# Patient Record
Sex: Male | Born: 1940
Health system: Southern US, Community
[De-identification: ages and names within clinical notes are randomized; demographics above are authoritative.]

## PROBLEM LIST (undated history)

## (undated) DIAGNOSIS — I351 Nonrheumatic aortic (valve) insufficiency: Secondary | ICD-10-CM

## (undated) DIAGNOSIS — K219 Gastro-esophageal reflux disease without esophagitis: Secondary | ICD-10-CM

## (undated) DIAGNOSIS — I10 Essential (primary) hypertension: Secondary | ICD-10-CM

## (undated) DIAGNOSIS — I4891 Unspecified atrial fibrillation: Secondary | ICD-10-CM

## (undated) DIAGNOSIS — E7800 Pure hypercholesterolemia, unspecified: Secondary | ICD-10-CM

## (undated) DIAGNOSIS — I471 Supraventricular tachycardia, unspecified: Secondary | ICD-10-CM

## (undated) DIAGNOSIS — I340 Nonrheumatic mitral (valve) insufficiency: Secondary | ICD-10-CM

## (undated) DIAGNOSIS — I499 Cardiac arrhythmia, unspecified: Secondary | ICD-10-CM

## (undated) DIAGNOSIS — K5790 Diverticulosis of intestine, part unspecified, without perforation or abscess without bleeding: Secondary | ICD-10-CM

## (undated) DIAGNOSIS — C61 Malignant neoplasm of prostate: Secondary | ICD-10-CM

## (undated) DIAGNOSIS — I517 Cardiomegaly: Secondary | ICD-10-CM

## (undated) DIAGNOSIS — I493 Ventricular premature depolarization: Secondary | ICD-10-CM

## (undated) DIAGNOSIS — M4800 Spinal stenosis, site unspecified: Secondary | ICD-10-CM

## (undated) HISTORY — PX: RETROPUBIC PROSTATECTOMY: SUR1055

## (undated) HISTORY — DX: Malignant neoplasm of prostate: C61

## (undated) HISTORY — PX: LUMBAR FUSION: SHX111

## (undated) HISTORY — DX: Pure hypercholesterolemia, unspecified: E78.00

## (undated) HISTORY — DX: Essential (primary) hypertension: I10

## (undated) HISTORY — DX: Diverticulosis of intestine, part unspecified, without perforation or abscess without bleeding: K57.90

## (undated) HISTORY — PX: OTHER SURGICAL HISTORY: SHX169

## (undated) HISTORY — DX: Gastro-esophageal reflux disease without esophagitis: K21.9

---
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
## 1967-09-02 HISTORY — PX: OTHER SURGICAL HISTORY: SHX169

## 2012-06-30 ENCOUNTER — Other Ambulatory Visit: Payer: Self-pay | Admitting: Internal Medicine

## 2012-06-30 ENCOUNTER — Other Ambulatory Visit: Payer: Self-pay | Admitting: *Deleted

## 2012-07-02 ENCOUNTER — Other Ambulatory Visit: Payer: Self-pay | Admitting: *Deleted

## 2012-07-02 MED ORDER — LISINOPRIL 20 MG PO TABS: 20.0000 mg | ORAL_TABLET | Freq: Every day | ORAL | Status: DC

## 2012-07-02 NOTE — Telephone Encounter (Signed)
Confirm with pharmacy - rx had been called in to pharmacy. (#30 with one refill)

## 2012-07-02 NOTE — Telephone Encounter (Signed)
2nd request for lisinopril 20 mg tab Sig: take one tablet each day  Appt. In Dec.

## 2012-08-20 ENCOUNTER — Encounter: Payer: Self-pay | Admitting: Internal Medicine

## 2012-08-20 ENCOUNTER — Ambulatory Visit (INDEPENDENT_AMBULATORY_CARE_PROVIDER_SITE_OTHER): Payer: BC Managed Care – PPO | Admitting: Internal Medicine

## 2012-08-20 VITALS — BP 120/68 | HR 60 | Temp 98.4°F | Ht 76.0 in | Wt 257.2 lb

## 2012-08-20 DIAGNOSIS — K219 Gastro-esophageal reflux disease without esophagitis: Secondary | ICD-10-CM

## 2012-08-20 DIAGNOSIS — C61 Malignant neoplasm of prostate: Secondary | ICD-10-CM

## 2012-08-20 DIAGNOSIS — E7800 Pure hypercholesterolemia, unspecified: Secondary | ICD-10-CM

## 2012-08-20 DIAGNOSIS — I10 Essential (primary) hypertension: Secondary | ICD-10-CM

## 2012-08-20 DIAGNOSIS — Z8546 Personal history of malignant neoplasm of prostate: Secondary | ICD-10-CM | POA: Insufficient documentation

## 2012-08-20 DIAGNOSIS — R5381 Other malaise: Secondary | ICD-10-CM

## 2012-08-20 DIAGNOSIS — R5383 Other fatigue: Secondary | ICD-10-CM

## 2012-08-20 NOTE — Patient Instructions (Addendum)
It was nice seeing you today.  I am glad you have been doing well.  Let me know if you need anything. 

## 2012-08-21 ENCOUNTER — Encounter: Payer: Self-pay | Admitting: Internal Medicine

## 2012-08-21 NOTE — Assessment & Plan Note (Signed)
S/p retropubic prostatectomy.  Follow PSA.

## 2012-08-21 NOTE — Assessment & Plan Note (Signed)
Blood pressure doing well.  Same meds.  Follow pressures.  Check metabolic panel with next labs.

## 2012-08-21 NOTE — Progress Notes (Signed)
  Subjective:    Patient ID: Edward James, male    DOB: March 24, 1941, 72 y.o.   MRN: [id number]  HPI 71 year old male with past history of prostate cancer s/p radical retropubic prostatectomy, hypercholesterolemia and hypertension who comes in today to follow up on these issues as well as for a complete physical exam.  He states he is doing well.  No chest pain or tightness.  No sob.  Taking his meds regularly.  On protonix and align and having no GI issues.  Bowels normal.  No acid reflux.   Past Medical History  Diagnosis Date  . Prostate cancer     s/p radical retropubic prostatectomy 1999  . Hypertension   . Hypercholesterolemia   . GERD (gastroesophageal reflux disease)   . Diverticulosis     Current Outpatient Prescriptions on File Prior to Visit  Medication Sig Dispense Refill  . lisinopril (PRINIVIL,ZESTRIL) 20 MG tablet Take 1 tablet (20 mg total) by mouth daily.  30 tablet  1  . pantoprazole (PROTONIX) 40 MG tablet Take 40 mg by mouth daily.      . simvastatin (ZOCOR) 10 MG tablet Take 10 mg by mouth at bedtime.      . triamterene-hydrochlorothiazide (MAXZIDE-25) 37.5-25 MG per tablet Take 1/2 tablet daily        Review of Systems Patient denies any headache, lightheadedness or dizziness.  No chest pain, tightness or palpitations.  No increased shortness of breath, cough or congestion.  No nausea or vomiting.  No acid reflux.  No abdominal pain or cramping.  No bowel change, such as diarrhea, constipation, BRBPR or melana.  No urine change.        Objective:   Physical Exam Filed Vitals:   08/20/12 1017  BP: 120/68  Pulse: 60  Temp: 98.4 F (48.45 C)   71 year old male in no acute distress.  HEENT:  Nares - clear.  Oropharynx - without lesions. NECK:  Supple.  Nontender.  No audible carotid bruit.  HEART:  Appears to be regular.  I/VI systolic murmur.  LUNGS:  No crackles or wheezing audible.  Respirations even and unlabored.   RADIAL PULSE:  Equal bilaterally.   ABDOMEN:  Soft.  Nontender.  Bowel sounds present and normal.  No audible abdominal bruit.  GU:  Normal descended testicles.  No palpable testicular nodules.  Hernia unchanged.  RECTAL:   Heme negative.   EXTREMITIES:  No increased edema present.  DP pulses palpable and equal bilaterally.          Assessment & Plan:  CARDIOVASCULAR.  Currently asymptomatic.    GI.  Colonoscopy 02/21/08 revealed diverticulosis.  Recommended follow up in 10 years. IFOB - given.    HERNIA.  Saw Dr Lonna Cobb.  Decided - observation only.  No pain or problems.  Follow.   HEALTH MAINTENANCE.  Physical today.  Check PSA with next labs.  Last PSA .64 - 04/22/12.  Colonoscopy as outlined.

## 2012-08-21 NOTE — Assessment & Plan Note (Signed)
Lipid profile 04/22/12 revealed total cholesterol 135, HDL 41, LDL 78 and triglycerides 78.  Continue current med regimen.  Low cholesterol diet and exercise.  Follow.

## 2012-08-21 NOTE — Assessment & Plan Note (Signed)
On protonix.  Currently asymptomatic.    

## 2012-10-05 ENCOUNTER — Ambulatory Visit (INDEPENDENT_AMBULATORY_CARE_PROVIDER_SITE_OTHER): Payer: Medicare Other | Admitting: Adult Health

## 2012-10-05 ENCOUNTER — Encounter: Payer: Self-pay | Admitting: Adult Health

## 2012-10-05 ENCOUNTER — Telehealth: Payer: Self-pay | Admitting: Internal Medicine

## 2012-10-05 VITALS — BP 124/74 | HR 68 | Temp 98.2°F | Resp 14 | Wt 256.0 lb

## 2012-10-05 DIAGNOSIS — J40 Bronchitis, not specified as acute or chronic: Secondary | ICD-10-CM

## 2012-10-05 MED ORDER — ALBUTEROL SULFATE HFA 108 (90 BASE) MCG/ACT IN AERS: 2.0000 | INHALATION_SPRAY | Freq: Four times a day (QID) | RESPIRATORY_TRACT | Status: DC | PRN

## 2012-10-05 MED ORDER — FLUTICASONE PROPIONATE 50 MCG/ACT NA SUSP: 2.0000 | Freq: Every day | NASAL | Status: DC

## 2012-10-05 MED ORDER — AZITHROMYCIN 250 MG PO TABS: ORAL_TABLET | ORAL | Status: DC

## 2012-10-05 NOTE — Patient Instructions (Addendum)
  Please start your antibiotic today. You will take this for a total of 5 days.  Also start the flonase nasal spray. Two sprays in each nostril daily.  Start the albuterol inhaler - 2 puffs into the lungs every 6 hours as needed for wheezing, cough or shortness of breath.  Continue the cough syrup you have been taking.  If you are not feeling any better within 3-4 days please call the office.

## 2012-10-05 NOTE — Progress Notes (Signed)
  Subjective:    Patient ID: Edward James, male    DOB: 05/18/1941, 72 y.o.   MRN: [id number]  HPI  Patient is a pleasant 72 y/o male with 7 day hx of sinus pressure, drainage, yellow mucus, wheezing, coughing. He has had chills and low grade fever. Pt has tried Mucinex bid and OTC cough syrup with mild relief. He uses saline nasal spray. He denies CP, shortness of breath.   Current Outpatient Prescriptions on File Prior to Visit  Medication Sig Dispense Refill  . aspirin 81 MG tablet Take 81 mg by mouth daily.      Marland Kitchen lisinopril (PRINIVIL,ZESTRIL) 20 MG tablet Take 1 tablet (20 mg total) by mouth daily.  30 tablet  1  . simvastatin (ZOCOR) 10 MG tablet Take 10 mg by mouth at bedtime.      . triamterene-hydrochlorothiazide (MAXZIDE-25) 37.5-25 MG per tablet Take 1/2 tablet daily      . albuterol (PROVENTIL HFA;VENTOLIN HFA) 108 (90 BASE) MCG/ACT inhaler Inhale 2 puffs into the lungs every 6 (six) hours as needed for wheezing.  1 Inhaler  0      Review of Systems  Constitutional: Positive for fever and chills. Negative for appetite change.  HENT: Positive for sore throat, rhinorrhea, postnasal drip and sinus pressure.   Respiratory: Positive for cough and wheezing.   Cardiovascular: Negative for chest pain.  Gastrointestinal: Negative.   Genitourinary: Negative.   Neurological: Negative.   Psychiatric/Behavioral: Negative.    BP 124/74  Pulse 68  Temp 98.2 F (36.8 C) (Oral)  Resp 14  Wt 256 lb (116.121 kg)  SpO2 96%     Objective:   Physical Exam  Constitutional: He is oriented to person, place, and time. He appears well-developed and well-nourished. No distress.  Cardiovascular: Normal rate, regular rhythm and normal heart sounds.   No murmur heard. Pulmonary/Chest: Effort normal. He has wheezes.       Bilateral rhonchi does not clear with coughing.  Abdominal: Soft. Bowel sounds are normal.  Lymphadenopathy:    He has no cervical adenopathy.  Neurological: He is alert  and oriented to person, place, and time.  Skin: Skin is warm and dry.  Psychiatric: He has a normal mood and affect. His behavior is normal. Thought content normal.       Assessment & Plan:

## 2012-10-05 NOTE — Assessment & Plan Note (Addendum)
Start Azithromycin and albuterol inhaler. Also ordered flonase. Continue with OTC cough medication and saline spray. RTC if not improvement within 3-4 days.

## 2012-10-05 NOTE — Telephone Encounter (Signed)
Patient was worked in today at 4:00pm with Raquel.

## 2012-10-05 NOTE — Telephone Encounter (Signed)
Patient Information:  Caller Name: Edward James  Phone: [phone]  Patient: Edward James, Edward James  Gender: Male  DOB: 20-Sep-1940  Age: 72 Years  PCP: [facility]  Office Follow Up:  Does the office need to follow up with this patient?: No  Instructions For The Office: N/A   Symptoms  Reason For Call & Symptoms: Pt is calling for an appt for congestion/sinus drainage. Onset 1 week ago. Pt was hoping MD would call in abx. RN advised appt.  Reviewed Health History In EMR: Yes  Reviewed Medications In EMR: Yes  Reviewed Allergies In EMR: Yes  Reviewed Surgeries / Procedures: Yes  Date of Onset of Symptoms: 09/28/2012  Guideline(s) Used:  Sinus Pain and Congestion  Disposition Per Guideline:   See Today or Tomorrow in Office  Reason For Disposition Reached:   Using nasal washes and pain medicine > 24 hours and sinus pain (lower forehead, cheekbone, or eye) persists  Advice Given:  N/A  Appointment Scheduled:  10/05/2012 16:00:00 Appointment Scheduled Provider:  Orville Govern

## 2012-10-21 ENCOUNTER — Other Ambulatory Visit (INDEPENDENT_AMBULATORY_CARE_PROVIDER_SITE_OTHER): Payer: Medicare Other

## 2012-10-21 DIAGNOSIS — I10 Essential (primary) hypertension: Secondary | ICD-10-CM

## 2012-10-21 DIAGNOSIS — E7800 Pure hypercholesterolemia, unspecified: Secondary | ICD-10-CM

## 2012-10-21 DIAGNOSIS — R5383 Other fatigue: Secondary | ICD-10-CM

## 2012-10-21 DIAGNOSIS — C61 Malignant neoplasm of prostate: Secondary | ICD-10-CM

## 2012-10-21 LAB — LIPID PANEL
HDL: 40.6 mg/dL (ref >39.00)
Total CHOL/HDL Ratio: 3
Triglycerides: 62 mg/dL (ref 0.0–149.0)
VLDL: 12.4 mg/dL (ref 0.0–40.0)

## 2012-10-21 LAB — CBC WITH DIFFERENTIAL/PLATELET
Basophils Relative: 0.7 % (ref 0.0–3.0)
Eosinophils Relative: 1.6 % (ref 0.0–5.0)
Lymphocytes Relative: 47.2 % — ABNORMAL HIGH (ref 12.0–46.0)
Neutrophils Relative %: 42.3 % — ABNORMAL LOW (ref 43.0–77.0)
Platelets: 231 10*3/uL (ref 150.0–400.0)
RBC: 5.09 Mil/uL (ref 4.22–5.81)
WBC: 5.9 10*3/uL (ref 4.5–10.5)

## 2012-10-21 LAB — HEPATIC FUNCTION PANEL
ALT: 23 U/L (ref 0–53)
AST: 22 U/L (ref 0–37)
Albumin: 3.9 g/dL (ref 3.5–5.2)
Total Bilirubin: 1.1 mg/dL (ref 0.3–1.2)

## 2012-10-21 LAB — BASIC METABOLIC PANEL
BUN: 17 mg/dL (ref 6–23)
Chloride: 107 mEq/L (ref 96–112)
Glucose, Bld: 92 mg/dL (ref 70–99)
Potassium: 4.8 mEq/L (ref 3.5–5.1)
Sodium: 140 mEq/L (ref 135–145)

## 2012-10-22 LAB — PSA, MEDICARE: PSA: 0.38 ng/ml (ref 0.10–4.00)

## 2012-10-22 LAB — TSH: TSH: 2.12 u[IU]/mL (ref 0.35–5.50)

## 2012-10-25 ENCOUNTER — Telehealth: Payer: Self-pay | Admitting: Internal Medicine

## 2012-10-25 NOTE — Telephone Encounter (Signed)
Pt notified of lab results via mychart. 

## 2012-10-28 ENCOUNTER — Encounter: Payer: Self-pay | Admitting: Internal Medicine

## 2012-10-28 ENCOUNTER — Ambulatory Visit (INDEPENDENT_AMBULATORY_CARE_PROVIDER_SITE_OTHER): Payer: Medicare Other | Admitting: Internal Medicine

## 2012-10-28 VITALS — BP 124/70 | HR 62 | Temp 97.7°F | Ht 76.0 in | Wt 256.5 lb

## 2012-10-28 DIAGNOSIS — E7800 Pure hypercholesterolemia, unspecified: Secondary | ICD-10-CM

## 2012-10-28 DIAGNOSIS — I10 Essential (primary) hypertension: Secondary | ICD-10-CM

## 2012-10-28 DIAGNOSIS — C61 Malignant neoplasm of prostate: Secondary | ICD-10-CM

## 2012-10-28 DIAGNOSIS — K219 Gastro-esophageal reflux disease without esophagitis: Secondary | ICD-10-CM

## 2012-11-01 ENCOUNTER — Encounter: Payer: Self-pay | Admitting: Internal Medicine

## 2012-11-01 NOTE — Progress Notes (Signed)
Subjective:    Patient ID: Edward James, male    DOB: June 20, 1941, 72 y.o.   MRN: [id number]  HPI 72 year old male with past history of prostate cancer s/p radical retropubic prostatectomy, hypercholesterolemia and hypertension who comes in today for a scheduled follow up.  He states he is doing well.  No chest pain or tightness.  No sob.  Taking his meds regularly.  On protonix and align and having no GI issues.  Bowels normal.  No acid reflux.   Past Medical History  Diagnosis Date  . Prostate cancer     s/p radical retropubic prostatectomy 1999  . Hypertension   . Hypercholesterolemia   . GERD (gastroesophageal reflux disease)   . Diverticulosis     Current Outpatient Prescriptions on File Prior to Visit  Medication Sig Dispense Refill  . aspirin 81 MG tablet Take 81 mg by mouth daily.      Marland Kitchen azithromycin (ZITHROMAX) 250 MG tablet Take 2 tablets on the first day then take 1 tablet daily for the next 4 days.  6 tablet  0  . fluticasone (FLONASE) 50 MCG/ACT nasal spray Place 2 sprays into the nose daily.  16 g  6  . lisinopril (PRINIVIL,ZESTRIL) 20 MG tablet Take 1 tablet (20 mg total) by mouth daily.  30 tablet  1  . pantoprazole (PROTONIX) 40 MG tablet Take 40 mg by mouth daily.      . simvastatin (ZOCOR) 10 MG tablet Take 10 mg by mouth at bedtime.      . triamterene-hydrochlorothiazide (MAXZIDE-25) 37.5-25 MG per tablet Take 1/2 tablet daily      . albuterol (PROVENTIL HFA;VENTOLIN HFA) 108 (90 BASE) MCG/ACT inhaler Inhale 2 puffs into the lungs every 6 (six) hours as needed for wheezing.  1 Inhaler  0  . dextromethorphan (COUGH DM) 30 MG/5ML liquid Take 60 mg by mouth as needed.      Marland Kitchen guaiFENesin (MUCUS RELIEF ER) 600 MG 12 hr tablet Take 600 mg by mouth 2 (two) times daily.       No current facility-administered medications on file prior to visit.    Review of Systems Patient denies any headache, lightheadedness or dizziness.  No chest pain, tightness or palpitations.  No  increased shortness of breath, cough or congestion.  Previous congestion symptoms resolved. No nausea or vomiting.  No acid reflux.  No abdominal pain or cramping.  No bowel change, such as diarrhea, constipation, BRBPR or melana.  No urine change.        Objective:   Physical Exam  Filed Vitals:   10/28/12 1404  BP: 124/70  Pulse: 62  Temp: 97.7 F (2.110 C)   72 year old male in no acute distress.  HEENT:  Nares - clear.  Oropharynx - without lesions. NECK:  Supple.  Nontender.  No audible carotid bruit.  HEART:  Appears to be regular.  I/VI systolic murmur.  LUNGS:  No crackles or wheezing audible.  Respirations even and unlabored.   RADIAL PULSE:  Equal bilaterally.  ABDOMEN:  Soft.  Nontender.  Bowel sounds present and normal.  No audible abdominal bruit.   EXTREMITIES:  No increased edema present.  DP pulses palpable and equal bilaterally.          Assessment & Plan:  CARDIOVASCULAR.  Currently asymptomatic.    GI.  Colonoscopy 02/21/08 revealed diverticulosis.  Recommended follow up in 10 years.   HERNIA.  Saw Dr Lonna Cobb.  Decided - observation only.  No pain  or problems.  Follow.   HEALTH MAINTENANCE.  Physical last visit.  Last PSA  .38 - 10/21/12..  Colonoscopy as outlined.

## 2012-11-01 NOTE — Assessment & Plan Note (Signed)
Lipid profile 10/21/12 revealed total cholesterol 133, HDL 40, LDL 80 and triglycerides 62.  Continue current med regimen.  Low cholesterol diet and exercise.  Follow.

## 2012-11-01 NOTE — Assessment & Plan Note (Signed)
Blood pressure doing well.  Same meds.  Follow pressures.  Follow metabolic panel.   

## 2012-11-01 NOTE — Assessment & Plan Note (Signed)
On protonix.  Currently asymptomatic.    

## 2012-11-01 NOTE — Assessment & Plan Note (Signed)
S/p retropubic prostatectomy.   PSA 10/21/12 - .38.

## 2013-02-18 ENCOUNTER — Ambulatory Visit: Payer: BC Managed Care – PPO | Admitting: Internal Medicine

## 2013-03-14 ENCOUNTER — Other Ambulatory Visit: Payer: Self-pay | Admitting: Internal Medicine

## 2013-03-29 ENCOUNTER — Other Ambulatory Visit: Payer: Self-pay | Admitting: *Deleted

## 2013-03-29 MED ORDER — PANTOPRAZOLE SODIUM 40 MG PO TBEC: 40.0000 mg | DELAYED_RELEASE_TABLET | Freq: Every day | ORAL | Status: DC

## 2013-04-06 ENCOUNTER — Other Ambulatory Visit: Payer: Self-pay

## 2013-04-20 ENCOUNTER — Other Ambulatory Visit (INDEPENDENT_AMBULATORY_CARE_PROVIDER_SITE_OTHER): Payer: Medicare Other

## 2013-04-20 DIAGNOSIS — I10 Essential (primary) hypertension: Secondary | ICD-10-CM

## 2013-04-20 DIAGNOSIS — E7800 Pure hypercholesterolemia, unspecified: Secondary | ICD-10-CM

## 2013-04-20 LAB — BASIC METABOLIC PANEL
CO2: 29 mEq/L (ref 19–32)
Calcium: 9.4 mg/dL (ref 8.4–10.5)
Chloride: 106 mEq/L (ref 96–112)
Glucose, Bld: 90 mg/dL (ref 70–99)
Potassium: 4.6 mEq/L (ref 3.5–5.1)
Sodium: 137 mEq/L (ref 135–145)

## 2013-04-20 LAB — HEPATIC FUNCTION PANEL
ALT: 24 U/L (ref 0–53)
Albumin: 4 g/dL (ref 3.5–5.2)
Alkaline Phosphatase: 48 U/L (ref 39–117)
Bilirubin, Direct: 0.2 mg/dL (ref 0.0–0.3)
Total Protein: 6.6 g/dL (ref 6.0–8.3)

## 2013-04-20 LAB — LIPID PANEL
Cholesterol: 140 mg/dL (ref 0–200)
LDL Cholesterol: 82 mg/dL (ref 0–99)
Triglycerides: 95 mg/dL (ref 0.0–149.0)
VLDL: 19 mg/dL (ref 0.0–40.0)

## 2013-04-25 ENCOUNTER — Other Ambulatory Visit: Payer: Medicare Other

## 2013-04-28 ENCOUNTER — Encounter: Payer: Self-pay | Admitting: Internal Medicine

## 2013-04-28 ENCOUNTER — Ambulatory Visit (INDEPENDENT_AMBULATORY_CARE_PROVIDER_SITE_OTHER): Payer: Medicare Other | Admitting: Internal Medicine

## 2013-04-28 VITALS — BP 120/70 | HR 63 | Temp 97.6°F | Ht 76.0 in | Wt 254.5 lb

## 2013-04-28 DIAGNOSIS — K219 Gastro-esophageal reflux disease without esophagitis: Secondary | ICD-10-CM

## 2013-04-28 DIAGNOSIS — E7800 Pure hypercholesterolemia, unspecified: Secondary | ICD-10-CM

## 2013-04-28 DIAGNOSIS — C61 Malignant neoplasm of prostate: Secondary | ICD-10-CM

## 2013-04-28 DIAGNOSIS — I10 Essential (primary) hypertension: Secondary | ICD-10-CM

## 2013-04-28 NOTE — Progress Notes (Signed)
  Subjective:    Patient ID: Edward James, male    DOB: 04/28/1941, 72 y.o.   MRN: [id number]  HPI 72 year old male with past history of prostate cancer s/p radical retropubic prostatectomy, hypercholesterolemia and hypertension who comes in today for a scheduled follow up.  He states he is doing well.  No chest pain or tightness.  No sob.  Taking his meds regularly.  On protonix and align and having no GI issues.  Bowels normal.  No acid reflux.    Past Medical History  Diagnosis Date  . Prostate cancer     s/p radical retropubic prostatectomy 1999  . Hypertension   . Hypercholesterolemia   . GERD (gastroesophageal reflux disease)   . Diverticulosis     Current Outpatient Prescriptions on File Prior to Visit  Medication Sig Dispense Refill  . albuterol (PROVENTIL HFA;VENTOLIN HFA) 108 (90 BASE) MCG/ACT inhaler Inhale 2 puffs into the lungs every 6 (six) hours as needed for wheezing.  1 Inhaler  0  . aspirin 81 MG tablet Take 81 mg by mouth daily.      . fluticasone (FLONASE) 50 MCG/ACT nasal spray Place 2 sprays into the nose daily.  16 g  6  . lisinopril (PRINIVIL,ZESTRIL) 20 MG tablet Take 1 tablet (20 mg total) by mouth daily.  30 tablet  1  . pantoprazole (PROTONIX) 40 MG tablet Take 1 tablet (40 mg total) by mouth daily.  30 tablet  5  . simvastatin (ZOCOR) 10 MG tablet Take 10 mg by mouth at bedtime.      . triamterene-hydrochlorothiazide (MAXZIDE-25) 37.5-25 MG per tablet TAKE ONE-HALF TABLET BY MOUTH DAILY  51 tablet  3   No current facility-administered medications on file prior to visit.    Review of Systems Patient denies any headache, lightheadedness or dizziness.  No chest pain, tightness or palpitations.  No increased shortness of breath, cough or congestion.   No nausea or vomiting.  No acid reflux.  No abdominal pain or cramping.  No bowel change, such as diarrhea, constipation, BRBPR or melana.  No urine change.  Overall feels he is doing well.        Objective:   Physical Exam  Filed Vitals:   04/28/13 0859  BP: 120/70  Pulse: 63  Temp: 97.6 F (87.53 C)   72 year old male in no acute distress.  HEENT:  Nares - clear.  Oropharynx - without lesions. NECK:  Supple.  Nontender.  No audible carotid bruit.  HEART:  Appears to be regular.  I/VI systolic murmur.  LUNGS:  No crackles or wheezing audible.  Respirations even and unlabored.   RADIAL PULSE:  Equal bilaterally.  ABDOMEN:  Soft.  Nontender.  Bowel sounds present and normal.  No audible abdominal bruit.   EXTREMITIES:  No increased edema present.  DP pulses palpable and equal bilaterally.          Assessment & Plan:  CARDIOVASCULAR.  Currently asymptomatic.    GI.  Colonoscopy 02/21/08 revealed diverticulosis.  Recommended follow up in 10 years.   HERNIA.  Saw Dr Lonna Cobb.  Decided - observation only.  No pain or problems.  Follow.   HEALTH MAINTENANCE.  Physical last visit.  Last PSA  .38 - 10/21/12..  Colonoscopy as outlined.

## 2013-04-30 ENCOUNTER — Telehealth: Payer: Self-pay | Admitting: Internal Medicine

## 2013-04-30 DIAGNOSIS — C61 Malignant neoplasm of prostate: Secondary | ICD-10-CM

## 2013-04-30 NOTE — Telephone Encounter (Signed)
Edward James did not get a PSA with his labs.  His wife has an appointment 05/03/13 at 9:00.  He was wanting to ride in with her and get his labs while she is here.  I have ordered the lab.  Please put on the lab schedule.  Thanks.

## 2013-05-02 ENCOUNTER — Encounter: Payer: Self-pay | Admitting: Internal Medicine

## 2013-05-02 NOTE — Assessment & Plan Note (Signed)
On protonix.  Currently asymptomatic.    

## 2013-05-02 NOTE — Assessment & Plan Note (Signed)
Blood pressure doing well.  Same meds.  Follow pressures.  Follow metabolic panel.   

## 2013-05-02 NOTE — Assessment & Plan Note (Signed)
S/p retropubic prostatectomy.   PSA 10/21/12 - .38.

## 2013-05-02 NOTE — Assessment & Plan Note (Signed)
Lipid profile 04/20/13 revealed total cholesterol 140, HDL 39, LDL 82 and triglycerides 95.  Continue current med regimen.  Low cholesterol diet and exercise.  Follow.

## 2013-05-03 ENCOUNTER — Other Ambulatory Visit (INDEPENDENT_AMBULATORY_CARE_PROVIDER_SITE_OTHER): Payer: Medicare Other

## 2013-05-03 ENCOUNTER — Encounter: Payer: Self-pay | Admitting: Internal Medicine

## 2013-05-03 ENCOUNTER — Other Ambulatory Visit: Payer: Self-pay | Admitting: Internal Medicine

## 2013-05-03 DIAGNOSIS — I10 Essential (primary) hypertension: Secondary | ICD-10-CM

## 2013-05-03 DIAGNOSIS — C61 Malignant neoplasm of prostate: Secondary | ICD-10-CM

## 2013-05-03 DIAGNOSIS — Z125 Encounter for screening for malignant neoplasm of prostate: Secondary | ICD-10-CM

## 2013-05-03 DIAGNOSIS — E7800 Pure hypercholesterolemia, unspecified: Secondary | ICD-10-CM

## 2013-05-03 NOTE — Progress Notes (Signed)
Orders placed for f/u labs.  

## 2013-05-03 NOTE — Telephone Encounter (Signed)
Appointment made

## 2013-06-21 ENCOUNTER — Ambulatory Visit (INDEPENDENT_AMBULATORY_CARE_PROVIDER_SITE_OTHER): Payer: Medicare Other

## 2013-06-21 DIAGNOSIS — Z23 Encounter for immunization: Secondary | ICD-10-CM

## 2013-08-16 ENCOUNTER — Other Ambulatory Visit: Payer: Self-pay | Admitting: Internal Medicine

## 2013-08-29 ENCOUNTER — Other Ambulatory Visit: Payer: Self-pay | Admitting: Internal Medicine

## 2013-09-21 ENCOUNTER — Other Ambulatory Visit: Payer: Self-pay | Admitting: Internal Medicine

## 2013-10-28 ENCOUNTER — Other Ambulatory Visit: Payer: Medicare Other

## 2013-11-01 ENCOUNTER — Other Ambulatory Visit (INDEPENDENT_AMBULATORY_CARE_PROVIDER_SITE_OTHER): Payer: Medicare Other

## 2013-11-01 ENCOUNTER — Ambulatory Visit (INDEPENDENT_AMBULATORY_CARE_PROVIDER_SITE_OTHER): Payer: Medicare Other | Admitting: Internal Medicine

## 2013-11-01 ENCOUNTER — Encounter: Payer: Self-pay | Admitting: Internal Medicine

## 2013-11-01 VITALS — BP 142/82 | HR 59 | Temp 97.9°F | Resp 14 | Ht 77.5 in | Wt 261.8 lb

## 2013-11-01 DIAGNOSIS — Z8546 Personal history of malignant neoplasm of prostate: Secondary | ICD-10-CM

## 2013-11-01 DIAGNOSIS — E7800 Pure hypercholesterolemia, unspecified: Secondary | ICD-10-CM

## 2013-11-01 DIAGNOSIS — I10 Essential (primary) hypertension: Secondary | ICD-10-CM

## 2013-11-01 DIAGNOSIS — Z23 Encounter for immunization: Secondary | ICD-10-CM

## 2013-11-01 DIAGNOSIS — Z0000 Encounter for general adult medical examination without abnormal findings: Secondary | ICD-10-CM

## 2013-11-01 DIAGNOSIS — C61 Malignant neoplasm of prostate: Secondary | ICD-10-CM

## 2013-11-01 DIAGNOSIS — K219 Gastro-esophageal reflux disease without esophagitis: Secondary | ICD-10-CM

## 2013-11-01 DIAGNOSIS — Z1211 Encounter for screening for malignant neoplasm of colon: Secondary | ICD-10-CM

## 2013-11-01 LAB — LIPID PANEL
Cholesterol: 146 mg/dL (ref 0–200)
HDL: 44.1 mg/dL (ref >39.00)
LDL Cholesterol: 90 mg/dL (ref 0–99)
Total CHOL/HDL Ratio: 3
Triglycerides: 58 mg/dL (ref 0.0–149.0)
VLDL: 11.6 mg/dL (ref 0.0–40.0)

## 2013-11-01 LAB — CBC WITH DIFFERENTIAL/PLATELET
BASOS ABS: 0 10*3/uL (ref 0.0–0.1)
Basophils Relative: 0.3 % (ref 0.0–3.0)
EOS ABS: 0.1 10*3/uL (ref 0.0–0.7)
Eosinophils Relative: 1.4 % (ref 0.0–5.0)
HEMATOCRIT: 47.3 % (ref 39.0–52.0)
Hemoglobin: 15.5 g/dL (ref 13.0–17.0)
LYMPHS ABS: 2.8 10*3/uL (ref 0.7–4.0)
Lymphocytes Relative: 38.9 % (ref 12.0–46.0)
MCHC: 32.8 g/dL (ref 30.0–36.0)
MCV: 88.2 fl (ref 78.0–100.0)
MONOS PCT: 9.3 % (ref 3.0–12.0)
Monocytes Absolute: 0.7 10*3/uL (ref 0.1–1.0)
Neutro Abs: 3.6 10*3/uL (ref 1.4–7.7)
Neutrophils Relative %: 50.1 % (ref 43.0–77.0)
PLATELETS: 241 10*3/uL (ref 150.0–400.0)
RBC: 5.36 Mil/uL (ref 4.22–5.81)
RDW: 12.6 % (ref 11.5–14.6)
WBC: 7.1 10*3/uL (ref 4.5–10.5)

## 2013-11-01 LAB — BASIC METABOLIC PANEL
BUN: 23 mg/dL (ref 6–23)
CALCIUM: 9.4 mg/dL (ref 8.4–10.5)
CO2: 28 mEq/L (ref 19–32)
Chloride: 103 mEq/L (ref 96–112)
Creatinine, Ser: 0.9 mg/dL (ref 0.4–1.5)
GFR: 86.22 mL/min (ref >60.00)
GLUCOSE: 90 mg/dL (ref 70–99)
POTASSIUM: 4.3 meq/L (ref 3.5–5.1)
Sodium: 136 mEq/L (ref 135–145)

## 2013-11-01 LAB — HEPATIC FUNCTION PANEL
ALT: 30 U/L (ref 0–53)
AST: 26 U/L (ref 0–37)
Albumin: 4.1 g/dL (ref 3.5–5.2)
Alkaline Phosphatase: 54 U/L (ref 39–117)
BILIRUBIN DIRECT: 0.2 mg/dL (ref 0.0–0.3)
TOTAL PROTEIN: 7 g/dL (ref 6.0–8.3)
Total Bilirubin: 1.1 mg/dL (ref 0.3–1.2)

## 2013-11-01 LAB — PSA, MEDICARE: PSA: 0.49 ng/ml (ref 0.10–4.00)

## 2013-11-01 NOTE — Progress Notes (Signed)
Subjective:    Patient ID: Edward James, male    DOB: Jan 22, 1941, 73 y.o.   MRN: [id number]  HPI 73 year old male with past history of prostate cancer s/p radical retropubic prostatectomy, hypercholesterolemia and hypertension who comes in today to follow up on these issues as well as for a complete physical exam.   He states he is doing well.  No chest pain or tightness.  No sob.  Taking his meds regularly.  On protonix and align and having no GI issues.  Bowels normal.  No acid reflux.  Overall feels good.    Past Medical History  Diagnosis Date  . Prostate cancer     s/p radical retropubic prostatectomy 1999  . Hypertension   . Hypercholesterolemia   . GERD (gastroesophageal reflux disease)   . Diverticulosis     Current Outpatient Prescriptions on File Prior to Visit  Medication Sig Dispense Refill  . aspirin 81 MG tablet Take 81 mg by mouth daily.      . fluticasone (FLONASE) 50 MCG/ACT nasal spray Place 2 sprays into the nose daily.  16 g  6  . lisinopril (PRINIVIL,ZESTRIL) 20 MG tablet TAKE ONE (1) TABLET EACH DAY  90 tablet  0  . pantoprazole (PROTONIX) 40 MG tablet TAKE ONE (1) TABLET EACH DAY  90 tablet  1  . simvastatin (ZOCOR) 10 MG tablet TAKE ONE (1) TABLET EACH DAY  90 tablet  1  . triamterene-hydrochlorothiazide (MAXZIDE-25) 37.5-25 MG per tablet TAKE ONE-HALF TABLET BY MOUTH DAILY  51 tablet  3  . albuterol (PROVENTIL HFA;VENTOLIN HFA) 108 (90 BASE) MCG/ACT inhaler Inhale 2 puffs into the lungs every 6 (six) hours as needed for wheezing.  1 Inhaler  0   No current facility-administered medications on file prior to visit.    Review of Systems Patient denies any headache, lightheadedness or dizziness.  No chest pain, tightness or palpitations.  No increased shortness of breath, cough or congestion.   No nausea or vomiting.  No acid reflux.  No abdominal pain or cramping.  No bowel change, such as diarrhea, constipation, BRBPR or melana.  No urine change.  Overall feels  he is doing well.        Objective:   Physical Exam  Filed Vitals:   11/01/13 0830  BP: 142/82  Pulse: 59  Temp: 97.9 F (36.6 C)  Resp: 14   Blood pressure recheck:  134/78, pulse 90  73 year old male in no acute distress.  HEENT:  Nares - clear.  Oropharynx - without lesions. NECK:  Supple.  Nontender.  No audible carotid bruit.  HEART:  Appears to be regular.   LUNGS:  No crackles or wheezing audible.  Respirations even and unlabored.   RADIAL PULSE:  Equal bilaterally.  ABDOMEN:  Soft.  Nontender.  Bowel sounds present and normal.  No audible abdominal bruit.  GU:  Normal descended testicles.  No palpable testicular nodules.   RECTAL:  Could not appreciate any masses.  Heme negative.   EXTREMITIES:  No increased edema present.  DP pulses palpable and equal bilaterally.         Assessment & Plan:  CARDIOVASCULAR.  Currently asymptomatic.    GI.  Colonoscopy 02/21/08 revealed diverticulosis.  Recommended follow up in 10 years.   HERNIA.  Saw Dr Bernardo Heater.  Decided - observation only.  No pain or problems.  Follow.   HEALTH MAINTENANCE.  Physical today.  Last PSA  .47 - 05/03/13.Edward James today.  Colonoscopy as outlined.  IFOB today.

## 2013-11-01 NOTE — Progress Notes (Signed)
Pre visit review using our clinic review tool, if applicable. No additional management support is needed unless otherwise documented below in the visit note. 

## 2013-11-05 ENCOUNTER — Encounter: Payer: Self-pay | Admitting: Internal Medicine

## 2013-11-05 NOTE — Assessment & Plan Note (Signed)
Continue current med regimen.  Low cholesterol diet and exercise.  Follow.   

## 2013-11-05 NOTE — Assessment & Plan Note (Signed)
S/p retropubic prostatectomy.   PSA 05/03/13 - .47.  Recheck today.

## 2013-11-05 NOTE — Assessment & Plan Note (Signed)
On protonix.  Currently asymptomatic.    

## 2013-11-05 NOTE — Assessment & Plan Note (Signed)
Blood pressure doing well.  Same meds.  Follow pressures.  Follow metabolic panel.   

## 2013-11-28 ENCOUNTER — Other Ambulatory Visit: Payer: Self-pay | Admitting: Internal Medicine

## 2014-02-13 ENCOUNTER — Other Ambulatory Visit: Payer: Self-pay | Admitting: Internal Medicine

## 2014-02-25 ENCOUNTER — Other Ambulatory Visit: Payer: Self-pay | Admitting: Internal Medicine

## 2014-03-14 ENCOUNTER — Other Ambulatory Visit (INDEPENDENT_AMBULATORY_CARE_PROVIDER_SITE_OTHER): Payer: Medicare Other

## 2014-03-14 DIAGNOSIS — Z1211 Encounter for screening for malignant neoplasm of colon: Secondary | ICD-10-CM

## 2014-03-14 LAB — FECAL OCCULT BLOOD, IMMUNOCHEMICAL: FECAL OCCULT BLD: POSITIVE — AB

## 2014-03-15 ENCOUNTER — Telehealth: Payer: Self-pay | Admitting: Internal Medicine

## 2014-03-15 ENCOUNTER — Encounter: Payer: Self-pay | Admitting: Internal Medicine

## 2014-03-15 DIAGNOSIS — R195 Other fecal abnormalities: Secondary | ICD-10-CM

## 2014-03-15 NOTE — Telephone Encounter (Signed)
Pt notified of heme positive stool.  Referred to Dr Laurence Spates [facility] gastroenterology.

## 2014-03-20 ENCOUNTER — Other Ambulatory Visit: Payer: Self-pay | Admitting: Internal Medicine

## 2014-04-11 LAB — HM COLONOSCOPY

## 2014-04-26 ENCOUNTER — Telehealth: Payer: Self-pay | Admitting: *Deleted

## 2014-04-26 DIAGNOSIS — C61 Malignant neoplasm of prostate: Secondary | ICD-10-CM

## 2014-04-26 DIAGNOSIS — E7800 Pure hypercholesterolemia, unspecified: Secondary | ICD-10-CM

## 2014-04-26 DIAGNOSIS — I10 Essential (primary) hypertension: Secondary | ICD-10-CM

## 2014-04-26 DIAGNOSIS — K219 Gastro-esophageal reflux disease without esophagitis: Secondary | ICD-10-CM

## 2014-04-26 NOTE — Telephone Encounter (Signed)
Pt is coming in tomorrow what labs and dx?  

## 2014-04-26 NOTE — Telephone Encounter (Signed)
Orders placed for labs tomorrow.

## 2014-04-27 ENCOUNTER — Other Ambulatory Visit (INDEPENDENT_AMBULATORY_CARE_PROVIDER_SITE_OTHER): Payer: Medicare Other

## 2014-04-27 DIAGNOSIS — E7800 Pure hypercholesterolemia, unspecified: Secondary | ICD-10-CM

## 2014-04-27 DIAGNOSIS — C61 Malignant neoplasm of prostate: Secondary | ICD-10-CM

## 2014-04-27 DIAGNOSIS — I10 Essential (primary) hypertension: Secondary | ICD-10-CM

## 2014-04-27 LAB — HEPATIC FUNCTION PANEL
ALT: 25 U/L (ref 0–53)
AST: 24 U/L (ref 0–37)
Albumin: 3.9 g/dL (ref 3.5–5.2)
Alkaline Phosphatase: 49 U/L (ref 39–117)
BILIRUBIN TOTAL: 1 mg/dL (ref 0.2–1.2)
Bilirubin, Direct: 0.2 mg/dL (ref 0.0–0.3)
Total Protein: 6.6 g/dL (ref 6.0–8.3)

## 2014-04-27 LAB — BASIC METABOLIC PANEL
BUN: 25 mg/dL — AB (ref 6–23)
CALCIUM: 9.2 mg/dL (ref 8.4–10.5)
CO2: 29 mEq/L (ref 19–32)
CREATININE: 0.9 mg/dL (ref 0.4–1.5)
Chloride: 106 mEq/L (ref 96–112)
GFR: 85.03 mL/min (ref >60.00)
Glucose, Bld: 90 mg/dL (ref 70–99)
Potassium: 4.5 mEq/L (ref 3.5–5.1)
Sodium: 138 mEq/L (ref 135–145)

## 2014-04-27 LAB — LIPID PANEL
CHOLESTEROL: 137 mg/dL (ref 0–200)
HDL: 42.4 mg/dL (ref >39.00)
LDL Cholesterol: 80 mg/dL (ref 0–99)
NonHDL: 94.6
Total CHOL/HDL Ratio: 3
Triglycerides: 71 mg/dL (ref 0.0–149.0)
VLDL: 14.2 mg/dL (ref 0.0–40.0)

## 2014-04-27 LAB — TSH: TSH: 1.74 u[IU]/mL (ref 0.35–4.50)

## 2014-04-27 LAB — PSA, MEDICARE: PSA: 0.33 ng/ml (ref 0.10–4.00)

## 2014-04-28 ENCOUNTER — Encounter: Payer: Self-pay | Admitting: Internal Medicine

## 2014-05-01 ENCOUNTER — Encounter: Payer: Self-pay | Admitting: Internal Medicine

## 2014-05-05 ENCOUNTER — Encounter: Payer: Self-pay | Admitting: Internal Medicine

## 2014-05-05 ENCOUNTER — Ambulatory Visit (INDEPENDENT_AMBULATORY_CARE_PROVIDER_SITE_OTHER): Payer: Medicare Other | Admitting: Internal Medicine

## 2014-05-05 VITALS — BP 110/60 | HR 58 | Temp 98.1°F | Ht 77.5 in | Wt 250.0 lb

## 2014-05-05 DIAGNOSIS — C61 Malignant neoplasm of prostate: Secondary | ICD-10-CM

## 2014-05-05 DIAGNOSIS — I10 Essential (primary) hypertension: Secondary | ICD-10-CM

## 2014-05-05 DIAGNOSIS — K219 Gastro-esophageal reflux disease without esophagitis: Secondary | ICD-10-CM

## 2014-05-05 DIAGNOSIS — E7800 Pure hypercholesterolemia, unspecified: Secondary | ICD-10-CM

## 2014-05-05 DIAGNOSIS — Z23 Encounter for immunization: Secondary | ICD-10-CM

## 2014-05-05 NOTE — Progress Notes (Signed)
Subjective:    Patient ID: Edward James, male    DOB: 04-07-1941, 73 y.o.   MRN: [id number]  HPI 73 year old male with past history of prostate cancer s/p radical retropubic prostatectomy, hypercholesterolemia and hypertension who comes in today for a scheduled follow up.   He states he is doing well.  No chest pain or tightness.  No sob.  Taking his meds regularly.  On protonix and align and having no GI issues.  Bowels normal.  No acid reflux.  Overall feels good.  Minimal throat congestion at times.  Nothing significant.  Just had colonoscopy.     Past Medical History  Diagnosis Date  . Prostate cancer     s/p radical retropubic prostatectomy 1999  . Hypertension   . Hypercholesterolemia   . GERD (gastroesophageal reflux disease)   . Diverticulosis     Current Outpatient Prescriptions on File Prior to Visit  Medication Sig Dispense Refill  . albuterol (PROVENTIL HFA;VENTOLIN HFA) 108 (90 BASE) MCG/ACT inhaler Inhale 2 puffs into the lungs every 6 (six) hours as needed for wheezing.  1 Inhaler  0  . aspirin 81 MG tablet Take 81 mg by mouth daily.      . fluticasone (FLONASE) 50 MCG/ACT nasal spray Place 2 sprays into the nose daily.  16 g  6  . lisinopril (PRINIVIL,ZESTRIL) 20 MG tablet TAKE ONE (1) TABLET EACH DAY  90 tablet  1  . Omega-3 Fatty Acids (FISH OIL) 1000 MG CAPS Take 2 capsules by mouth daily.      . pantoprazole (PROTONIX) 40 MG tablet TAKE ONE (1) TABLET EACH DAY  90 tablet  1  . simvastatin (ZOCOR) 10 MG tablet TAKE ONE (1) TABLET EACH DAY  90 tablet  1  . triamterene-hydrochlorothiazide (MAXZIDE-25) 37.5-25 MG per tablet TAKE ONE-HALF TABLET BY MOUTH DAILY  51 tablet  2   No current facility-administered medications on file prior to visit.    Review of Systems Patient denies any headache, lightheadedness or dizziness.  Minimal throat congestion as outlined.  No chest pain, tightness or palpitations.  No increased shortness of breath or significant cough.  No chest  congestion.   No nausea or vomiting.  No acid reflux.  No abdominal pain or cramping.  No bowel change, such as diarrhea, constipation, BRBPR or melana.  No urine change.  Overall feels he is doing well.        Objective:   Physical Exam  Filed Vitals:   05/05/14 0757  BP: 110/60  Pulse: 58  Temp: 98.1 F (36.7 C)   Blood pressure recheck:  65/70  73 year old male in no acute distress.  HEENT:  Nares - clear.  Oropharynx - without lesions. NECK:  Supple.  Nontender.  No audible carotid bruit.  HEART:  Appears to be regular.   LUNGS:  No crackles or wheezing audible.  Respirations even and unlabored.   RADIAL PULSE:  Equal bilaterally.  ABDOMEN:  Soft.  Nontender.  Bowel sounds present and normal.  No audible abdominal bruit.  EXTREMITIES:  No increased edema present.  DP pulses palpable and equal bilaterally.         Assessment & Plan:  CARDIOVASCULAR.  Currently asymptomatic.    GI.  Colonoscopy 02/21/08 revealed diverticulosis.  Recommended follow up in 10 years.  IFOB positive.  F/u colonoscopy 811//15 - diverticulosis and internal hemorrhoids.    HERNIA.  Saw Dr Bernardo Heater.  Decided - observation only.  No pain or problems.  Follow.   HEALTH MAINTENANCE.  Physical 11/11/13.  Last PSA  .33 - 04/27/14.Marland Kitchen   IFOB 03/14/14 positive.  Colonoscopy 8/15 revealed diverticulosis and internal hemorrhoids.

## 2014-05-05 NOTE — Progress Notes (Signed)
Pre visit review using our clinic review tool, if applicable. No additional management support is needed unless otherwise documented below in the visit note. 

## 2014-05-08 ENCOUNTER — Encounter: Payer: Self-pay | Admitting: Internal Medicine

## 2014-05-08 NOTE — Assessment & Plan Note (Signed)
Continue current med regimen.  Low cholesterol diet and exercise.  Follow.

## 2014-05-08 NOTE — Assessment & Plan Note (Signed)
On protonix.  Currently asymptomatic.

## 2014-05-08 NOTE — Assessment & Plan Note (Signed)
Blood pressure doing well.  Same meds.  Follow pressures.  Follow metabolic panel.

## 2014-05-08 NOTE — Assessment & Plan Note (Signed)
S/p retropubic prostatectomy.   PSA 04/27/14 - .33.

## 2014-05-25 ENCOUNTER — Other Ambulatory Visit: Payer: Self-pay | Admitting: Internal Medicine

## 2014-08-09 ENCOUNTER — Other Ambulatory Visit: Payer: Self-pay | Admitting: Internal Medicine

## 2014-08-11 ENCOUNTER — Ambulatory Visit (INDEPENDENT_AMBULATORY_CARE_PROVIDER_SITE_OTHER): Payer: Medicare Other | Admitting: Internal Medicine

## 2014-08-11 ENCOUNTER — Ambulatory Visit (INDEPENDENT_AMBULATORY_CARE_PROVIDER_SITE_OTHER)
Admission: RE | Admit: 2014-08-11 | Discharge: 2014-08-11 | Disposition: A | Discharge status: Home / Self Care | Payer: Medicare Other | Source: Ambulatory Visit | Attending: Internal Medicine | Admitting: Internal Medicine

## 2014-08-11 ENCOUNTER — Encounter: Payer: Self-pay | Admitting: Internal Medicine

## 2014-08-11 VITALS — BP 130/90 | HR 61 | Temp 98.1°F | Ht 77.5 in | Wt 253.5 lb

## 2014-08-11 DIAGNOSIS — M545 Low back pain: Secondary | ICD-10-CM

## 2014-08-11 NOTE — Progress Notes (Signed)
Pre visit review using our clinic review tool, if applicable. No additional management support is needed unless otherwise documented below in the visit note. 

## 2014-08-11 NOTE — Progress Notes (Signed)
  Subjective:    Patient ID: Edward James, male    DOB: May 09, 1941, 73 y.o.   MRN: [id number]  Back Pain  73 year old male with past history of prostate cancer s/p radical retropubic prostatectomy, hypercholesterolemia and hypertension who comes in today as a work in with concerns regarding back pain.  He reports that his back has been bothering him for a while.  Worse over the last two months.  Intermittent flares.  States will extend from just below his belt to his knees.  Will involve both the right and left leg intermittently.  Lifting and standing aggravates.  When active and in the gym - better.  Has taken some advil.  No significant pain today.  No chest pain or tightness.  No sob.  On protonix and align and having no GI issues.  Bowels normal.    Past Medical History  Diagnosis Date  . Prostate cancer     s/p radical retropubic prostatectomy 1999  . Hypertension   . Hypercholesterolemia   . GERD (gastroesophageal reflux disease)   . Diverticulosis     Current Outpatient Prescriptions on File Prior to Visit  Medication Sig Dispense Refill  . aspirin 81 MG tablet Take 81 mg by mouth daily.    Marland Kitchen lisinopril (PRINIVIL,ZESTRIL) 20 MG tablet TAKE ONE (1) TABLET EACH DAY 90 tablet 1  . Omega-3 Fatty Acids (FISH OIL) 1000 MG CAPS Take 2 capsules by mouth daily.    . pantoprazole (PROTONIX) 40 MG tablet TAKE ONE (1) TABLET EACH DAY 90 tablet 1  . simvastatin (ZOCOR) 10 MG tablet TAKE ONE (1) TABLET EACH DAY 90 tablet 1  . triamterene-hydrochlorothiazide (MAXZIDE-25) 37.5-25 MG per tablet TAKE ONE-HALF TABLET BY MOUTH DAILY 51 tablet 2  . albuterol (PROVENTIL HFA;VENTOLIN HFA) 108 (90 BASE) MCG/ACT inhaler Inhale 2 puffs into the lungs every 6 (six) hours as needed for wheezing. (Patient not taking: Reported on 08/11/2014) 1 Inhaler 0  . fluticasone (FLONASE) 50 MCG/ACT nasal spray Place 2 sprays into the nose daily. (Patient not taking: Reported on 08/11/2014) 16 g 6   No current  facility-administered medications on file prior to visit.    Review of Systems  Musculoskeletal: Positive for back pain.  No chest pain, tightness or palpitations.  No increased shortness of breath.  No nausea or vomiting.  No acid reflux.  No abdominal pain or cramping.  No bowel change.  No urine change.  Back pain as outlined.  See above.         Objective:   Physical Exam  Filed Vitals:   08/11/14 1441  BP: 130/90  Pulse: 61  Temp: 98.1 F (35.58 C)   73 year old male in no acute distress. NECK:  Supple.  Nontender.  HEART:  Appears to be regular.   LUNGS:  No crackles or wheezing audible.  Respirations even and unlabored. ABDOMEN:  Soft.  Nontender.  Bowel sounds present and normal.  No audible abdominal bruit. BACK:  Non tender to palpation.  Some increased discomfort in the lower back with straight leg raise.  No motor weakness.    EXTREMITIES:  No increased edema present.  DP pulses palpable and equal bilaterally.         Assessment & Plan:  Bilateral low back pain, with sciatica presence unspecified Persistent intermittent back pain as outlined.   - DG Lumbar Spine Complete; Future Further w/up pending.

## 2014-08-12 ENCOUNTER — Encounter: Payer: Self-pay | Admitting: Internal Medicine

## 2014-08-12 DIAGNOSIS — M549 Dorsalgia, unspecified: Secondary | ICD-10-CM | POA: Insufficient documentation

## 2014-08-16 ENCOUNTER — Encounter: Payer: Self-pay | Admitting: Internal Medicine

## 2014-08-18 ENCOUNTER — Encounter: Payer: Self-pay | Admitting: Internal Medicine

## 2014-08-18 DIAGNOSIS — M545 Low back pain: Secondary | ICD-10-CM

## 2014-08-20 NOTE — Telephone Encounter (Signed)
Order placed for referral to physical therapy.

## 2014-09-20 ENCOUNTER — Other Ambulatory Visit: Payer: Self-pay | Admitting: Internal Medicine

## 2014-11-02 ENCOUNTER — Other Ambulatory Visit (INDEPENDENT_AMBULATORY_CARE_PROVIDER_SITE_OTHER): Payer: Medicare Other

## 2014-11-02 DIAGNOSIS — C61 Malignant neoplasm of prostate: Secondary | ICD-10-CM

## 2014-11-02 DIAGNOSIS — I10 Essential (primary) hypertension: Secondary | ICD-10-CM

## 2014-11-02 DIAGNOSIS — E7800 Pure hypercholesterolemia, unspecified: Secondary | ICD-10-CM

## 2014-11-02 LAB — BASIC METABOLIC PANEL
BUN: 18 mg/dL (ref 6–23)
CHLORIDE: 105 meq/L (ref 96–112)
CO2: 30 meq/L (ref 19–32)
CREATININE: 0.91 mg/dL (ref 0.40–1.50)
Calcium: 9.4 mg/dL (ref 8.4–10.5)
GFR: 87.06 mL/min (ref >60.00)
GLUCOSE: 100 mg/dL — AB (ref 70–99)
Potassium: 4.7 mEq/L (ref 3.5–5.1)
Sodium: 139 mEq/L (ref 135–145)

## 2014-11-02 LAB — HEPATIC FUNCTION PANEL
ALBUMIN: 4.4 g/dL (ref 3.5–5.2)
ALT: 20 U/L (ref 0–53)
AST: 18 U/L (ref 0–37)
Alkaline Phosphatase: 52 U/L (ref 39–117)
Bilirubin, Direct: 0.2 mg/dL (ref 0.0–0.3)
TOTAL PROTEIN: 7 g/dL (ref 6.0–8.3)
Total Bilirubin: 0.8 mg/dL (ref 0.2–1.2)

## 2014-11-02 LAB — LIPID PANEL
CHOL/HDL RATIO: 3
Cholesterol: 142 mg/dL (ref 0–200)
HDL: 47.4 mg/dL (ref >39.00)
LDL Cholesterol: 77 mg/dL (ref 0–99)
NONHDL: 94.6
Triglycerides: 86 mg/dL (ref 0.0–149.0)
VLDL: 17.2 mg/dL (ref 0.0–40.0)

## 2014-11-02 LAB — PSA, MEDICARE: PSA: 0.26 ng/ml (ref 0.10–4.00)

## 2014-11-05 ENCOUNTER — Encounter: Payer: Self-pay | Admitting: Internal Medicine

## 2014-11-06 ENCOUNTER — Ambulatory Visit (INDEPENDENT_AMBULATORY_CARE_PROVIDER_SITE_OTHER): Payer: Medicare Other | Admitting: Internal Medicine

## 2014-11-06 ENCOUNTER — Encounter: Payer: Self-pay | Admitting: Internal Medicine

## 2014-11-06 VITALS — BP 130/70 | HR 59 | Temp 97.8°F | Ht 76.25 in | Wt 254.1 lb

## 2014-11-06 DIAGNOSIS — K219 Gastro-esophageal reflux disease without esophagitis: Secondary | ICD-10-CM

## 2014-11-06 DIAGNOSIS — E7800 Pure hypercholesterolemia, unspecified: Secondary | ICD-10-CM

## 2014-11-06 DIAGNOSIS — Z0000 Encounter for general adult medical examination without abnormal findings: Secondary | ICD-10-CM | POA: Insufficient documentation

## 2014-11-06 DIAGNOSIS — C61 Malignant neoplasm of prostate: Secondary | ICD-10-CM

## 2014-11-06 DIAGNOSIS — L989 Disorder of the skin and subcutaneous tissue, unspecified: Secondary | ICD-10-CM | POA: Insufficient documentation

## 2014-11-06 DIAGNOSIS — M545 Low back pain: Secondary | ICD-10-CM

## 2014-11-06 DIAGNOSIS — I10 Essential (primary) hypertension: Secondary | ICD-10-CM

## 2014-11-06 NOTE — Assessment & Plan Note (Signed)
S/p retropubic prostatectomy.  PSA just checked 11/02/14 - .26.  Follow.

## 2014-11-06 NOTE — Assessment & Plan Note (Signed)
Blood pressure is doing well.  Continue same medication regimen.  Follow pressures.  Follow metabolic panel.

## 2014-11-06 NOTE — Assessment & Plan Note (Signed)
Went to therapy.  Stable.  Does his exercises.  Will notify me if worsens or if desires any further intervention.

## 2014-11-06 NOTE — Assessment & Plan Note (Signed)
Physical today 11/06/14.  PSP .26 11/02/14.  04/11/14 - colonoscopy.

## 2014-11-06 NOTE — Assessment & Plan Note (Signed)
Dark skin lesion.  Refer to dermatology.  Pt also wants skin survey.

## 2014-11-06 NOTE — Progress Notes (Signed)
Pre visit review using our clinic review tool, if applicable. No additional management support is needed unless otherwise documented below in the visit note. 

## 2014-11-06 NOTE — Assessment & Plan Note (Signed)
Symptoms controlled on protonix.   

## 2014-11-06 NOTE — Progress Notes (Signed)
Patient ID: Edward James, male   DOB: [birth year], 74 y.o.   MRN: [id number]   Subjective:    Patient ID: Edward James, male    DOB: 06-Oct-1940, 74 y.o.   MRN: [id number]  HPI  Patient here for his physical exam.  Has a history of prostate cancer s/p radical retropubic prostatectomy, hypercholesterolemia and hypertension.  Is doing relatively well.  Tries to stay active.  Still has back issues.  Low back pain and some stiffness into his legs - at times.  No pain radiating down his legs.  No numbness or tingling.  No weakness.  Does not feel he needs anything more at this time.  No chest pain or tightness.  Breathing stable.     Past Medical History  Diagnosis Date  . Prostate cancer     s/p radical retropubic prostatectomy 1999  . Hypertension   . Hypercholesterolemia   . GERD (gastroesophageal reflux disease)   . Diverticulosis     Current Outpatient Prescriptions on File Prior to Visit  Medication Sig Dispense Refill  . aspirin 81 MG tablet Take 81 mg by mouth daily.    Marland Kitchen lisinopril (PRINIVIL,ZESTRIL) 20 MG tablet TAKE ONE (1) TABLET EACH DAY 90 tablet 1  . Omega-3 Fatty Acids (FISH OIL) 1000 MG CAPS Take 2 capsules by mouth daily.    . pantoprazole (PROTONIX) 40 MG tablet TAKE ONE (1) TABLET EACH DAY 90 tablet 1  . simvastatin (ZOCOR) 10 MG tablet TAKE ONE (1) TABLET EACH DAY 90 tablet 1  . triamterene-hydrochlorothiazide (MAXZIDE-25) 37.5-25 MG per tablet TAKE ONE-HALF TABLET BY MOUTH DAILY 51 tablet 2   No current facility-administered medications on file prior to visit.    Review of Systems  Constitutional: Negative for appetite change and unexpected weight change.  HENT: Negative for congestion and sinus pressure.   Eyes: Negative for pain and visual disturbance.  Respiratory: Negative for cough, chest tightness and shortness of breath.   Cardiovascular: Negative for chest pain, palpitations and leg swelling.  Gastrointestinal: Negative for nausea, vomiting, abdominal  pain, diarrhea and constipation.       No blood in her stool.    Genitourinary: Negative for frequency and difficulty urinating.  Musculoskeletal: Positive for back pain (as outlined.  ). Negative for joint swelling.  Skin: Negative for color change and rash.  Neurological: Negative for dizziness, light-headedness and headaches.  Hematological: Negative for adenopathy. Does not bruise/bleed easily.  Psychiatric/Behavioral: Negative for dysphoric mood and decreased concentration.       Objective:     Blood pressure recheck:  132/72  Physical Exam  Constitutional: He is oriented to person, place, and time. He appears well-developed and well-nourished. No distress.  HENT:  Head: Normocephalic and atraumatic.  Nose: Nose normal.  Mouth/Throat: Oropharynx is clear and moist. No oropharyngeal exudate.  Eyes: Conjunctivae are normal. Right eye exhibits no discharge. Left eye exhibits no discharge.  Neck: Neck supple. No thyromegaly present.  Cardiovascular: Normal rate and regular rhythm.   Pulmonary/Chest: Breath sounds normal. No respiratory distress. He has no wheezes.  Abdominal: Soft. Bowel sounds are normal. There is no tenderness.  Genitourinary:  Left inguinal hernia present.  Non tender to palpation.    Musculoskeletal: He exhibits no edema or tenderness.  Lymphadenopathy:    He has no cervical adenopathy.  Neurological: He is alert and oriented to person, place, and time.  Skin: Skin is warm and dry. No rash noted.  Back lesion - dark.   Psychiatric: He has a  normal mood and affect. His behavior is normal.    BP 130/70 mmHg  Pulse 59  Temp(Src) 97.8 F (36.6 C) (Oral)  Ht 6' 4.25" (1.937 m)  Wt 254 lb 2 oz (115.27 kg)  BMI 30.72 kg/m2  SpO2 96% Wt Readings from Last 3 Encounters:  11/06/14 254 lb 2 oz (115.27 kg)  08/11/14 253 lb 8 oz (114.987 kg)  05/05/14 250 lb (113.399 kg)     Lab Results  Component Value Date   WBC 7.1 11/01/2013   HGB 15.5 11/01/2013    HCT 47.3 11/01/2013   PLT 241.0 11/01/2013   GLUCOSE 100* 11/02/2014   CHOL 142 11/02/2014   TRIG 86.0 11/02/2014   HDL 47.40 11/02/2014   LDLCALC 77 11/02/2014   ALT 20 11/02/2014   AST 18 11/02/2014   NA 139 11/02/2014   K 4.7 11/02/2014   CL 105 11/02/2014   CREATININE 0.91 11/02/2014   BUN 18 11/02/2014   CO2 30 11/02/2014   TSH 1.74 04/27/2014   PSA 0.26 11/02/2014        Assessment & Plan:   Problem List Items Addressed This Visit    Back pain    Went to therapy.  Stable.  Does his exercises.  Will notify me if worsens or if desires any further intervention.        Back skin lesion    Dark skin lesion.  Refer to dermatology.  Pt also wants skin survey.        Relevant Orders   Ambulatory referral to Dermatology   GERD (gastroesophageal reflux disease)    Symptoms controlled on protonix.        Health care maintenance    Physical today 11/06/14.  PSP .26 11/02/14.  04/11/14 - colonoscopy.        Hypercholesterolemia    Low cholesterol diet and exercise.  On simvastatin.  LDL 11/02/14 - 77.  Follow.        Relevant Orders   Lipid panel   Hepatic function panel   Hypertension - Primary    Blood pressure is doing well.  Continue same medication regimen.  Follow pressures.  Follow metabolic panel.        Relevant Orders   Basic metabolic panel   Prostate cancer    S/p retropubic prostatectomy.  PSA just checked 11/02/14 - .26.  Follow.        Relevant Orders   PSA, Medicare     I spent 25 minutes with the patient and more than 50% of the time was spent in consultation regarding the above.     Einar Pheasant, MD

## 2014-11-06 NOTE — Assessment & Plan Note (Signed)
Low cholesterol diet and exercise.  On simvastatin.  LDL 11/02/14 - 77.  Follow.

## 2014-12-04 ENCOUNTER — Other Ambulatory Visit: Payer: Self-pay | Admitting: Internal Medicine

## 2015-02-05 ENCOUNTER — Other Ambulatory Visit: Payer: Self-pay | Admitting: Internal Medicine

## 2015-03-15 ENCOUNTER — Other Ambulatory Visit: Payer: Self-pay | Admitting: Internal Medicine

## 2015-05-04 ENCOUNTER — Other Ambulatory Visit (INDEPENDENT_AMBULATORY_CARE_PROVIDER_SITE_OTHER): Payer: Medicare Other

## 2015-05-04 DIAGNOSIS — E7800 Pure hypercholesterolemia, unspecified: Secondary | ICD-10-CM

## 2015-05-04 DIAGNOSIS — C61 Malignant neoplasm of prostate: Secondary | ICD-10-CM | POA: Diagnosis not present

## 2015-05-04 DIAGNOSIS — I10 Essential (primary) hypertension: Secondary | ICD-10-CM

## 2015-05-04 LAB — BASIC METABOLIC PANEL
BUN: 21 mg/dL (ref 6–23)
CHLORIDE: 107 meq/L (ref 96–112)
CO2: 30 meq/L (ref 19–32)
Calcium: 9.6 mg/dL (ref 8.4–10.5)
Creatinine, Ser: 0.93 mg/dL (ref 0.40–1.50)
GFR: 84.79 mL/min (ref >60.00)
GLUCOSE: 90 mg/dL (ref 70–99)
Potassium: 4.6 mEq/L (ref 3.5–5.1)
Sodium: 143 mEq/L (ref 135–145)

## 2015-05-04 LAB — HEPATIC FUNCTION PANEL
ALK PHOS: 68 U/L (ref 39–117)
ALT: 51 U/L (ref 0–53)
AST: 29 U/L (ref 0–37)
Albumin: 4.3 g/dL (ref 3.5–5.2)
BILIRUBIN DIRECT: 0.2 mg/dL (ref 0.0–0.3)
Total Bilirubin: 1 mg/dL (ref 0.2–1.2)
Total Protein: 6.9 g/dL (ref 6.0–8.3)

## 2015-05-04 LAB — LIPID PANEL
CHOL/HDL RATIO: 3
Cholesterol: 132 mg/dL (ref 0–200)
HDL: 45.4 mg/dL (ref >39.00)
LDL Cholesterol: 73 mg/dL (ref 0–99)
NONHDL: 86.69
Triglycerides: 67 mg/dL (ref 0.0–149.0)
VLDL: 13.4 mg/dL (ref 0.0–40.0)

## 2015-05-04 LAB — PSA, MEDICARE: PSA: 0.27 ng/ml (ref 0.10–4.00)

## 2015-05-05 ENCOUNTER — Encounter: Payer: Self-pay | Admitting: Internal Medicine

## 2015-05-09 ENCOUNTER — Ambulatory Visit (INDEPENDENT_AMBULATORY_CARE_PROVIDER_SITE_OTHER): Payer: Medicare Other | Admitting: Internal Medicine

## 2015-05-09 ENCOUNTER — Encounter: Payer: Self-pay | Admitting: Internal Medicine

## 2015-05-09 VITALS — BP 120/70 | HR 65 | Temp 98.0°F | Ht 76.25 in | Wt 246.1 lb

## 2015-05-09 DIAGNOSIS — R143 Flatulence: Secondary | ICD-10-CM

## 2015-05-09 DIAGNOSIS — I10 Essential (primary) hypertension: Secondary | ICD-10-CM | POA: Diagnosis not present

## 2015-05-09 DIAGNOSIS — M545 Low back pain: Secondary | ICD-10-CM

## 2015-05-09 DIAGNOSIS — K219 Gastro-esophageal reflux disease without esophagitis: Secondary | ICD-10-CM | POA: Diagnosis not present

## 2015-05-09 DIAGNOSIS — Z91048 Other nonmedicinal substance allergy status: Secondary | ICD-10-CM

## 2015-05-09 DIAGNOSIS — Z23 Encounter for immunization: Secondary | ICD-10-CM

## 2015-05-09 DIAGNOSIS — C61 Malignant neoplasm of prostate: Secondary | ICD-10-CM | POA: Diagnosis not present

## 2015-05-09 DIAGNOSIS — Z9109 Other allergy status, other than to drugs and biological substances: Secondary | ICD-10-CM

## 2015-05-09 DIAGNOSIS — E7800 Pure hypercholesterolemia, unspecified: Secondary | ICD-10-CM

## 2015-05-09 DIAGNOSIS — IMO0001 Reserved for inherently not codable concepts without codable children: Secondary | ICD-10-CM

## 2015-05-09 NOTE — Progress Notes (Signed)
Pre-visit discussion using our clinic review tool. No additional management support is needed unless otherwise documented below in the visit note.  

## 2015-05-09 NOTE — Progress Notes (Signed)
Patient ID: Edward James, male   DOB: Dec 02, 1940, 74 y.o.   MRN: [id number]   Subjective:    Patient ID: Edward James, male    DOB: Jul 21, 1941, 74 y.o.   MRN: [id number]  HPI  Patient here for a scheduled follow up.  Reports persistent back pain.  Hurts more when walking or lifting.  No significant pain.  Was questioning if his cholesterol medication could be contributing.  No radiation of pain down legs.  No numbness or tingling.  Does report increased drainage and cough.  No chest congestion.  No sob.  No chest pain or tightness.  No acid reflux.  Medication controlling.  Some increased gas.  Bowels stable.     Past Medical History  Diagnosis Date  . Prostate cancer     s/p radical retropubic prostatectomy 1999  . Hypertension   . Hypercholesterolemia   . GERD (gastroesophageal reflux disease)   . Diverticulosis    Past Surgical History  Procedure Laterality Date  . Retropubic prostatectomy      radical (1999)  . Pilonidal cyst removal  1969  . Inguinal hernia repair  1945  . Tonsillectomy and adenoidectomy  1948   Family History  Problem Relation Age of Onset  . Nephrolithiasis    . Colon cancer Neg Hx    Social History   Social History  . Marital Status: Married    Spouse Name: N/A  . Number of Children: 3  . Years of Education: N/A   Social History Main Topics  . Smoking status: Former Research scientist (life sciences)  . Smokeless tobacco: Never Used  . Alcohol Use: 0.0 oz/week    0 Standard drinks or equivalent per week     Comment: occasional  . Drug Use: No  . Sexual Activity: Not Asked   Other Topics Concern  . None   Social History Narrative    Outpatient Encounter Prescriptions as of 05/09/2015  Medication Sig  . aspirin 81 MG tablet Take 81 mg by mouth daily.  Marland Kitchen lisinopril (PRINIVIL,ZESTRIL) 20 MG tablet TAKE ONE (1) TABLET EACH DAY  . Omega-3 Fatty Acids (FISH OIL) 1000 MG CAPS Take 2 capsules by mouth daily.  . pantoprazole (PROTONIX) 40 MG tablet TAKE ONE (1) TABLET  EACH DAY  . simvastatin (ZOCOR) 10 MG tablet TAKE ONE (1) TABLET EACH DAY  . triamterene-hydrochlorothiazide (MAXZIDE-25) 37.5-25 MG per tablet TAKE ONE-HALF TABLET BY MOUTH DAILY   No facility-administered encounter medications on file as of 05/09/2015.    Review of Systems  Constitutional: Negative for appetite change and unexpected weight change.  HENT: Positive for postnasal drip. Negative for sinus pressure.        Minimal nasal congestion.   Eyes: Negative for pain and visual disturbance.  Respiratory: Positive for cough. Negative for chest tightness and shortness of breath.   Cardiovascular: Negative for chest pain, palpitations and leg swelling.  Gastrointestinal: Negative for nausea, vomiting, abdominal pain and diarrhea.  Genitourinary: Negative for dysuria and difficulty urinating.  Musculoskeletal: Positive for back pain.  Skin: Negative for color change and rash.  Neurological: Negative for dizziness, light-headedness and headaches.  Psychiatric/Behavioral: Negative for dysphoric mood and agitation.       Objective:    Physical Exam  Constitutional: He appears well-developed and well-nourished. No distress.  HENT:  Nose: Nose normal.  Mouth/Throat: Oropharynx is clear and moist.  Eyes: Right eye exhibits no discharge. Left eye exhibits no discharge.  Neck: Neck supple. No thyromegaly present.  Cardiovascular: Normal rate and regular  rhythm.   Pulmonary/Chest: Effort normal and breath sounds normal. No respiratory distress.  Abdominal: Soft. Bowel sounds are normal. There is no tenderness.  Musculoskeletal: He exhibits no edema or tenderness.  Lymphadenopathy:    He has no cervical adenopathy.  Skin: Skin is warm and dry. No erythema.  Psychiatric: He has a normal mood and affect. His behavior is normal.    BP 120/70 mmHg  Pulse 65  Temp(Src) 98 F (36.7 C) (Oral)  Ht 6' 4.25" (1.937 m)  Wt 246 lb 2 oz (111.642 kg)  BMI 29.76 kg/m2  SpO2 97% Wt Readings  from Last 3 Encounters:  05/09/15 246 lb 2 oz (111.642 kg)  11/06/14 254 lb 2 oz (115.27 kg)  08/11/14 253 lb 8 oz (114.987 kg)     Lab Results  Component Value Date   WBC 7.1 11/01/2013   HGB 15.5 11/01/2013   HCT 47.3 11/01/2013   PLT 241.0 11/01/2013   GLUCOSE 90 05/04/2015   CHOL 132 05/04/2015   TRIG 67.0 05/04/2015   HDL 45.40 05/04/2015   LDLCALC 73 05/04/2015   ALT 51 05/04/2015   AST 29 05/04/2015   NA 143 05/04/2015   K 4.6 05/04/2015   CL 107 05/04/2015   CREATININE 0.93 05/04/2015   BUN 21 05/04/2015   CO2 30 05/04/2015   TSH 1.74 04/27/2014   PSA 0.27 05/04/2015    Dg Lumbar Spine Complete  08/11/2014   CLINICAL DATA:  Persistent back pain for 3 months  EXAM: LUMBAR SPINE - COMPLETE 4+ VIEW  COMPARISON:  None.  FINDINGS: Five lumbar type vertebral bodies are well visualized. No pars defects are noted. Osteophytic changes are noted at all levels. Facet hypertrophic changes are seen. Very minimal anterolisthesis of L3 on L4 is noted of a degenerative nature. Mild disc space narrowing is noted at L4-5. Postsurgical changes are noted in the pelvis.  IMPRESSION: Multilevel degenerative change without acute abnormality.   Electronically Signed   By: Inez Catalina M.D.   On: 08/11/2014 21:29       Assessment & Plan:   Problem List Items Addressed This Visit    Back pain    Went to therapy.  Going to gym.  Exercise.  Will notify me if worsens or desires any further intervention.  Stop simvastatin.  Call with update in a few weeks.  Follow.         Environmental allergies    Nasal congestion and drainage as outlined.  No need for abx.  nasacort and saline nasal spray as directed.  Follow.  Notify me if symptoms worsen or do not resolve.        Advertising account executive as directed.  Follow.        GERD (gastroesophageal reflux disease)    Symptoms controlled on protonix.  Follow.       Hypercholesterolemia    On simvastatin.  Low cholesterol diet and exercise.  Follow  lipid panel.        Hypertension - Primary    Blood pressure under good control.  Continue same medication regimen.  Follow pressures.  Follow metabolic panel.        Prostate cancer    S/p retropubic prostatectomy.  PSA 05/04/15 - .27.  Colonoscopy 04/11/14.           Einar Pheasant, MD

## 2015-05-09 NOTE — Patient Instructions (Signed)
Stop simvastatin.   nasacort (or flonase) nasal spray - two sprays each nostril one time per day.  Do this in the evening.   Saline nasal spray - flush nose at least 2-3x/day.    Align - one per day

## 2015-05-13 ENCOUNTER — Encounter: Payer: Self-pay | Admitting: Internal Medicine

## 2015-05-13 DIAGNOSIS — Z9109 Other allergy status, other than to drugs and biological substances: Secondary | ICD-10-CM | POA: Insufficient documentation

## 2015-05-13 DIAGNOSIS — IMO0001 Reserved for inherently not codable concepts without codable children: Secondary | ICD-10-CM | POA: Insufficient documentation

## 2015-05-13 DIAGNOSIS — R140 Abdominal distension (gaseous): Secondary | ICD-10-CM | POA: Insufficient documentation

## 2015-05-13 NOTE — Assessment & Plan Note (Addendum)
Went to therapy.  Going to gym.  Exercise.  Will notify me if worsens or desires any further intervention.  Stop simvastatin.  Call with update in a few weeks.  Follow.

## 2015-05-13 NOTE — Assessment & Plan Note (Signed)
Nasal congestion and drainage as outlined.  No need for abx.  nasacort and saline nasal spray as directed.  Follow.  Notify me if symptoms worsen or do not resolve.

## 2015-05-13 NOTE — Assessment & Plan Note (Signed)
S/p retropubic prostatectomy.  PSA 05/04/15 - .27.  Colonoscopy 04/11/14.

## 2015-05-13 NOTE — Assessment & Plan Note (Signed)
Blood pressure under good control.  Continue same medication regimen.  Follow pressures.  Follow metabolic panel.   

## 2015-05-13 NOTE — Assessment & Plan Note (Signed)
On simvastatin.  Low cholesterol diet and exercise.  Follow lipid panel.  

## 2015-05-13 NOTE — Assessment & Plan Note (Signed)
Align as directed.  Follow.

## 2015-05-13 NOTE — Assessment & Plan Note (Signed)
Symptoms controlled on protonix.  Follow.   

## 2015-05-30 ENCOUNTER — Other Ambulatory Visit: Payer: Self-pay | Admitting: Internal Medicine

## 2015-06-04 ENCOUNTER — Other Ambulatory Visit: Payer: Self-pay | Admitting: Internal Medicine

## 2015-07-11 ENCOUNTER — Encounter: Payer: Self-pay | Admitting: Internal Medicine

## 2015-07-11 ENCOUNTER — Ambulatory Visit (INDEPENDENT_AMBULATORY_CARE_PROVIDER_SITE_OTHER): Payer: Medicare Other | Admitting: Internal Medicine

## 2015-07-11 VITALS — BP 110/70 | HR 64 | Temp 97.8°F | Resp 18 | Ht 76.25 in | Wt 242.0 lb

## 2015-07-11 DIAGNOSIS — IMO0001 Reserved for inherently not codable concepts without codable children: Secondary | ICD-10-CM

## 2015-07-11 DIAGNOSIS — I10 Essential (primary) hypertension: Secondary | ICD-10-CM | POA: Diagnosis not present

## 2015-07-11 DIAGNOSIS — C61 Malignant neoplasm of prostate: Secondary | ICD-10-CM

## 2015-07-11 DIAGNOSIS — K219 Gastro-esophageal reflux disease without esophagitis: Secondary | ICD-10-CM

## 2015-07-11 DIAGNOSIS — R0602 Shortness of breath: Secondary | ICD-10-CM | POA: Diagnosis not present

## 2015-07-11 DIAGNOSIS — E7800 Pure hypercholesterolemia, unspecified: Secondary | ICD-10-CM

## 2015-07-11 DIAGNOSIS — M545 Low back pain: Secondary | ICD-10-CM

## 2015-07-11 DIAGNOSIS — R143 Flatulence: Secondary | ICD-10-CM

## 2015-07-11 NOTE — Progress Notes (Signed)
Pre-visit discussion using our clinic review tool. No additional management support is needed unless otherwise documented below in the visit note.  

## 2015-07-11 NOTE — Progress Notes (Signed)
Patient ID: Ace Bergfeld, male   DOB: [birth year], 74 y.o.   MRN: [id number]   Subjective:    Patient ID: Tristram Milian, male    DOB: [birth year], 74 y.o.   MRN: [id number]  HPI  Patient with past history of hypercholesterolemia, prostate cancer, hypertension and GERD.  He comes in today to follow up on these issues.  He reports his back is no better.  Physical therapy did not help.  With the persistent pain, discussed MRI.  He would like to proceed with further testing.  Notices more when he stands a lot.  Sometimes will notice a different sensation in his feet.  He has adjusted his diet.  Decreased portions.  Has lost weight.  Took align.  The queezy feeling resolved.  Does report some full feeling at times. Bowels moving.  Some sob with exertion.   Some fatigue.  No chest pain.     Past Medical History  Diagnosis Date  . Prostate cancer [facility])     s/p radical retropubic prostatectomy 1999  . Hypertension   . Hypercholesterolemia   . GERD (gastroesophageal reflux disease)   . Diverticulosis    Past Surgical History  Procedure Laterality Date  . Retropubic prostatectomy      radical (1999)  . Pilonidal cyst removal  1969  . Inguinal hernia repair  1945  . Tonsillectomy and adenoidectomy  1948   Family History  Problem Relation Age of Onset  . Nephrolithiasis    . Colon cancer Neg Hx    Social History   Social History  . Marital Status: Married    Spouse Name: N/A  . Number of Children: 3  . Years of Education: N/A   Social History Main Topics  . Smoking status: Former Research scientist (life sciences)  . Smokeless tobacco: Never Used  . Alcohol Use: 0.0 oz/week    0 Standard drinks or equivalent per week     Comment: occasional  . Drug Use: No  . Sexual Activity: Not Asked   Other Topics Concern  . None   Social History Narrative    Outpatient Encounter Prescriptions as of 07/11/2015  Medication Sig  . aspirin 81 MG tablet Take 81 mg by mouth daily.  . fluticasone (FLONASE) 50 MCG/ACT  nasal spray Place into both nostrils daily.  Marland Kitchen lisinopril (PRINIVIL,ZESTRIL) 20 MG tablet TAKE ONE (1) TABLET EACH DAY  . Omega-3 Fatty Acids (FISH OIL) 1000 MG CAPS Take 2 capsules by mouth daily.  . pantoprazole (PROTONIX) 40 MG tablet TAKE ONE (1) TABLET EACH DAY  . triamterene-hydrochlorothiazide (MAXZIDE-25) 37.5-25 MG tablet TAKE ONE-HALF TABLET BY MOUTH DAILY  . [DISCONTINUED] simvastatin (ZOCOR) 10 MG tablet TAKE ONE (1) TABLET EACH DAY   No facility-administered encounter medications on file as of 07/11/2015.    Review of Systems  Constitutional:       Has adjusted diet.  Lost weight.    HENT: Negative for congestion and sinus pressure.   Respiratory: Positive for shortness of breath (sob with exertion. ). Negative for cough.   Cardiovascular: Negative for chest pain, palpitations and leg swelling.  Gastrointestinal: Negative for vomiting, abdominal pain and diarrhea.       Nausea resolved.   Genitourinary: Negative for dysuria and difficulty urinating.  Musculoskeletal: Positive for back pain (persistent). Negative for joint swelling.  Skin: Negative for color change and rash.  Neurological: Negative for dizziness, light-headedness and headaches.  Psychiatric/Behavioral: Negative for dysphoric mood and agitation.       Objective:  Physical Exam  Constitutional: He appears well-developed and well-nourished. No distress.  HENT:  Nose: Nose normal.  Mouth/Throat: Oropharynx is clear and moist.  Eyes: Conjunctivae are normal. Right eye exhibits no discharge. Left eye exhibits no discharge.  Neck: Neck supple.  Cardiovascular: Normal rate and regular rhythm.   Pulmonary/Chest: Effort normal and breath sounds normal. No respiratory distress.  Abdominal: Soft. Bowel sounds are normal. There is no tenderness.  Musculoskeletal: He exhibits no edema or tenderness.  Lymphadenopathy:    He has no cervical adenopathy.  Skin: No rash noted. No erythema.  Psychiatric: He has a  normal mood and affect. His behavior is normal.    BP 110/70 mmHg  Pulse 64  Temp(Src) 97.8 F (36.6 C) (Oral)  Resp 18  Ht 6' 4.25" (1.937 m)  Wt 242 lb (109.77 kg)  BMI 29.26 kg/m2  SpO2 98% Wt Readings from Last 3 Encounters:  07/11/15 242 lb (109.77 kg)  05/09/15 246 lb 2 oz (111.642 kg)  11/06/14 254 lb 2 oz (115.27 kg)     Lab Results  Component Value Date   WBC 7.1 11/01/2013   HGB 15.5 11/01/2013   HCT 47.3 11/01/2013   PLT 241.0 11/01/2013   GLUCOSE 90 05/04/2015   CHOL 132 05/04/2015   TRIG 67.0 05/04/2015   HDL 45.40 05/04/2015   LDLCALC 73 05/04/2015   ALT 51 05/04/2015   AST 29 05/04/2015   NA 143 05/04/2015   K 4.6 05/04/2015   CL 107 05/04/2015   CREATININE 0.93 05/04/2015   BUN 21 05/04/2015   CO2 30 05/04/2015   TSH 1.74 04/27/2014   PSA 0.27 05/04/2015    Dg Lumbar Spine Complete  08/11/2014  CLINICAL DATA:  Persistent back pain for 3 months EXAM: LUMBAR SPINE - COMPLETE 4+ VIEW COMPARISON:  None. FINDINGS: Five lumbar type vertebral bodies are well visualized. No pars defects are noted. Osteophytic changes are noted at all levels. Facet hypertrophic changes are seen. Very minimal anterolisthesis of L3 on L4 is noted of a degenerative nature. Mild disc space narrowing is noted at L4-5. Postsurgical changes are noted in the pelvis. IMPRESSION: Multilevel degenerative change without acute abnormality. Electronically Signed   By: Inez Catalina M.D.   On: 08/11/2014 21:29       Assessment & Plan:   Problem List Items Addressed This Visit    Back pain    Persistent back pain.  Did not respond to physical therapy.  Schedule MRI.       Relevant Orders   MR Lumbar Spine Wo Contrast   Gas    Gas resolved on align.  No nausea.  Some full feeling at times.  Bowels stable.  Adjusting diet.  Feels better.  Follow.       GERD (gastroesophageal reflux disease)    Symptoms controlled on protonix.       Hypercholesterolemia    Low cholesterol diet and  exercise.  Follow lipid panel.        Hypertension    Blood pressure under good control.  Continue same medication regimen.  Follow pressures.  Follow metabolic panel.        Prostate cancer [facility])    S/p retropubic prostatectomy.  PSA 05/04/15 - .27.  Colonoscopy 8/11/5.        SOB (shortness of breath) on exertion    SOB with exertion.  Fatigue.  EKG obtained and revealed SR with no acute ischemic changes.  Given symptoms and risk factors, refer to cardiology  for further evaluation and question of need for stress testing.         Other Visit Diagnoses    SOB (shortness of breath)    -  Primary    Relevant Orders    EKG 12-Lead (Completed)    Ambulatory referral to Cardiology        Einar Pheasant, MD

## 2015-07-15 ENCOUNTER — Encounter: Payer: Self-pay | Admitting: Internal Medicine

## 2015-07-15 DIAGNOSIS — R0602 Shortness of breath: Secondary | ICD-10-CM | POA: Insufficient documentation

## 2015-07-15 NOTE — Assessment & Plan Note (Signed)
SOB with exertion.  Fatigue.  EKG obtained and revealed SR with no acute ischemic changes.  Given symptoms and risk factors, refer to cardiology for further evaluation and question of need for stress testing.

## 2015-07-15 NOTE — Assessment & Plan Note (Signed)
Persistent back pain.  Did not respond to physical therapy.  Schedule MRI.

## 2015-07-15 NOTE — Assessment & Plan Note (Signed)
S/p retropubic prostatectomy.  PSA 05/04/15 - .27.  Colonoscopy 8/11/5.

## 2015-07-15 NOTE — Assessment & Plan Note (Signed)
Gas resolved on align.  No nausea.  Some full feeling at times.  Bowels stable.  Adjusting diet.  Feels better.  Follow.

## 2015-07-15 NOTE — Assessment & Plan Note (Signed)
Low cholesterol diet and exercise.  Follow lipid panel.   

## 2015-07-15 NOTE — Assessment & Plan Note (Signed)
Blood pressure under good control.  Continue same medication regimen.  Follow pressures.  Follow metabolic panel.   

## 2015-07-15 NOTE — Assessment & Plan Note (Signed)
Symptoms controlled on protonix.   

## 2015-07-16 ENCOUNTER — Encounter: Payer: Self-pay | Admitting: Cardiovascular Disease

## 2015-07-16 ENCOUNTER — Ambulatory Visit (INDEPENDENT_AMBULATORY_CARE_PROVIDER_SITE_OTHER): Payer: Medicare Other | Admitting: Cardiovascular Disease

## 2015-07-16 VITALS — BP 116/70 | HR 64 | Ht 76.0 in | Wt 245.8 lb

## 2015-07-16 DIAGNOSIS — I10 Essential (primary) hypertension: Secondary | ICD-10-CM

## 2015-07-16 DIAGNOSIS — R0602 Shortness of breath: Secondary | ICD-10-CM | POA: Diagnosis not present

## 2015-07-16 DIAGNOSIS — R5382 Chronic fatigue, unspecified: Secondary | ICD-10-CM | POA: Diagnosis not present

## 2015-07-16 DIAGNOSIS — E7800 Pure hypercholesterolemia, unspecified: Secondary | ICD-10-CM

## 2015-07-16 NOTE — Assessment & Plan Note (Signed)
Blood pressure is well controlled on current medications. 

## 2015-07-16 NOTE — Assessment & Plan Note (Signed)
This could be due to aging and physical deconditioning. Nonetheless, he has multiple risk factors for coronary artery disease I think we have to exclude angina equivalent.  Thus,I requested a treadmill nuclear stress test. The patient might not be able to exercise on a treadmill due to his chronic back pain. However, he prefers to give it a try.

## 2015-07-16 NOTE — Patient Instructions (Addendum)
Medication Instructions:  Your physician recommends that you continue on your current medications as directed. Please refer to the Current Medication list given to you today.   Labwork: None   Testing/Procedures: Your physician has requested that you have a lexiscan myoview. For further information please visit [email or web address]. Please follow instruction sheet, as given.  [place]  Your caregiver has ordered a Stress Test with nuclear imaging. The purpose of this test is to evaluate the blood supply to your heart muscle. This procedure is referred to as a "Non-Invasive Stress Test." This is because other than having an IV started in your vein, nothing is inserted or "invades" your body. Cardiac stress tests are done to find areas of poor blood flow to the heart by determining the extent of coronary artery disease (CAD). Some patients exercise on a treadmill, which naturally increases the blood flow to your heart, while others who are  unable to walk on a treadmill due to physical limitations have a pharmacologic/chemical stress agent called Lexiscan . This medicine will mimic walking on a treadmill by temporarily increasing your coronary blood flow.   Please note: these test may take anywhere between 2-4 hours to complete  PLEASE REPORT TO [place] TO GO  Date of Procedure: Thursday, November 17, 8:00am Arrival Time for Procedure: 7:45am  Instructions regarding medication: You may take your morning medications with a sip of water    PLEASE NOTIFY THE OFFICE AT LEAST 24 HOURS IN ADVANCE IF YOU ARE UNABLE TO [place].  [phone] AND  PLEASE NOTIFY NUCLEAR MEDICINE AT [facility] AT LEAST 24 HOURS IN ADVANCE IF YOU ARE UNABLE TO KEEP YOUR APPOINTMENT. [phone]  How to prepare for your Myoview test:   Do not eat or drink after midnight  No caffeine for 24 hours prior to test  No smoking 24  hours prior to test.  Your medication may be taken with water.  If your doctor stopped a medication because of this test, do not take that medication.  Ladies, please do not wear dresses.  Skirts or pants are appropriate. Please wear a short sleeve shirt.  No perfume, cologne or lotion.  Wear comfortable walking shoes. No heels!            Follow-Up: Your physician recommends that you schedule a follow-up appointment as needed with Dr. Fletcher Anon.    Any Other Special Instructions Will Be Listed Below (If Applicable).     If you need a refill on your cardiac medications before your next appointment, please call your pharmacy.  Cardiac Nuclear Scanning A cardiac nuclear scan is used to check your heart for problems, such as the following:  A portion of the heart is not getting enough blood.  Part of the heart muscle has died, which happens with a heart attack.  The heart wall is not working normally.  In this test, a radioactive dye (tracer) is injected into your bloodstream. After the tracer has traveled to your heart, a scanning device is used to measure how much of the tracer is absorbed by or distributed to various areas of your heart. LET [facility] CARE PROVIDER KNOW ABOUT:  Any allergies you have.  All medicines you are taking, including vitamins, herbs, eye drops, creams, and over-the-counter medicines.  Previous problems you or members of your family have had with the use of anesthetics.  Any blood disorders you have.  Previous surgeries you  have had.  Medical conditions you have.  RISKS AND COMPLICATIONS Generally, this is a safe procedure. However, as with any procedure, problems can occur. Possible problems include:   Serious chest pain.  Rapid heartbeat.  Sensation of warmth in your chest. This usually passes quickly. BEFORE THE PROCEDURE Ask your health care provider about changing or stopping your regular medicines. PROCEDURE This procedure is  usually done at a hospital and takes 2-4 hours.  An IV tube is inserted into one of your veins.  Your health care provider will inject a small amount of radioactive tracer through the tube.  You will then wait for 20-40 minutes while the tracer travels through your bloodstream.  You will lie down on an exam table so images of your heart can be taken. Images will be taken for about 15-20 minutes.  You will exercise on a treadmill or stationary bike. While you exercise, your heart activity will be monitored with an electrocardiogram (ECG), and your blood pressure will be checked.  If you are unable to exercise, you may be given a medicine to make your heart beat faster.  When blood flow to your heart has peaked, tracer will again be injected through the IV tube.  After 20-40 minutes, you will get back on the exam table and have more images taken of your heart.  When the procedure is over, your IV tube will be removed. AFTER THE PROCEDURE  You will likely be able to leave shortly after the test. Unless your health care provider tells you otherwise, you may return to your normal schedule, including diet, activities, and medicines.  Make sure you find out how and when you will get your test results.   This information is not intended to replace advice given to you by your health care provider. Make sure you discuss any questions you have with your health care provider.   Document Released: 09/12/2004 Document Revised: 08/23/2013 Document Reviewed: 07/27/2013 Elsevier Interactive Patient Education [employer].

## 2015-07-16 NOTE — Assessment & Plan Note (Signed)
Lab Results  Component Value Date   CHOL 132 05/04/2015   HDL 45.40 05/04/2015   LDLCALC 73 05/04/2015   TRIG 67.0 05/04/2015   CHOLHDL 3 05/04/2015   Well-controlled on simvastatin.

## 2015-07-16 NOTE — Progress Notes (Signed)
Primary care physician: Dr. Einar Pheasant.  HPI  This is a pleasant 74 year old man who was referred for evaluation of exertional dyspnea. He has no previous cardiac history and reports having cardiac workup done years ago which was unremarkable. He has known history of hypertension, hyperlipidemia, chronic back pain and GERD. He is not a diabetic and does not smoke.He has no family history of coronary artery disease. He reports gradual exertional dyspnea with no chest pain. The dyspnea happens with moderate activities. No orthopnea, PND or lower extremity edema. No palpitations or syncope.   No Known Allergies   Current Outpatient Prescriptions on File Prior to Visit  Medication Sig Dispense Refill  . aspirin 81 MG tablet Take 81 mg by mouth daily.    . fluticasone (FLONASE) 50 MCG/ACT nasal spray Place into both nostrils as needed.     Marland Kitchen lisinopril (PRINIVIL,ZESTRIL) 20 MG tablet TAKE ONE (1) TABLET EACH DAY 90 tablet 1  . Omega-3 Fatty Acids (FISH OIL) 1000 MG CAPS Take 2 capsules by mouth daily.    . pantoprazole (PROTONIX) 40 MG tablet TAKE ONE (1) TABLET EACH DAY 90 tablet 3  . triamterene-hydrochlorothiazide (MAXZIDE-25) 37.5-25 MG tablet TAKE ONE-HALF TABLET BY MOUTH DAILY 45 tablet 3   No current facility-administered medications on file prior to visit.     Past Medical History  Diagnosis Date  . Prostate cancer [facility])     s/p radical retropubic prostatectomy 1999  . Hypertension   . Hypercholesterolemia   . GERD (gastroesophageal reflux disease)   . Diverticulosis      Past Surgical History  Procedure Laterality Date  . Retropubic prostatectomy      radical (1999)  . Pilonidal cyst removal  1969  . Inguinal hernia repair  1945  . Tonsillectomy and adenoidectomy  1948     Family History  Problem Relation Age of Onset  . Nephrolithiasis    . Colon cancer Neg Hx   . AAA (abdominal aortic aneurysm) Father      Social History   Social History  . Marital  Status: Married    Spouse Name: N/A  . Number of Children: 3  . Years of Education: N/A   Occupational History  . Not on file.   Social History Main Topics  . Smoking status: Former Research scientist (life sciences)  . Smokeless tobacco: Never Used  . Alcohol Use: No     Comment: occasional  . Drug Use: No  . Sexual Activity: Not on file   Other Topics Concern  . Not on file   Social History Narrative     ROS A 10 point review of system was performed. It is negative other than that mentioned in the history of present illness.   PHYSICAL EXAM   BP 116/70 mmHg  Pulse 64  Ht 6\' 4"  (1.93 m)  Wt 245 lb 12 oz (111.471 kg)  BMI 29.93 kg/m2 Constitutional: He is oriented to person, place, and time. He appears well-developed and well-nourished. No distress.  HENT: No nasal discharge.  Head: Normocephalic and atraumatic.  Eyes: Pupils are equal and round.  No discharge. Neck: Normal range of motion. Neck supple. No JVD present. No thyromegaly present.  Cardiovascular: Normal rate, regular rhythm, normal heart sounds. Exam reveals no gallop and no friction rub. No murmur heard.  Pulmonary/Chest: Effort normal and breath sounds normal. No stridor. No respiratory distress. He has no wheezes. He has no rales. He exhibits no tenderness.  Abdominal: Soft. Bowel sounds are normal. He exhibits no  distension. There is no tenderness. There is no rebound and no guarding.  Musculoskeletal: Normal range of motion. He exhibits no edema and no tenderness.  Neurological: He is alert and oriented to person, place, and time. Coordination normal.  Skin: Skin is warm and dry. No rash noted. He is not diaphoretic. No erythema. No pallor.  Psychiatric: He has a normal mood and affect. His behavior is normal. Judgment and thought content normal.       [id number] EKG showed sinus bradycardia with nonspecific ST changes.   ASSESSMENT AND PLAN

## 2015-07-19 ENCOUNTER — Encounter
Admission: RE | Admit: 2015-07-19 | Discharge: 2015-07-19 | Disposition: A | Discharge status: Home / Self Care | Payer: Medicare Other | Source: Ambulatory Visit | Attending: Cardiovascular Disease | Admitting: Cardiovascular Disease

## 2015-07-19 DIAGNOSIS — R0602 Shortness of breath: Secondary | ICD-10-CM | POA: Diagnosis not present

## 2015-07-19 LAB — NM MYOCAR MULTI W/SPECT W/WALL MOTION / EF
CHL CUP RESTING HR STRESS: 58 {beats}/min
CSEPHR: 91 %
CSEPPHR: 136 {beats}/min
Estimated workload: 10.1 METS
Exercise duration (min): 8 min
LV dias vol: 110 mL
LVSYSVOL: 56 mL
NUC STRESS TID: 0.71
SDS: 6
SRS: 7
SSS: 13

## 2015-07-19 MED ORDER — TECHNETIUM TC 99M SESTAMIBI - CARDIOLITE
12.9500 | Freq: Once | INTRAVENOUS | Status: AC | PRN
  Administered 2015-07-19: 08:00:00 12.95 via INTRAVENOUS

## 2015-07-19 MED ORDER — TECHNETIUM TC 99M SESTAMIBI - CARDIOLITE
30.0000 | Freq: Once | INTRAVENOUS | Status: AC | PRN
  Administered 2015-07-19: 32.53 via INTRAVENOUS

## 2015-07-25 ENCOUNTER — Ambulatory Visit
Admission: RE | Admit: 2015-07-25 | Discharge: 2015-07-25 | Disposition: A | Discharge status: Home / Self Care | Payer: Medicare Other | Source: Ambulatory Visit | Attending: Internal Medicine | Admitting: Internal Medicine

## 2015-07-25 DIAGNOSIS — M4806 Spinal stenosis, lumbar region: Secondary | ICD-10-CM | POA: Diagnosis not present

## 2015-07-25 DIAGNOSIS — M5127 Other intervertebral disc displacement, lumbosacral region: Secondary | ICD-10-CM | POA: Diagnosis not present

## 2015-07-25 DIAGNOSIS — M545 Low back pain: Secondary | ICD-10-CM

## 2015-07-28 ENCOUNTER — Encounter: Payer: Self-pay | Admitting: Internal Medicine

## 2015-07-31 ENCOUNTER — Encounter: Payer: Self-pay | Admitting: Internal Medicine

## 2015-07-31 DIAGNOSIS — M5442 Lumbago with sciatica, left side: Principal | ICD-10-CM

## 2015-07-31 DIAGNOSIS — M5441 Lumbago with sciatica, right side: Secondary | ICD-10-CM

## 2015-08-02 NOTE — Telephone Encounter (Signed)
Order placed for ortho referral Edward James.  See my chart message.

## 2015-08-24 ENCOUNTER — Encounter: Payer: Self-pay | Admitting: *Deleted

## 2015-08-24 ENCOUNTER — Encounter: Payer: Self-pay | Admitting: Internal Medicine

## 2015-08-28 NOTE — Telephone Encounter (Signed)
FYI

## 2015-09-04 ENCOUNTER — Encounter: Payer: Self-pay | Admitting: Internal Medicine

## 2015-09-04 ENCOUNTER — Ambulatory Visit (INDEPENDENT_AMBULATORY_CARE_PROVIDER_SITE_OTHER): Payer: Medicare Other | Admitting: Internal Medicine

## 2015-09-04 VITALS — BP 122/78 | HR 56 | Temp 98.4°F | Resp 18 | Ht 76.0 in | Wt 256.4 lb

## 2015-09-04 DIAGNOSIS — E7800 Pure hypercholesterolemia, unspecified: Secondary | ICD-10-CM

## 2015-09-04 DIAGNOSIS — R0602 Shortness of breath: Secondary | ICD-10-CM

## 2015-09-04 DIAGNOSIS — K219 Gastro-esophageal reflux disease without esophagitis: Secondary | ICD-10-CM

## 2015-09-04 DIAGNOSIS — C61 Malignant neoplasm of prostate: Secondary | ICD-10-CM

## 2015-09-04 DIAGNOSIS — I10 Essential (primary) hypertension: Secondary | ICD-10-CM | POA: Diagnosis not present

## 2015-09-04 DIAGNOSIS — M545 Low back pain: Secondary | ICD-10-CM

## 2015-09-04 NOTE — Progress Notes (Signed)
Pre-visit discussion using our clinic review tool. No additional management support is needed unless otherwise documented below in the visit note.  

## 2015-09-05 ENCOUNTER — Encounter: Payer: Self-pay | Admitting: Internal Medicine

## 2015-09-06 ENCOUNTER — Telehealth: Payer: Self-pay | Admitting: Internal Medicine

## 2015-09-06 NOTE — Assessment & Plan Note (Signed)
On simvastatin.  Low cholesterol diet and exercise.  Follow lipid panel and liver function tests.   

## 2015-09-06 NOTE — Progress Notes (Signed)
Patient ID: Edward James, male   DOB: [birth year], 75 y.o.   MRN: [id number]   Subjective:    Patient ID: Edward James, male    DOB: 03/02/1941, 75 y.o.   MRN: [id number]  HPI  Patient with past history of prostate cancer, hypertension, GERD and hypercholesterolemia.  He comes in today for a pre op evaluation.  Is planning to have back surgery 09/28/15. He reports his back is limiting his activity and he is ready to have something done.  Reports no chest pain or tightness.  No sob.  No acid reflux.  Takes protonix.  No abdominal pain or cramping.  Bowels stable.  He just recently saw Dr Fletcher Anon. Had cardiac stress test.  Do not have results.  States ok.     Past Medical History  Diagnosis Date  . Prostate cancer [facility])     s/p radical retropubic prostatectomy 1999  . Hypertension   . Hypercholesterolemia   . GERD (gastroesophageal reflux disease)   . Diverticulosis    Past Surgical History  Procedure Laterality Date  . Retropubic prostatectomy      radical (1999)  . Pilonidal cyst removal  1969  . Inguinal hernia repair  1945  . Tonsillectomy and adenoidectomy  1948   Family History  Problem Relation Age of Onset  . Nephrolithiasis    . Colon cancer Neg Hx   . AAA (abdominal aortic aneurysm) Father    Social History   Social History  . Marital Status: Married    Spouse Name: N/A  . Number of Children: 3  . Years of Education: N/A   Social History Main Topics  . Smoking status: Former Research scientist (life sciences)  . Smokeless tobacco: Never Used  . Alcohol Use: No     Comment: occasional  . Drug Use: No  . Sexual Activity: Not Asked   Other Topics Concern  . None   Social History Narrative    Outpatient Encounter Prescriptions as of 09/04/2015  Medication Sig  . aspirin 81 MG tablet Take 81 mg by mouth daily.  . fluticasone (FLONASE) 50 MCG/ACT nasal spray Place into both nostrils as needed.   Marland Kitchen lisinopril (PRINIVIL,ZESTRIL) 20 MG tablet TAKE ONE (1) TABLET EACH DAY  . Omega-3 Fatty  Acids (FISH OIL) 1000 MG CAPS Take 2 capsules by mouth daily.  . pantoprazole (PROTONIX) 40 MG tablet TAKE ONE (1) TABLET EACH DAY  . simvastatin (ZOCOR) 10 MG tablet Take 10 mg by mouth daily.  Marland Kitchen triamterene-hydrochlorothiazide (MAXZIDE-25) 37.5-25 MG tablet TAKE ONE-HALF TABLET BY MOUTH DAILY   No facility-administered encounter medications on file as of 09/04/2015.    Review of Systems  Constitutional: Negative for appetite change and unexpected weight change.  HENT: Negative for congestion and sinus pressure.   Respiratory: Negative for cough, chest tightness and shortness of breath.   Cardiovascular: Negative for chest pain, palpitations and leg swelling.  Gastrointestinal: Negative for nausea, vomiting, abdominal pain and diarrhea.  Genitourinary: Negative for dysuria and difficulty urinating.  Musculoskeletal: Positive for back pain. Negative for joint swelling.  Skin: Negative for color change and rash.  Neurological: Negative for dizziness, light-headedness and headaches.  Psychiatric/Behavioral: Negative for dysphoric mood and agitation.       Objective:    Physical Exam  Constitutional: He appears well-developed and well-nourished. No distress.  HENT:  Nose: Nose normal.  Mouth/Throat: Oropharynx is clear and moist.  Neck: Neck supple. No thyromegaly present.  Cardiovascular: Normal rate and regular rhythm.   Pulmonary/Chest: Effort normal and  breath sounds normal. No respiratory distress.  Abdominal: Soft. Bowel sounds are normal. There is no tenderness.  Musculoskeletal: He exhibits no edema or tenderness.  Lymphadenopathy:    He has no cervical adenopathy.  Skin: No rash noted. No erythema.  Psychiatric: He has a normal mood and affect. His behavior is normal.    BP 122/78 mmHg  Pulse 56  Temp(Src) 98.4 F (36.9 C) (Oral)  Resp 18  Ht 6\' 4"  (1.93 m)  Wt 256 lb 6 oz (116.291 kg)  BMI 31.22 kg/m2  SpO2 96% Wt Readings from Last 3 Encounters:  09/04/15 256  lb 6 oz (116.291 kg)  07/16/15 245 lb 12 oz (111.471 kg)  07/11/15 242 lb (109.77 kg)     Lab Results  Component Value Date   WBC 7.1 11/01/2013   HGB 15.5 11/01/2013   HCT 47.3 11/01/2013   PLT 241.0 11/01/2013   GLUCOSE 90 05/04/2015   CHOL 132 05/04/2015   TRIG 67.0 05/04/2015   HDL 45.40 05/04/2015   LDLCALC 73 05/04/2015   ALT 51 05/04/2015   AST 29 05/04/2015   NA 143 05/04/2015   K 4.6 05/04/2015   CL 107 05/04/2015   CREATININE 0.93 05/04/2015   BUN 21 05/04/2015   CO2 30 05/04/2015   TSH 1.74 04/27/2014   PSA 0.27 05/04/2015    Mr Lumbar Spine Wo Contrast  07/25/2015  CLINICAL DATA:  Low back pain and bilateral buttock pain and posterior leg pain. EXAM: MRI LUMBAR SPINE WITHOUT CONTRAST TECHNIQUE: Multiplanar, multisequence MR imaging of the lumbar spine was performed. No intravenous contrast was administered. COMPARISON:  Radiographs dated 08/11/2014 FINDINGS: Normal conus tip at L2. There is an incompletely visualized large left renal cyst measuring at least 20 cm in length. There is a 1.5 cm cyst on the upper pole of the right kidney. T12-L1:  Normal. L1-2: 2 mm spondylolisthesis with a small broad-based bulge of the uncovered disc. 18 mm benign hemangioma in the right side of the L2. L2-3: 2 mm spondylolisthesis with a small broad-based disc bulge, most prominent foraminal and extra foraminal on the right. L3-4: 5 mm spondylolisthesis with a broad-based bulge of the uncovered disc with a small protrusion into the left neural foramen. Hypertrophy of the ligamentum flavum and facet joints combine to create severe spinal stenosis with marked compression of both lateral recesses and moderate foraminal stenosis although a L3 nerves exit without impingement. Severe bilateral facet arthritis. L4-5: Small Schmorl's node in the superior endplate of L5. The disc is otherwise normal. L5-S1: Soft disc protrusion central and to the left extending foraminal and extra foraminal. The left  L5 nerve appears to exit without impingement. There is no mass effect in the left lateral recess which could affect the left S1 nerve. IMPRESSION: 1. Severe spinal stenosis at L3-4 with compression of both lateral recesses which should affect the L4 nerves. 2. Soft disc protrusion at L5-S1 to the left which could affect the left S1 nerve. Electronically Signed   By: Lorriane Shire M.D.   On: 07/25/2015 10:41       Assessment & Plan:   Problem List Items Addressed This Visit    Back pain    MRI as outlined.  Saw Dr Velna Ochs.  Planning for surgery 09/28/15.        GERD (gastroesophageal reflux disease)    Symptoms controlled on protonix.  Avoid antiinflammatories.  Follow.        Hypercholesterolemia    On simvastatin.  Low  cholesterol diet and exercise.  Follow lipid panel and liver function tests.        Hypertension - Primary    Blood pressure under good control.  Continue same medication regimen.  Follow pressures.  Follow metabolic panel.  Will need close intra op and post op monitoring of her blood pressure and pulse to avoid extremes.         Prostate cancer [facility])    S/p retropubic prostatectomy.  PSA 05/04/15 - .27.  Follow.       SOB (shortness of breath) on exertion    This was an issue previously.  Saw Dr Fletcher Anon.  States had stress test.  States ok.  Need results.  Will need results to confirm stress test ok, for cardiac recommendations prior to surgery.  Will obtain results.  Currently asymptomatic.  See above.            Einar Pheasant, MD

## 2015-09-06 NOTE — Assessment & Plan Note (Signed)
MRI as outlined.  Saw Dr Velna Ochs.  Planning for surgery 09/28/15.

## 2015-09-06 NOTE — Assessment & Plan Note (Signed)
Symptoms controlled on protonix.  Avoid antiinflammatories.  Follow.

## 2015-09-06 NOTE — Telephone Encounter (Signed)
Rosann Auerbach called from Dr Velna Ochs office regarding needing the office visit from 09/04/15 and note stating that the pt is cleared for surgery. Fax [phone]. Attn to [facility] the Nurse. Lumbar surgery. Thank You!

## 2015-09-06 NOTE — Assessment & Plan Note (Signed)
Blood pressure under good control.  Continue same medication regimen.  Follow pressures.  Follow metabolic panel.  Will need close intra op and post op monitoring of her blood pressure and pulse to avoid extremes.

## 2015-09-06 NOTE — Telephone Encounter (Signed)
I went to print this note, Dr. Nicki Reaper are you finished with it? Thanks, please advise when it is done.  thanks

## 2015-09-06 NOTE — Assessment & Plan Note (Signed)
S/p retropubic prostatectomy.  PSA 05/04/15 - .27.  Follow.

## 2015-09-06 NOTE — Telephone Encounter (Signed)
Note is complete.  I cannot find the stress test results from Dr Fletcher Anon.  Please call Dr Tyrell Antonio office and see if they can forward the results of his stress test.  The surgeon requested a copy of the test.  Please hold until receive.  Thanks

## 2015-09-06 NOTE — Assessment & Plan Note (Signed)
This was an issue previously.  Saw Dr Fletcher Anon.  States had stress test.  States ok.  Need results.  Will need results to confirm stress test ok, for cardiac recommendations prior to surgery.  Will obtain results.  Currently asymptomatic.  See above.

## 2015-09-07 NOTE — Telephone Encounter (Signed)
Faxed requested information to the requested office.  Thanks

## 2015-09-18 ENCOUNTER — Ambulatory Visit: Payer: Medicare Other | Admitting: Internal Medicine

## 2015-09-18 ENCOUNTER — Other Ambulatory Visit: Payer: Medicare Other

## 2015-09-30 ENCOUNTER — Encounter: Payer: Self-pay | Admitting: Internal Medicine

## 2015-10-01 ENCOUNTER — Other Ambulatory Visit: Payer: Self-pay | Admitting: Internal Medicine

## 2015-10-03 ENCOUNTER — Encounter: Payer: Self-pay | Admitting: Internal Medicine

## 2015-10-03 ENCOUNTER — Telehealth: Payer: Self-pay

## 2015-10-03 NOTE — Telephone Encounter (Signed)
Ms. Edward James states that bp 90/80 in r arm, heart rate was erratic, wanted to speak with the doctor.

## 2015-10-04 ENCOUNTER — Telehealth: Payer: Self-pay

## 2015-10-04 ENCOUNTER — Encounter: Payer: Self-pay | Admitting: Internal Medicine

## 2015-10-04 NOTE — Telephone Encounter (Signed)
S/w Dr. Nicki Reaper who requests appt for pt as he was recently diagnosed w/new onset afib.  Pt added on schedule Feb 7, 1:15am S/w pt who is agreeable w/appt.

## 2015-10-04 NOTE — Telephone Encounter (Signed)
Please advise 

## 2015-10-09 ENCOUNTER — Encounter: Payer: Self-pay | Admitting: Cardiovascular Disease

## 2015-10-09 ENCOUNTER — Ambulatory Visit (INDEPENDENT_AMBULATORY_CARE_PROVIDER_SITE_OTHER): Payer: Medicare Other | Admitting: Cardiovascular Disease

## 2015-10-09 VITALS — BP 108/64 | HR 76 | Ht 76.0 in | Wt 250.5 lb

## 2015-10-09 DIAGNOSIS — I4891 Unspecified atrial fibrillation: Secondary | ICD-10-CM

## 2015-10-09 DIAGNOSIS — I480 Paroxysmal atrial fibrillation: Secondary | ICD-10-CM | POA: Diagnosis not present

## 2015-10-09 DIAGNOSIS — I10 Essential (primary) hypertension: Secondary | ICD-10-CM

## 2015-10-09 NOTE — Progress Notes (Signed)
Primary care physician: Dr. Einar Pheasant.  HPI  This is a pleasant 75 year old man who is here today for a recent episode of atrial fibrillation. He was seen by me recently for exertional dyspnea.  He has known history of hypertension, hyperlipidemia, chronic back pain and GERD. He is not a diabetic and does not smoke.He has no family history of coronary artery disease.  He underwent a nuclear stress test which showed no evidence of ischemia with normal ejection fraction. He recently underwent lumbar surgery. A Foley catheter had to be placed and it was left after hospital discharge. The patient went to follow-up with his urologist take the Foley catheter out. He was nothing by mouth for about 6 hours and might have been slightly volume depleted. He was noted to be mildly tachycardic and thus he was sent to the emergency room at [facility]. He was noted to be in atrial fibrillation with a heart rate of 120 bpm. He was given IV fluids and one dose of by mouth metoprolol and converted to normal sinus rhythm. The patient was completely asymptomatic during this episode. He had no prior episodes of atrial fibrillation. He denies palpitations or syncope.   No Known Allergies   Current Outpatient Prescriptions on File Prior to Visit  Medication Sig Dispense Refill  . aspirin 81 MG tablet Take 81 mg by mouth daily.    Marland Kitchen lisinopril (PRINIVIL,ZESTRIL) 20 MG tablet TAKE ONE (1) TABLET EACH DAY 90 tablet 1  . pantoprazole (PROTONIX) 40 MG tablet TAKE ONE (1) TABLET EACH DAY 90 tablet 3  . simvastatin (ZOCOR) 10 MG tablet TAKE 1 TABLET BY MOUTH EVERY DAY. 90 tablet 1  . triamterene-hydrochlorothiazide (MAXZIDE-25) 37.5-25 MG tablet TAKE ONE-HALF TABLET BY MOUTH DAILY 45 tablet 3   No current facility-administered medications on file prior to visit.     Past Medical History  Diagnosis Date  . Prostate cancer [facility])     s/p radical retropubic prostatectomy 1999  . Hypertension   . Hypercholesterolemia   .  GERD (gastroesophageal reflux disease)   . Diverticulosis      Past Surgical History  Procedure Laterality Date  . Retropubic prostatectomy      radical (1999)  . Pilonidal cyst removal  1969  . Inguinal hernia repair  1945  . Tonsillectomy and adenoidectomy  1948  . Foley catheter       Family History  Problem Relation Age of Onset  . Nephrolithiasis    . Colon cancer Neg Hx   . AAA (abdominal aortic aneurysm) Father      Social History   Social History  . Marital Status: Married    Spouse Name: N/A  . Number of Children: 3  . Years of Education: N/A   Occupational History  . Not on file.   Social History Main Topics  . Smoking status: Former Research scientist (life sciences)  . Smokeless tobacco: Never Used  . Alcohol Use: No     Comment: occasional  . Drug Use: No  . Sexual Activity: Not on file   Other Topics Concern  . Not on file   Social History Narrative     ROS A 10 point review of system was performed. It is negative other than that mentioned in the history of present illness.   PHYSICAL EXAM   BP 108/64 mmHg  Pulse 76  Ht 6\' 4"  (1.93 m)  Wt 250 lb 8 oz (113.626 kg)  BMI 30.50 kg/m2 Constitutional: He is oriented to person, place, and time.  He appears well-developed and well-nourished. No distress.  HENT: No nasal discharge.  Head: Normocephalic and atraumatic.  Eyes: Pupils are equal and round.  No discharge. Neck: Normal range of motion. Neck supple. No JVD present. No thyromegaly present.  Cardiovascular: Normal rate, regular rhythm, normal heart sounds. Exam reveals no gallop and no friction rub. No murmur heard.  Pulmonary/Chest: Effort normal and breath sounds normal. No stridor. No respiratory distress. He has no wheezes. He has no rales. He exhibits no tenderness.  Abdominal: Soft. Bowel sounds are normal. He exhibits no distension. There is no tenderness. There is no rebound and no guarding.  Musculoskeletal: Normal range of motion. He exhibits no edema  and no tenderness.  Neurological: He is alert and oriented to person, place, and time. Coordination normal.  Skin: Skin is warm and dry. No rash noted. He is not diaphoretic. No erythema. No pallor.  Psychiatric: He has a normal mood and affect. His behavior is normal. Judgment and thought content normal.       EKG: normal sinus rhythm with left anterior fascicular block and nonspecific ST changes.   ASSESSMENT AND PLAN

## 2015-10-09 NOTE — Assessment & Plan Note (Signed)
Blood pressure is controlled on current medications. 

## 2015-10-09 NOTE — Assessment & Plan Note (Signed)
The patient had one brief episode of what seems to be atrial fibrillation recently which might have been triggered by some volume depletion. The patient was completely asymptomatic. Given that this was his first documented episode, I don't think this required treatment at the present time. He was also asymptomatic during the episode. I asked his wife to continue monitoring his blood pressure and heart rate at home over the next few weeks. I requested an echocardiogram to evaluate atrial size. If he develops recurrent atrial fibrillation and I recommend treatment with a beta blocker or calcium channel blocker. CHADS VASc score is 2. Thus, he would require anticoagulation if he develops recurrent atrial fibrillation.

## 2015-10-09 NOTE — Patient Instructions (Signed)
Medication Instructions:  Your physician recommends that you continue on your current medications as directed. Please refer to the Current Medication list given to you today.   Labwork: none  Testing/Procedures: Your physician has requested that you have an echocardiogram. Echocardiography is a painless test that uses sound waves to create images of your heart. It provides your doctor with information about the size and shape of your heart and how well your heart's chambers and valves are working. This procedure takes approximately one hour. There are no restrictions for this procedure.    Follow-Up: Your physician recommends that you schedule a follow-up appointment in: three months with Dr. Fletcher Anon.    Any Other Special Instructions Will Be Listed Below (If Applicable).     If you need a refill on your cardiac medications before your next appointment, please call your pharmacy.  Echocardiogram An echocardiogram, or echocardiography, uses sound waves (ultrasound) to produce an image of your heart. The echocardiogram is simple, painless, obtained within a short period of time, and offers valuable information to your health care provider. The images from an echocardiogram can provide information such as:  Evidence of coronary artery disease (CAD).  Heart size.  Heart muscle function.  Heart valve function.  Aneurysm detection.  Evidence of a past heart attack.  Fluid buildup around the heart.  Heart muscle thickening.  Assess heart valve function. LET [facility] CARE PROVIDER KNOW ABOUT:  Any allergies you have.  All medicines you are taking, including vitamins, herbs, eye drops, creams, and over-the-counter medicines.  Previous problems you or members of your family have had with the use of anesthetics.  Any blood disorders you have.  Previous surgeries you have had.  Medical conditions you have.  Possibility of pregnancy, if this applies. BEFORE THE PROCEDURE   No special preparation is needed. Eat and drink normally.  PROCEDURE   In order to produce an image of your heart, gel will be applied to your chest and a wand-like tool (transducer) will be moved over your chest. The gel will help transmit the sound waves from the transducer. The sound waves will harmlessly bounce off your heart to allow the heart images to be captured in real-time motion. These images will then be recorded.  You may need an IV to receive a medicine that improves the quality of the pictures. AFTER THE PROCEDURE You may return to your normal schedule including diet, activities, and medicines, unless your health care provider tells you otherwise.   This information is not intended to replace advice given to you by your health care provider. Make sure you discuss any questions you have with your health care provider.   Document Released: 08/15/2000 Document Revised: 09/08/2014 Document Reviewed: 04/25/2013 Elsevier Interactive Patient Education [employer].

## 2015-10-12 ENCOUNTER — Encounter: Payer: Self-pay | Admitting: Cardiovascular Disease

## 2015-10-18 ENCOUNTER — Other Ambulatory Visit: Payer: Self-pay

## 2015-10-18 ENCOUNTER — Ambulatory Visit (INDEPENDENT_AMBULATORY_CARE_PROVIDER_SITE_OTHER): Payer: Medicare Other

## 2015-10-18 DIAGNOSIS — I4891 Unspecified atrial fibrillation: Secondary | ICD-10-CM | POA: Diagnosis not present

## 2015-11-08 ENCOUNTER — Encounter: Payer: Self-pay | Admitting: Internal Medicine

## 2015-11-09 ENCOUNTER — Encounter: Payer: Self-pay | Admitting: Internal Medicine

## 2015-11-09 DIAGNOSIS — K469 Unspecified abdominal hernia without obstruction or gangrene: Secondary | ICD-10-CM

## 2015-11-09 NOTE — Telephone Encounter (Signed)
Checked with [employer]. Unsure was he talking about General surgery referral?

## 2015-11-10 NOTE — Telephone Encounter (Signed)
Order placed for referral to surgery.  

## 2015-11-26 ENCOUNTER — Other Ambulatory Visit: Payer: Self-pay | Admitting: Internal Medicine

## 2015-11-27 ENCOUNTER — Telehealth: Payer: Self-pay | Admitting: *Deleted

## 2015-11-27 ENCOUNTER — Other Ambulatory Visit (INDEPENDENT_AMBULATORY_CARE_PROVIDER_SITE_OTHER): Payer: Medicare Other

## 2015-11-27 DIAGNOSIS — C61 Malignant neoplasm of prostate: Secondary | ICD-10-CM | POA: Diagnosis not present

## 2015-11-27 DIAGNOSIS — E7800 Pure hypercholesterolemia, unspecified: Secondary | ICD-10-CM

## 2015-11-27 DIAGNOSIS — I480 Paroxysmal atrial fibrillation: Secondary | ICD-10-CM

## 2015-11-27 LAB — HEPATIC FUNCTION PANEL
ALK PHOS: 73 U/L (ref 39–117)
ALT: 24 U/L (ref 0–53)
AST: 23 U/L (ref 0–37)
Albumin: 4.3 g/dL (ref 3.5–5.2)
BILIRUBIN DIRECT: 0.1 mg/dL (ref 0.0–0.3)
BILIRUBIN TOTAL: 0.6 mg/dL (ref 0.2–1.2)
Total Protein: 7 g/dL (ref 6.0–8.3)

## 2015-11-27 LAB — BASIC METABOLIC PANEL
BUN: 20 mg/dL (ref 6–23)
CO2: 29 mEq/L (ref 19–32)
CREATININE: 0.89 mg/dL (ref 0.40–1.50)
Calcium: 9.6 mg/dL (ref 8.4–10.5)
Chloride: 106 mEq/L (ref 96–112)
GFR: 89.06 mL/min (ref >60.00)
GLUCOSE: 99 mg/dL (ref 70–99)
Potassium: 4.6 mEq/L (ref 3.5–5.1)
Sodium: 142 mEq/L (ref 135–145)

## 2015-11-27 LAB — CBC WITH DIFFERENTIAL/PLATELET
BASOS ABS: 0 10*3/uL (ref 0.0–0.1)
BASOS PCT: 0.6 % (ref 0.0–3.0)
EOS ABS: 0.2 10*3/uL (ref 0.0–0.7)
Eosinophils Relative: 2.3 % (ref 0.0–5.0)
HCT: 42.2 % (ref 39.0–52.0)
HEMOGLOBIN: 14.1 g/dL (ref 13.0–17.0)
Lymphocytes Relative: 41.9 % (ref 12.0–46.0)
Lymphs Abs: 2.7 10*3/uL (ref 0.7–4.0)
MCHC: 33.5 g/dL (ref 30.0–36.0)
MCV: 84.1 fl (ref 78.0–100.0)
MONO ABS: 0.6 10*3/uL (ref 0.1–1.0)
Monocytes Relative: 8.8 % (ref 3.0–12.0)
NEUTROS PCT: 46.4 % (ref 43.0–77.0)
Neutro Abs: 3 10*3/uL (ref 1.4–7.7)
PLATELETS: 253 10*3/uL (ref 150.0–400.0)
RBC: 5.01 Mil/uL (ref 4.22–5.81)
RDW: 13.1 % (ref 11.5–15.5)
WBC: 6.5 10*3/uL (ref 4.0–10.5)

## 2015-11-27 LAB — LIPID PANEL
CHOLESTEROL: 144 mg/dL (ref 0–200)
HDL: 51.2 mg/dL (ref >39.00)
LDL Cholesterol: 81 mg/dL (ref 0–99)
NonHDL: 92.9
Total CHOL/HDL Ratio: 3
Triglycerides: 62 mg/dL (ref 0.0–149.0)
VLDL: 12.4 mg/dL (ref 0.0–40.0)

## 2015-11-27 LAB — TSH: TSH: 2.41 u[IU]/mL (ref 0.35–4.50)

## 2015-11-27 LAB — PSA, MEDICARE: PSA: 0.42 ng/mL (ref 0.10–4.00)

## 2015-11-27 NOTE — Telephone Encounter (Signed)
Pt coming in for fasting labs this morning. Need orders placed

## 2015-11-27 NOTE — Telephone Encounter (Signed)
Labs ordered.

## 2015-11-29 ENCOUNTER — Encounter
Admission: RE | Admit: 2015-11-29 | Discharge: 2015-11-29 | Disposition: A | Discharge status: Home / Self Care | Payer: Medicare Other | Source: Ambulatory Visit | Attending: Surgery | Admitting: Surgery

## 2015-11-29 DIAGNOSIS — Z01812 Encounter for preprocedural laboratory examination: Secondary | ICD-10-CM | POA: Insufficient documentation

## 2015-11-29 HISTORY — DX: Spinal stenosis, site unspecified: M48.00

## 2015-11-29 NOTE — Pre-Procedure Instructions (Signed)
Called Dr Lenna Sciara [facility] office for orders.

## 2015-11-29 NOTE — Pre-Procedure Instructions (Signed)
Department Care Team Description   11/26/2015 Initial consult [facility]  [place], [place] [address]  [phone]  Delrae Alfred., MD  [facility]  [place], [place] [address]  [phone]  [phone] (Fax)  Unilateral inguinal hernia without obstruction or gangrene, recurrence not specified (Primary Dx)  Social History - as of this encounter Tobacco Use Types Packs/Day Years Used Date  Former Smoker Cigarettes   Quit: 09/02/1967  Smokeless Tobacco: Never Used      Alcohol Use Drinks/Week oz/Week Comments  No 0 Standard drinks or equivalent  0.0     Birth Sex Date Recorded  Unknown   Last Filed Vital Signs - in this encounter Vital Sign Reading Time Taken  Blood Pressure 143/78 11/26/2015 1:22 PM EDT  Pulse 63 11/26/2015 1:22 PM EDT  Temperature 36.4 C (97.6 F) 11/26/2015 1:22 PM EDT  Respiratory Rate - -  Oxygen Saturation - -  Inhaled Oxygen Concentration - -  Weight 111.1 kg (245 lb) 11/26/2015 1:22 PM EDT  Height 193 cm (6\' 4" ) 11/26/2015 1:22 PM EDT  Body Mass Index 29.82 11/26/2015 1:22 PM EDT  Instructions - in this encounter Patient Instructions - Delrae Alfred., MD - 11/26/2015 1:30 PM EDT Schedule left inguinal hernia repair CPT [phone]  Obtain lab work scheduled for tomorrow.  Stop aspirin for 1 week before surgery.  Progress Notes - in this encounter Delrae Alfred., MD - 11/26/2015 1:30 PM EDT Formatting of this note may be different from the original.  HISTORY OF PRESENT ILLNESS:   Edward James is a 75 y.o. male who presents for consultation at the request of Dr. Nicki Reaper with chief complaint of  Chief Complaint  Patient presents with  . Inguinal Hernia  left   He reports a 3 year history of bulging in the left groin. It has gradually increased in size. Now has swelling extending down into the scrotum. He thinks that when he lays down at night the  size might subside some. He reports no significant pain at the site. He reports he has recently been eating satisfactorily and voiding satisfactorily and moving his bowels satisfactorily. He has had no associated nausea or vomiting.   PAST MEDICAL HISTORY:. He reports having a brief episode of atrial fibrillation postoperatively after his back surgery. He reports that before back surgery did have cardiac stress testing done by Dr.Arida which was normal.  He reports no history of pulmonary disease and no history of hepatitis Past Medical History  Diagnosis Date  . Arthritis  . Cancer (CMS-HCC)  prostate  . GERD (gastroesophageal reflux disease)  . Hypertension  .  PAST SURGICAL HISTORY:. His prostatectomy was 1999.  He is still improving from his lumbar back surgery which is done in January. He reports having fusion of L3 and L4. He does continue to wear a back brace. Past Surgical History  Procedure Laterality Date  . Prostatectomy perineal  . Tonsillectomy  . Colonoscopy  . Hernia repair  . Pilonidal cyst / sinus excision 1960's  . Posterior lumbar spine fusion one level lateral transverse technique Bilateral 09/28/2015  Procedure: BILATERAL LUMBAR DECOMPRESSION, STABILIZATION SPINAL FUSION L3-4, RIGHT ICBG; Surgeon: Loni Dolly, MD; Location: [facility]; Service: Orthopedics; Laterality: Bilateral;  . Laminectomy posterior lumbar facetectomy & foraminotomy w/decomp Bilateral 09/28/2015  Procedure: LAMINECTOMY, FACETECTOMY AND FORAMINOTOMY (UNILATERAL OR BILATERAL WITH DECOMPRESSION OF SPINAL CORD, CAUDA EQUINA AND/OR NERVE ROOT(S), SINGLE VERTEBRAL SEGMENT; LUMBAR; Surgeon: Marcello Moores  Loreen Freud, MD; Location: [facility]; Service: Orthopedics; Laterality: Bilateral;  . Laminectomy posterior cervicle decomp w/facetectomy & foraminotomy Bilateral 09/28/2015  Procedure: LAMINECTOMY, FACETECTOMY AND FORAMINOTOMY, SINGLE VERTEBRAL SEGMENT; EACH ADDITIONAL SEGMENT, CERVICAL, THORACIC, OR LUMBAR  (LIST IN ADDITION TO PRIMARY PROCEDURE); Surgeon: Loni Dolly, MD; Location: [facility]; Service: Orthopedics; Laterality: Bilateral;  . Autograft structural icbg obtained separate incision for spine surgery Right 09/28/2015  Procedure: AUTOGRAFT FOR SPINE SURGERY ONLY (INCL HARVESTING GRAFT); STRUCTURAL, BICORTICAL OR TRICORTICAL (THROUGH SEPARATE SKIN OR FASCIAL INCISION) (LIST IN ADDITION TO PRIMARY PROCEDURE); Surgeon: Loni Dolly, MD; Location: [facility]; Service: Orthopedics; Laterality: Right;  .  MEDICATIONS:. Outpatient Encounter Prescriptions as of 11/26/2015  Medication Sig Dispense Refill  . aspirin (ADULT LOW DOSE ASPIRIN) 81 MG EC tablet Take 81 mg by mouth once daily. Reported on 09/24/2015  . lisinopril (PRINIVIL,ZESTRIL) 20 MG tablet Take 20 mg by mouth once daily. TAKE ONE (1) TABLET EACH DAY  . omega-3 fatty acids/fish oil (FISH OIL) 340-1,000 mg capsule Take 1 capsule by mouth 2 (two) times daily. Reported on 09/24/2015  . pantoprazole (PROTONIX) 40 MG DR tablet Take 40 mg by mouth once daily. TAKE ONE (1) TABLET EACH DAY  . simvastatin (ZOCOR) 10 MG tablet Take 10 mg by mouth. TAKE ONE (1) TABLET EACH DAY  . triamcinolone (NASACORT AQ) 55 mcg nasal spray Place 2 sprays into both nostrils as needed.   . triamterene-hydrochlorothiazide (MAXZIDE-25) 37.5-25 mg tablet Take 0.5 tablets by mouth once daily. TAKE ONE-HALF TABLET BY MOUTH DAILY  . [DISCONTINUED] oxyCODONE-acetaminophen (PERCOCET) 5-325 mg tablet   No facility-administered encounter medications on file as of 11/26/2015.  Marland Kitchen  ALLERGIES:. Review of patient's allergies indicates no known allergies..  SOCIAL HISTORY:. 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 Marital status: Married  Spouse name: N/A  . Number of children: N/A  . Years of education: N/A   Social History Main Topics  . Smoking status: Former Smoker  Types: Cigarettes  Quit date:  09/02/1967  . Smokeless tobacco: Never Used  . Alcohol use No  . Drug use: No  . Sexual activity: Not Asked   Other Topics Concern  . None   Social History Narrative  .  FAMILY HISTORY:. Family History  Problem Relation Age of Onset  . Arthritis Mother  . Nephrolithiasis Father  .  GENERAL REVIEW OF SYSTEMS: He reports no recent acute illness such as cough cold or sore throat. Does report some decrease in activity tolerance since his back surgery. He reports no dyspnea and no chest pain. He reports some occasional constipation related to back surgery. He reports recently has been voiding satisfactorily. He did have to have a catheter inserted before his back surgery and was taken out several days later. He does report some joint stiffness and pain. He has some moderate discomfort in his back and is wearing a brace. He reports easy bruising. He reports no swelling in his legs. Review of systems otherwise negative.  PHYSICAL EXAM: Vitals:  11/26/15 1322  BP: 143/78  Pulse: 63  Temp: 36.4 C (97.6 F)   Ht:193 cm (6\' 4" ) Wt:(!) 111.1 kg (245 lb) [id number] surface area is 2.44 meters squared. Body mass index is 29.82 kg/(m^2).Marland Kitchen  The patient is awake alert oriented ambulatory and in no acute distress.  SKIN: Warm and dry without rash.  HEENT: Pupils equal reactive to light. Extraocular movements are intact. Sclera clear. Palpebral conjunctiva normal  red color.Pharynx clear.  NECK: Supple with no palpable mass and no adenopathy.  LUNGS: Sound clear with no rales rhonchi or wheezes.  HEART: Regular rhythm S1 and S2 without murmur.  ABDOMEN: Was examined both standing and supine. Soft obese and nontender with no palpable mass, no hepatomegaly. With him standing on noted there is bulging that extends from above the pubic bone down into the scrotum. With him sometime this is to surgery reducible. It is accentuated with coughing. There was no significant tenderness at this  site.  GENITALIA: Testicles are bilaterally descended with no abnormal scrotal mass.  EXTREMITIES: Well-developed well-nourished symmetrical with no dependent edema.  NEUROLOGICAL: Awake alert oriented, facial expression symmetrical, moving all extremities.  CLINICAL DATA: Recent CBC on 10/03/2015 was with a white blood count of 11,400, hemoglobin 12.4, platelet count 353,000 chemistry panel on 10/03/2015 was with a creatinine of 0.72 a potassium of 4 and glucose 109. Prothrombin time on 10/03/2015 was 12.9  IMPRESSION:.   Left inguinal hernia   PLAN:.   I recommended left internal hernia repair. I discussed the operation care risk and benefits with him in detail.  Patient Instructions  Schedule left inguinal hernia repair CPT [phone]  Obtain lab work scheduled for tomorrow.  Stop aspirin for 1 week before surgery.  Rochel Brome, MD  Electronically signed by Rochel Brome, MD

## 2015-11-29 NOTE — Patient Instructions (Signed)
  Your procedure is scheduled on: 12/04/15 Tues Report to Day Surgery.2nd floor medical mall To find out your arrival time please call [phone] between 1PM - 3PM on 12/03/15 Mon.  Remember: Instructions that are not followed completely may result in serious medical risk, up to and including death, or upon the discretion of your surgeon and anesthesiologist your surgery may need to be rescheduled.    _x___ 1. Do not eat food or drink liquids after midnight. No gum chewing or hard candies.     ____ 2. No Alcohol for 24 hours before or after surgery.   ____ 3. Bring all medications with you on the day of surgery if instructed.    __x__ 4. Notify your doctor if there is any change in your medical condition     (cold, fever, infections).     Do not wear jewelry, make-up, hairpins, clips or nail polish.  Do not wear lotions, powders, or perfumes. You may wear deodorant.  Do not shave 48 hours prior to surgery. Men may shave face and neck.  Do not bring valuables to the hospital.    [facility] is not responsible for any belongings or valuables.               Contacts, dentures or bridgework may not be worn into surgery.  Leave your suitcase in the car. After surgery it may be brought to your room.  For patients admitted to the hospital, discharge time is determined by your                treatment team.   Patients discharged the day of surgery will not be allowed to drive home.   Please read over the following fact sheets that you were given:      _x___ Take these medicines the morning of surgery with A SIP OF WATER:    1. lisinopril (PRINIVIL,ZESTRIL) 20 MG tablet  2. pantoprazole (PROTONIX) 40 MG tablet  3. oxyCODONE-acetaminophen (PERCOCET/ROXICET) 5-325 MG tablet if needed  4.  5.  6.  ____ Fleet Enema (as directed)   _x___ Use CHG Soap as directed  ____ Use inhalers on the day of surgery  ____ Stop metformin 2 days prior to surgery    ____ Take 1/2 of usual insulin  dose the night before surgery and none on the morning of surgery.   _x___ Stop Coumadin/Plavix/aspirin on stop Aspirin per Dr Thompson Caul instructions  ____ Stop Anti-inflammatories on    _x___ Stop supplements until after surgery.  Stop fish oil 1 week before surgery  ____ Bring C-Pap to the hospital.

## 2015-11-30 ENCOUNTER — Encounter: Payer: Self-pay | Admitting: Internal Medicine

## 2015-12-03 ENCOUNTER — Encounter: Payer: Self-pay | Admitting: Internal Medicine

## 2015-12-03 ENCOUNTER — Ambulatory Visit (INDEPENDENT_AMBULATORY_CARE_PROVIDER_SITE_OTHER): Payer: Medicare Other | Admitting: Internal Medicine

## 2015-12-03 VITALS — BP 128/80 | HR 54 | Temp 98.2°F | Resp 18 | Ht 76.0 in | Wt 254.5 lb

## 2015-12-03 DIAGNOSIS — Z0000 Encounter for general adult medical examination without abnormal findings: Secondary | ICD-10-CM | POA: Diagnosis not present

## 2015-12-03 DIAGNOSIS — I10 Essential (primary) hypertension: Secondary | ICD-10-CM | POA: Diagnosis not present

## 2015-12-03 DIAGNOSIS — I480 Paroxysmal atrial fibrillation: Secondary | ICD-10-CM

## 2015-12-03 DIAGNOSIS — K219 Gastro-esophageal reflux disease without esophagitis: Secondary | ICD-10-CM

## 2015-12-03 DIAGNOSIS — Z8546 Personal history of malignant neoplasm of prostate: Secondary | ICD-10-CM

## 2015-12-03 DIAGNOSIS — K4090 Unilateral inguinal hernia, without obstruction or gangrene, not specified as recurrent: Secondary | ICD-10-CM | POA: Insufficient documentation

## 2015-12-03 DIAGNOSIS — E7800 Pure hypercholesterolemia, unspecified: Secondary | ICD-10-CM

## 2015-12-03 DIAGNOSIS — M545 Low back pain: Secondary | ICD-10-CM

## 2015-12-03 NOTE — Assessment & Plan Note (Signed)
PSA recently checked .42.  Has varied.  Will recheck in 6 months.  He will notify me if wants to reestablish with urology.

## 2015-12-03 NOTE — Assessment & Plan Note (Addendum)
Had post op afib.  Appears to be in SR today on exam.  No chest pain or tightness.  No sob.  Blood pressure doing well.  Called Dr Fletcher Anon to inform of surgery and question of any pre op/peri op recommendations.    Dr Tyrell Antonio office called back.  No CI to proceed with surgery.  Will need close intra op and post op monitoring of her heart rate and blood pressure to avoid extremes.

## 2015-12-03 NOTE — Assessment & Plan Note (Signed)
Blood pressure under good control.  Continue same medication regimen.  Follow pressures.  Follow metabolic panel.   

## 2015-12-03 NOTE — Assessment & Plan Note (Signed)
Better s/p surgery.  Continue f/u with ortho.

## 2015-12-03 NOTE — Assessment & Plan Note (Addendum)
Planning for surgery tomorrow.  Had post op afib after last surgery.  See Dr Tyrell Antonio note for details.  Discussed with Dr Fletcher Anon.  Felt no further cardiac testing warranted prior to surgery.  Will need close intra op and post op monitoring of his heart rate and blood pressure to avoid extremes.

## 2015-12-03 NOTE — Progress Notes (Signed)
Patient ID: Edward James, male   DOB: 1940-11-22, 75 y.o.   MRN: [id number]   Subjective:    Patient ID: Edward James, male    DOB: 1941-08-16, 75 y.o.   MRN: [id number]  HPI  Patient here for his physical exam.  He informed me today that he was planning to have hernia repair tomorrow.  Dr Tamala Julian is going to do the surgery.  He is having no pain with the hernia, just states ready to have repaired.  He is s/p lumbar decompression, spinal fusion and bone graft 09/28/15.  Had post op afib.  Saw Dr Fletcher Anon.  Had ECHO.  Reviewed.  States everything checked out fine.  He reports energy better.  No chest pain or tightness.  No increased heart rate or palpitations.  No sob.  No acid reflux.  No abdominal pain or cramping.  Bowels stable.  Urinating fine. Some constipation after the surgery, but reports bowels ok now.     Past Medical History  Diagnosis Date  . Prostate cancer [facility])     s/p radical retropubic prostatectomy 1999  . Hypertension   . Hypercholesterolemia   . GERD (gastroesophageal reflux disease)   . Diverticulosis   . Spinal stenosis    Past Surgical History  Procedure Laterality Date  . Retropubic prostatectomy      radical (1999)  . Pilonidal cyst removal  1969  . Inguinal hernia repair  1945  . Tonsillectomy and adenoidectomy  1948  . Foley catheter    . Lumbar fusion      L3-4 fusion   Family History  Problem Relation Age of Onset  . Nephrolithiasis    . Colon cancer Neg Hx   . AAA (abdominal aortic aneurysm) Father    Social History   Social History  . Marital Status: Married    Spouse Name: N/A  . Number of Children: 3  . Years of Education: N/A   Social History Main Topics  . Smoking status: Former Smoker    Quit date: 11/28/1973  . Smokeless tobacco: Never Used  . Alcohol Use: No     Comment: occasional  . Drug Use: No  . Sexual Activity: Not Asked   Other Topics Concern  . None   Social History Narrative    Outpatient Encounter Prescriptions as  of 12/03/2015  Medication Sig  . aspirin 81 MG tablet Take 81 mg by mouth daily.  Marland Kitchen lisinopril (PRINIVIL,ZESTRIL) 20 MG tablet TAKE 1 TABLET BY MOUTH EVERY DAY.  Marland Kitchen Omega-3 Fatty Acids (OMEGA-3 FISH OIL PO) Take 2 capsules by mouth daily. Takes 1 tablet by mouth twice daily.  Marland Kitchen oxyCODONE-acetaminophen (PERCOCET/ROXICET) 5-325 MG tablet Take 1 tablet by mouth every 6 (six) hours as needed for severe pain.  . pantoprazole (PROTONIX) 40 MG tablet TAKE ONE (1) TABLET EACH DAY  . simvastatin (ZOCOR) 10 MG tablet TAKE 1 TABLET BY MOUTH EVERY DAY. (Patient taking differently: qhs)  . triamterene-hydrochlorothiazide (MAXZIDE-25) 37.5-25 MG tablet TAKE ONE-HALF TABLET BY MOUTH DAILY   No facility-administered encounter medications on file as of 12/03/2015.    Review of Systems  Constitutional: Negative for appetite change and unexpected weight change.  HENT: Negative for congestion and sinus pressure.   Eyes: Negative for pain and visual disturbance.  Respiratory: Negative for cough, chest tightness and shortness of breath.   Cardiovascular: Negative for chest pain, palpitations and leg swelling.  Gastrointestinal: Negative for nausea, vomiting, abdominal pain and diarrhea.  Genitourinary: Negative for dysuria and difficulty urinating.  Musculoskeletal: Negative for joint swelling.       Back is doing well s/p surgery.    Skin: Negative for color change and rash.  Neurological: Negative for dizziness, light-headedness and headaches.  Hematological: Negative for adenopathy. Does not bruise/bleed easily.  Psychiatric/Behavioral: Negative for dysphoric mood and agitation.       Objective:     Blood pressure rechecked by me:  136/80  Physical Exam  Constitutional: He is oriented to person, place, and time. He appears well-developed and well-nourished. No distress.  HENT:  Head: Normocephalic and atraumatic.  Nose: Nose normal.  Mouth/Throat: Oropharynx is clear and moist. No oropharyngeal  exudate.  Eyes: Conjunctivae are normal. Right eye exhibits no discharge. Left eye exhibits no discharge.  Neck: Neck supple. No thyromegaly present.  Cardiovascular: Normal rate and regular rhythm.   Pulmonary/Chest: Breath sounds normal. No respiratory distress. He has no wheezes.  Abdominal: Soft. Bowel sounds are normal. There is no tenderness.  Genitourinary:  Not performed.   Musculoskeletal: He exhibits no edema or tenderness.  Lymphadenopathy:    He has no cervical adenopathy.  Neurological: He is alert and oriented to person, place, and time.  Skin: Skin is warm and dry. No rash noted. No erythema.  Psychiatric: He has a normal mood and affect. His behavior is normal.    BP 128/80 mmHg  Pulse 54  Temp(Src) 98.2 F (36.8 C) (Oral)  Resp 18  Ht 6\' 4"  (1.93 m)  Wt 254 lb 8 oz (115.44 kg)  BMI 30.99 kg/m2  SpO2 97% Wt Readings from Last 3 Encounters:  12/03/15 254 lb 8 oz (115.44 kg)  11/29/15 250 lb (113.399 kg)  10/09/15 250 lb 8 oz (113.626 kg)     Lab Results  Component Value Date   WBC 6.5 11/27/2015   HGB 14.1 11/27/2015   HCT 42.2 11/27/2015   PLT 253.0 11/27/2015   GLUCOSE 99 11/27/2015   CHOL 144 11/27/2015   TRIG 62.0 11/27/2015   HDL 51.20 11/27/2015   LDLCALC 81 11/27/2015   ALT 24 11/27/2015   AST 23 11/27/2015   NA 142 11/27/2015   K 4.6 11/27/2015   CL 106 11/27/2015   CREATININE 0.89 11/27/2015   BUN 20 11/27/2015   CO2 29 11/27/2015   TSH 2.41 11/27/2015   PSA 0.42 11/27/2015       Assessment & Plan:   Problem List Items Addressed This Visit    Back pain    Better s/p surgery.  Continue f/u with ortho.        GERD (gastroesophageal reflux disease)    On protonix.  Symptoms controlled.        Health care maintenance    Physical today 12/03/15.  PSA 11/27/15 - .42.  Follow.  Colonoscopy 04/11/14 - colonoscopy.        History of prostate cancer    PSA recently checked .42.  Has varied.  Will recheck in 6 months.  He will notify me  if wants to reestablish with urology.        Relevant Orders   PSA, Medicare   Hypercholesterolemia    On simvastatin.  Low cholesterol diet and exercise.  Recent LDL 81.  Follow lipid panel and liver function tests.        Relevant Orders   Lipid panel   Hepatic function panel   Hypertension    Blood pressure under good control.  Continue same medication regimen.  Follow pressures.  Follow metabolic panel.  Relevant Orders   Basic metabolic panel   Inguinal hernia    Planning for surgery tomorrow.  Had post op afib after last surgery.  See Dr Tyrell Antonio note for details.  Discussed with Dr Fletcher Anon.  Felt no further cardiac testing warranted prior to surgery.  Will need close intra op and post op monitoring of his heart rate and blood pressure to avoid extremes.        Paroxysmal atrial fibrillation (HCC) - Primary    Had post op afib.  Appears to be in SR today on exam.  No chest pain or tightness.  No sob.  Blood pressure doing well.  Called Dr Fletcher Anon to inform of surgery and question of any pre op/peri op recommendations.    Dr Tyrell Antonio office called back.  No CI to proceed with surgery.  Will need close intra op and post op monitoring of her heart rate and blood pressure to avoid extremes.            Einar Pheasant, MD

## 2015-12-03 NOTE — Assessment & Plan Note (Signed)
Physical today 12/03/15.  PSA 11/27/15 - .42.  Follow.  Colonoscopy 04/11/14 - colonoscopy.

## 2015-12-03 NOTE — Assessment & Plan Note (Signed)
On protonix.  Symptoms controlled.   

## 2015-12-03 NOTE — Progress Notes (Signed)
Pre-visit discussion using our clinic review tool. No additional management support is needed unless otherwise documented below in the visit note.  

## 2015-12-03 NOTE — Assessment & Plan Note (Signed)
On simvastatin.  Low cholesterol diet and exercise.  Recent LDL 81.  Follow lipid panel and liver function tests.

## 2015-12-04 ENCOUNTER — Encounter: Payer: Self-pay | Admitting: Internal Medicine

## 2015-12-04 ENCOUNTER — Ambulatory Visit: Payer: Medicare Other | Admitting: Anesthesiology

## 2015-12-04 ENCOUNTER — Encounter: Payer: Self-pay | Admitting: *Deleted

## 2015-12-04 ENCOUNTER — Encounter
Admission: RE | Disposition: A | Discharge status: Home / Self Care | Payer: Self-pay | Source: Ambulatory Visit | Attending: Surgery

## 2015-12-04 ENCOUNTER — Ambulatory Visit
Admission: RE | Admit: 2015-12-04 | Discharge: 2015-12-04 | Disposition: A | Discharge status: Home / Self Care | Payer: Medicare Other | Source: Ambulatory Visit | Attending: Surgery | Admitting: Surgery

## 2015-12-04 DIAGNOSIS — Z7982 Long term (current) use of aspirin: Secondary | ICD-10-CM | POA: Diagnosis not present

## 2015-12-04 DIAGNOSIS — Z8546 Personal history of malignant neoplasm of prostate: Secondary | ICD-10-CM | POA: Diagnosis not present

## 2015-12-04 DIAGNOSIS — Z87891 Personal history of nicotine dependence: Secondary | ICD-10-CM | POA: Diagnosis not present

## 2015-12-04 DIAGNOSIS — I10 Essential (primary) hypertension: Secondary | ICD-10-CM | POA: Diagnosis not present

## 2015-12-04 DIAGNOSIS — K4090 Unilateral inguinal hernia, without obstruction or gangrene, not specified as recurrent: Secondary | ICD-10-CM | POA: Diagnosis not present

## 2015-12-04 DIAGNOSIS — D176 Benign lipomatous neoplasm of spermatic cord: Secondary | ICD-10-CM | POA: Diagnosis not present

## 2015-12-04 DIAGNOSIS — K219 Gastro-esophageal reflux disease without esophagitis: Secondary | ICD-10-CM | POA: Insufficient documentation

## 2015-12-04 DIAGNOSIS — Z79899 Other long term (current) drug therapy: Secondary | ICD-10-CM | POA: Insufficient documentation

## 2015-12-04 HISTORY — PX: INGUINAL HERNIA REPAIR: SHX194

## 2015-12-04 SURGERY — REPAIR, HERNIA, INGUINAL, ADULT
Anesthesia: General | Laterality: Left | Wound class: Clean

## 2015-12-04 MED ORDER — BUPIVACAINE-EPINEPHRINE (PF) 0.5% -1:200000 IJ SOLN
INTRAMUSCULAR | Status: DC | PRN
  Administered 2015-12-04: 30 mL via PERINEURAL

## 2015-12-04 MED ORDER — BUPIVACAINE-EPINEPHRINE (PF) 0.5% -1:200000 IJ SOLN
INTRAMUSCULAR | Status: AC
  Filled 2015-12-04: qty 30

## 2015-12-04 MED ORDER — EPHEDRINE SULFATE 50 MG/ML IJ SOLN
INTRAMUSCULAR | Status: DC | PRN
  Administered 2015-12-04: 15 mg via INTRAVENOUS

## 2015-12-04 MED ORDER — LIDOCAINE HCL (CARDIAC) 20 MG/ML IV SOLN
INTRAVENOUS | Status: DC | PRN
  Administered 2015-12-04: 100 mg via INTRAVENOUS

## 2015-12-04 MED ORDER — PROPOFOL 10 MG/ML IV BOLUS
INTRAVENOUS | Status: DC | PRN
  Administered 2015-12-04: 160 mg via INTRAVENOUS
  Administered 2015-12-04: 30 mg via INTRAVENOUS

## 2015-12-04 MED ORDER — DEXAMETHASONE SODIUM PHOSPHATE 10 MG/ML IJ SOLN
INTRAMUSCULAR | Status: DC | PRN
  Administered 2015-12-04: 10 mg via INTRAVENOUS

## 2015-12-04 MED ORDER — CEFAZOLIN SODIUM-DEXTROSE 2-4 GM/100ML-% IV SOLN
2.0000 g | Freq: Once | INTRAVENOUS | Status: AC
  Administered 2015-12-04: 2 g via INTRAVENOUS

## 2015-12-04 MED ORDER — LACTATED RINGERS IV SOLN
INTRAVENOUS | Status: DC
  Administered 2015-12-04 (×3): via INTRAVENOUS

## 2015-12-04 MED ORDER — CEFAZOLIN SODIUM-DEXTROSE 2-4 GM/100ML-% IV SOLN
INTRAVENOUS | Status: AC
  Filled 2015-12-04: qty 100

## 2015-12-04 MED ORDER — FENTANYL CITRATE (PF) 100 MCG/2ML IJ SOLN: 25.0000 ug | INTRAMUSCULAR | Status: DC | PRN

## 2015-12-04 MED ORDER — PHENYLEPHRINE HCL 10 MG/ML IJ SOLN
INTRAMUSCULAR | Status: DC | PRN
  Administered 2015-12-04 (×4): 100 ug via INTRAVENOUS

## 2015-12-04 MED ORDER — FENTANYL CITRATE (PF) 100 MCG/2ML IJ SOLN
INTRAMUSCULAR | Status: DC | PRN
  Administered 2015-12-04 (×4): 50 ug via INTRAVENOUS

## 2015-12-04 MED ORDER — OXYCODONE-ACETAMINOPHEN 5-325 MG PO TABS: 1.0000 | ORAL_TABLET | ORAL | Status: DC | PRN

## 2015-12-04 MED ORDER — ONDANSETRON HCL 4 MG/2ML IJ SOLN: 4.0000 mg | Freq: Once | INTRAMUSCULAR | Status: DC | PRN

## 2015-12-04 MED ORDER — ONDANSETRON HCL 4 MG/2ML IJ SOLN
INTRAMUSCULAR | Status: DC | PRN
  Administered 2015-12-04: 4 mg via INTRAVENOUS

## 2015-12-04 SURGICAL SUPPLY — 26 items
BLADE SURG 15 STRL LF DISP TIS (BLADE) ×1 IMPLANT
BLADE SURG 15 STRL SS (BLADE) ×2
CANISTER SUCT 1200ML W/VALVE (MISCELLANEOUS) ×3 IMPLANT
CHLORAPREP W/TINT 26ML (MISCELLANEOUS) ×3 IMPLANT
DRAIN PENROSE 5/8X18 LTX STRL (WOUND CARE) ×3 IMPLANT
DRAPE LAPAROTOMY 77X122 PED (DRAPES) ×3 IMPLANT
ELECT REM PT RETURN 9FT ADLT (ELECTROSURGICAL) ×3
ELECTRODE REM PT RTRN 9FT ADLT (ELECTROSURGICAL) ×1 IMPLANT
GLOVE BIO SURGEON STRL SZ7.5 (GLOVE) ×9 IMPLANT
GLOVE EXAM NITRILE PF MED BLUE (GLOVE) ×6 IMPLANT
GOWN STRL REUS W/ TWL LRG LVL3 (GOWN DISPOSABLE) ×3 IMPLANT
GOWN STRL REUS W/TWL LRG LVL3 (GOWN DISPOSABLE) ×6
KIT RM TURNOVER STRD PROC AR (KITS) ×3 IMPLANT
LABEL OR SOLS (LABEL) ×3 IMPLANT
LIQUID BAND (GAUZE/BANDAGES/DRESSINGS) ×3 IMPLANT
MESH SYNTHETIC 4X6 SOFT BARD (Mesh General) ×1 IMPLANT
MESH SYNTHETIC SOFT BARD 4X6 (Mesh General) ×2 IMPLANT
NEEDLE HYPO 25X1 1.5 SAFETY (NEEDLE) ×3 IMPLANT
NS IRRIG 500ML POUR BTL (IV SOLUTION) ×3 IMPLANT
PACK BASIN MINOR ARMC (MISCELLANEOUS) ×3 IMPLANT
SUT CHROMIC 4 0 RB 1X27 (SUTURE) ×3 IMPLANT
SUT MNCRL AB 4-0 PS2 18 (SUTURE) ×3 IMPLANT
SUT SURGILON 0 30 BLK (SUTURE) ×3 IMPLANT
SUT VIC AB 4-0 SH 27 (SUTURE) ×2
SUT VIC AB 4-0 SH 27XANBCTRL (SUTURE) ×1 IMPLANT
SYRINGE 10CC LL (SYRINGE) ×3 IMPLANT

## 2015-12-04 NOTE — Anesthesia Preprocedure Evaluation (Signed)
Anesthesia Evaluation  Patient identified by MRN, date of birth, ID band Patient awake    Reviewed: Allergy & Precautions, NPO status , Patient's Chart, lab work & pertinent test results  History of Anesthesia Complications Negative for: history of anesthetic complications  Airway Mallampati: I       Dental   Pulmonary neg pulmonary ROS, former smoker,           Cardiovascular hypertension, Pt. on medications      Neuro/Psych negative neurological ROS     GI/Hepatic Neg liver ROS, GERD  Medicated and Controlled,  Endo/Other  negative endocrine ROS  Renal/GU negative Renal ROS     Musculoskeletal   Abdominal   Peds  Hematology   Anesthesia Other Findings   Reproductive/Obstetrics                             Anesthesia Physical Anesthesia Plan  ASA: II  Anesthesia Plan: General   Post-op Pain Management:    Induction: Intravenous  Airway Management Planned: LMA  Additional Equipment:   Intra-op Plan:   Post-operative Plan:   Informed Consent: I have reviewed the patients History and Physical, chart, labs and discussed the procedure including the risks, benefits and alternatives for the proposed anesthesia with the patient or authorized representative who has indicated his/her understanding and acceptance.     Plan Discussed with:   Anesthesia Plan Comments:         Anesthesia Quick Evaluation

## 2015-12-04 NOTE — Anesthesia Postprocedure Evaluation (Signed)
Anesthesia Post Note  Patient: Edward James  Procedure(s) Performed: Procedure(s) (LRB): HERNIA REPAIR INGUINAL ADULT (Left)  Patient location during evaluation: PACU Anesthesia Type: General Level of consciousness: awake and alert and oriented Pain management: pain level controlled Vital Signs Assessment: post-procedure vital signs reviewed and stable Respiratory status: spontaneous breathing Cardiovascular status: blood pressure returned to baseline Anesthetic complications: no    Last Vitals:  Filed Vitals:   12/04/15 1700 12/04/15 1733  BP: 155/73 163/81  Pulse: 51 61  Temp: 35.6 C   Resp: 16 16    Last Pain:  Filed Vitals:   12/04/15 1734  PainSc: Asleep                 Tennessee Perra

## 2015-12-04 NOTE — Discharge Instructions (Signed)
AMBULATORY SURGERY  DISCHARGE INSTRUCTIONS   1) The drugs that you were given will stay in your system until tomorrow so for the next 24 hours you should not:  A) Drive an automobile B) Make any legal decisions C) Drink any alcoholic beverage   2) You may resume regular meals tomorrow.  Today it is better to start with liquids and gradually work up to solid foods.  You may eat anything you prefer, but it is better to start with liquids, then soup and crackers, and gradually work up to solid foods.   3) Please notify your doctor immediately if you have any unusual bleeding, trouble breathing, redness and pain at the surgery site, drainage, fever, or pain not relieved by medication.    4) Additional Instructions:   Take Tylenol or oxycodone if needed for pain.  May resume aspirin on Thursday.  May shower.  Avoid straining and heavy lifting.       Please contact your physician with any problems or Same Day Surgery at [phone], Monday through Friday 6 am to 4 pm, or [place] at [facility] number at [phone].

## 2015-12-04 NOTE — Anesthesia Procedure Notes (Signed)
Procedure Name: LMA Insertion Date/Time: 12/04/2015 2:10 PM Performed by: Justus Memory Pre-anesthesia Checklist: Patient identified, Emergency Drugs available, Suction available and Patient being monitored Patient Re-evaluated:Patient Re-evaluated prior to inductionOxygen Delivery Method: Circle system utilized Preoxygenation: Pre-oxygenation with 100% oxygen Intubation Type: IV induction LMA: LMA inserted LMA Size: 4.5 Number of attempts: 1 Placement Confirmation: CO2 detector,  breath sounds checked- equal and bilateral and positive ETCO2 Dental Injury: Teeth and Oropharynx as per pre-operative assessment

## 2015-12-04 NOTE — Op Note (Signed)
OPERATIVE REPORT  PREOPERATIVE DIAGNOSIS: left inguinal hernia  POSTOPERATIVE DIAGNOSIS:left  inguinal hernia  PROCEDURE:  left inguinal hernia repair  ANESTHESIA:  General  SURGEON:  Rochel Brome M.D.  INDICATIONS: He has several year history of bulging in the left groin which is gradually increasing in size causing moderate discomfort. A large left inguinal scrotal hernia was demonstrated on physical exam and repair was recommended for definitive treatment.  With the patient on the operating table in the supine position the left lower quadrant was prepared with clippers and with ChloraPrep and draped in a sterile manner. A transversely oriented suprapubic incision was made and carried down through subcutaneous tissues. Electrocautery was used for hemostasis. The Scarpa's fascia was incised. The external oblique aponeurosis was incised along the course of its fibers to open the external ring and expose the inguinal cord structures. The cord structures were mobilized. A Penrose drain was passed around the cord structures for traction. Cremaster fibers were spread to expose a cord lipoma which was dissected free from surrounding structures and ligated with 4-0 Vicryl and amputated. It was also a large indirect hernia sac which the dimension was approximately 15 cm. The sac was dissected free from surrounding structures  followed up into the internal ring. Much of the sac was fatty and was inverted next the repair was carried out suturing the conjoined tendon to the shelving edge of the inguinal ligament incorporating transversalis fascia into the repair the last stitch led to satisfactory narrowing of the internal ring. Bard soft mesh was cut to create an oval shape and was placed over the repair. This was sutured to the repair with interrupted 0 Surgilon sutures and also sutured medially to the deep fascia and on both sides of the internal ring. Next after seeing hemostasis was intact the cord  structures were replaced along the floor of the inguinal canal. The cut edges of the external oblique aponeurosis were closed with a running 4-0 Vicryl suture to re-create the external ring. The deep fascia superior and lateral to the repair site was infiltrated with half percent Sensorcaine with epinephrine. Subcutaneous tissues were also infiltrated. The Scarpa's fascia was closed with interrupted 4-0 Vicryl sutures. The skin was closed with running 4-0 Monocryl subcuticular suture and LiquiBand. The testicle remained in the scrotum  The patient appeared to be in satisfactory condition and was prepared for transfer to the recovery room.  Rochel Brome M.D.

## 2015-12-04 NOTE — H&P (Signed)
  He reports no change in condition since the day of the office exam. He has stopped taking his aspirin.  I discussed the plan for left inguinal hernia repair. The left side was marked YES.

## 2015-12-04 NOTE — Transfer of Care (Signed)
Immediate Anesthesia Transfer of Care Note  Patient: Edward James  Procedure(s) Performed: Procedure(s): HERNIA REPAIR INGUINAL ADULT (Left)  Patient Location: PACU  Anesthesia Type:General  Level of Consciousness: sedated  Airway & Oxygen Therapy: Patient Spontanous Breathing and Patient connected to face mask oxygen  Post-op Assessment: Report given to RN and Post -op Vital signs reviewed and stable  Post vital signs: Reviewed and stable  Last Vitals:  Filed Vitals:   12/04/15 1250  BP: 151/74  Pulse: 63  Temp: 36.7 C  Resp: 16    Complications: No apparent anesthesia complications

## 2015-12-05 ENCOUNTER — Encounter: Payer: Self-pay | Admitting: Surgery

## 2015-12-05 ENCOUNTER — Encounter: Payer: Self-pay | Admitting: Internal Medicine

## 2015-12-14 ENCOUNTER — Ambulatory Visit: Payer: Medicare Other | Admitting: Cardiovascular Disease

## 2016-03-13 ENCOUNTER — Other Ambulatory Visit: Payer: Self-pay | Admitting: Internal Medicine

## 2016-04-03 ENCOUNTER — Other Ambulatory Visit: Payer: Self-pay | Admitting: Internal Medicine

## 2016-05-28 ENCOUNTER — Other Ambulatory Visit: Payer: Self-pay | Admitting: Internal Medicine

## 2016-05-30 ENCOUNTER — Other Ambulatory Visit: Payer: Self-pay | Admitting: Internal Medicine

## 2016-06-06 ENCOUNTER — Encounter: Payer: Self-pay | Admitting: Internal Medicine

## 2016-06-06 ENCOUNTER — Other Ambulatory Visit (INDEPENDENT_AMBULATORY_CARE_PROVIDER_SITE_OTHER): Payer: Medicare Other

## 2016-06-06 DIAGNOSIS — Z8546 Personal history of malignant neoplasm of prostate: Secondary | ICD-10-CM

## 2016-06-06 DIAGNOSIS — E7800 Pure hypercholesterolemia, unspecified: Secondary | ICD-10-CM

## 2016-06-06 DIAGNOSIS — I10 Essential (primary) hypertension: Secondary | ICD-10-CM | POA: Diagnosis not present

## 2016-06-06 LAB — BASIC METABOLIC PANEL
BUN: 19 mg/dL (ref 6–23)
CHLORIDE: 106 meq/L (ref 96–112)
CO2: 29 meq/L (ref 19–32)
Calcium: 9.6 mg/dL (ref 8.4–10.5)
Creatinine, Ser: 0.96 mg/dL (ref 0.40–1.50)
GFR: 81.49 mL/min (ref >60.00)
GLUCOSE: 91 mg/dL (ref 70–99)
POTASSIUM: 4.7 meq/L (ref 3.5–5.1)
SODIUM: 140 meq/L (ref 135–145)

## 2016-06-06 LAB — HEPATIC FUNCTION PANEL
ALT: 24 U/L (ref 0–53)
AST: 21 U/L (ref 0–37)
Albumin: 4.1 g/dL (ref 3.5–5.2)
Alkaline Phosphatase: 56 U/L (ref 39–117)
BILIRUBIN DIRECT: 0.2 mg/dL (ref 0.0–0.3)
BILIRUBIN TOTAL: 0.8 mg/dL (ref 0.2–1.2)
Total Protein: 6.9 g/dL (ref 6.0–8.3)

## 2016-06-06 LAB — PSA, MEDICARE: PSA: 0.51 ng/ml (ref 0.10–4.00)

## 2016-06-06 LAB — LIPID PANEL
CHOL/HDL RATIO: 3
Cholesterol: 143 mg/dL (ref 0–200)
HDL: 47.3 mg/dL (ref >39.00)
LDL CALC: 81 mg/dL (ref 0–99)
NONHDL: 95.38
Triglycerides: 71 mg/dL (ref 0.0–149.0)
VLDL: 14.2 mg/dL (ref 0.0–40.0)

## 2016-06-09 ENCOUNTER — Ambulatory Visit (INDEPENDENT_AMBULATORY_CARE_PROVIDER_SITE_OTHER): Payer: Medicare Other | Admitting: Internal Medicine

## 2016-06-09 ENCOUNTER — Encounter: Payer: Self-pay | Admitting: Internal Medicine

## 2016-06-09 VITALS — BP 118/70 | HR 63 | Temp 98.4°F | Ht 76.0 in | Wt 251.2 lb

## 2016-06-09 DIAGNOSIS — I480 Paroxysmal atrial fibrillation: Secondary | ICD-10-CM

## 2016-06-09 DIAGNOSIS — E7800 Pure hypercholesterolemia, unspecified: Secondary | ICD-10-CM

## 2016-06-09 DIAGNOSIS — I10 Essential (primary) hypertension: Secondary | ICD-10-CM | POA: Diagnosis not present

## 2016-06-09 DIAGNOSIS — K219 Gastro-esophageal reflux disease without esophagitis: Secondary | ICD-10-CM

## 2016-06-09 DIAGNOSIS — Z8546 Personal history of malignant neoplasm of prostate: Secondary | ICD-10-CM

## 2016-06-09 DIAGNOSIS — K4090 Unilateral inguinal hernia, without obstruction or gangrene, not specified as recurrent: Secondary | ICD-10-CM | POA: Diagnosis not present

## 2016-06-09 DIAGNOSIS — M545 Low back pain: Secondary | ICD-10-CM

## 2016-06-09 DIAGNOSIS — Z23 Encounter for immunization: Secondary | ICD-10-CM | POA: Diagnosis not present

## 2016-06-09 NOTE — Assessment & Plan Note (Signed)
Controlled on protonix.  Follow.   

## 2016-06-09 NOTE — Assessment & Plan Note (Signed)
Noticed after surgery.  No problems since.  Saw Dr Fletcher Anon.  Follow.

## 2016-06-09 NOTE — Assessment & Plan Note (Signed)
psa just checked 06/06/16 - .51.  Recheck with next labs.

## 2016-06-09 NOTE — Assessment & Plan Note (Signed)
S/p surgery.  Doing better.  Continue f/u with ortho.

## 2016-06-09 NOTE — Assessment & Plan Note (Signed)
S/p repair and doing well.  Follow.

## 2016-06-09 NOTE — Assessment & Plan Note (Signed)
Blood pressure under good control.  Continue same medication regimen.  Follow pressures.  Follow metabolic panel.   

## 2016-06-09 NOTE — Assessment & Plan Note (Signed)
Low cholesterol diet and exercise.  Follow lipid panel and liver function tests.  On simvastatin.   

## 2016-06-09 NOTE — Progress Notes (Signed)
Patient ID: Jasani Helmly, male   DOB: 06-10-1941, 75 y.o.   MRN: [id number]   Subjective:    Patient ID: Josejuan Stagnaro, male    DOB: [birth year], 75 y.o.   MRN: [id number]  HPI  Patient here for a scheduled follow up.  S/p repair of left inguinal scrotal hernia.  Performed by Dr Tamala Julian.  Doing well.   He is also s/p lumbar decompression, spinal fusion and bone graft 09/28/15.  Doing well s/p surgery.  Still some back discomfort, but overall improved.  Tries to stay active.  No chest pain.  No sob.  No acid reflux.  No abdominal pain or cramping.  Bowels stable.  Overall feels good.     Past Medical History:  Diagnosis Date  . Diverticulosis   . GERD (gastroesophageal reflux disease)   . Hypercholesterolemia   . Hypertension   . Prostate cancer [facility])    s/p radical retropubic prostatectomy 1999  . Spinal stenosis    Past Surgical History:  Procedure Laterality Date  . FOLEY CATHETER    . INGUINAL HERNIA REPAIR  1945  . INGUINAL HERNIA REPAIR Left 12/04/2015   Procedure: HERNIA REPAIR INGUINAL ADULT;  Surgeon: Leonie Green, MD;  Location: [facility] ORS;  Service: General;  Laterality: Left;  . LUMBAR FUSION     L3-4 fusion  . pilonidal cyst removal  1969  . RETROPUBIC PROSTATECTOMY     radical (1999)  . TONSILLECTOMY AND ADENOIDECTOMY  1948   Family History  Problem Relation Age of Onset  . AAA (abdominal aortic aneurysm) Father   . Nephrolithiasis    . Colon cancer Neg Hx    Social History   Social History  . Marital status: Married    Spouse name: N/A  . Number of children: 3  . Years of education: N/A   Social History Main Topics  . Smoking status: Former Smoker    Quit date: 11/28/1973  . Smokeless tobacco: Never Used  . Alcohol use No     Comment: occasional  . Drug use: No  . Sexual activity: Not Asked   Other Topics Concern  . None   Social History Narrative  . None    Outpatient Encounter Prescriptions as of 06/09/2016  Medication Sig  . aspirin 81 MG  tablet Take 81 mg by mouth daily.  Marland Kitchen lisinopril (PRINIVIL,ZESTRIL) 20 MG tablet TAKE 1 TABLET BY MOUTH EVERY DAY.  Marland Kitchen Omega-3 Fatty Acids (OMEGA-3 FISH OIL PO) Take 2 capsules by mouth daily. Takes 1 tablet by mouth twice daily.  . pantoprazole (PROTONIX) 40 MG tablet TAKE ONE (1) TABLET EACH DAY  . simvastatin (ZOCOR) 10 MG tablet TAKE 1 TABLET BY MOUTH EVERY DAY.  Marland Kitchen triamterene-hydrochlorothiazide (MAXZIDE-25) 37.5-25 MG tablet TAKE 1/2 TABLET BY MOUTH ONCE A DAY  . [DISCONTINUED] oxyCODONE-acetaminophen (PERCOCET/ROXICET) 5-325 MG tablet Take 1 tablet by mouth every 6 (six) hours as needed for severe pain.   No facility-administered encounter medications on file as of 06/09/2016.     Review of Systems  Constitutional: Negative for appetite change and unexpected weight change.  HENT: Negative for congestion and sinus pressure.   Respiratory: Negative for cough, chest tightness and shortness of breath.   Cardiovascular: Negative for chest pain, palpitations and leg swelling.  Gastrointestinal: Negative for abdominal pain, diarrhea, nausea and vomiting.  Genitourinary: Negative for difficulty urinating and dysuria.  Musculoskeletal: Positive for back pain. Negative for joint swelling.       Much improved back pain.   Neurological:  Negative for dizziness, light-headedness and headaches.  Psychiatric/Behavioral: Negative for agitation and dysphoric mood.       Objective:     Blood pressure rechecked by me:  122/68  Physical Exam  Constitutional: He appears well-developed and well-nourished. No distress.  HENT:  Nose: Nose normal.  Mouth/Throat: Oropharynx is clear and moist.  Neck: Neck supple. No thyromegaly present.  Cardiovascular: Normal rate and regular rhythm.   Pulmonary/Chest: Effort normal and breath sounds normal. No respiratory distress.  Abdominal: Soft. Bowel sounds are normal. There is no tenderness.  Musculoskeletal: He exhibits no edema or tenderness.    Lymphadenopathy:    He has no cervical adenopathy.  Skin: No rash noted. No erythema.  Psychiatric: He has a normal mood and affect. His behavior is normal.    BP 118/70   Pulse 63   Temp 98.4 F (36.9 C) (Oral)   Ht 6\' 4"  (1.93 m)   Wt 251 lb 3.2 oz (113.9 kg)   SpO2 95%   BMI 30.58 kg/m  Wt Readings from Last 3 Encounters:  06/09/16 251 lb 3.2 oz (113.9 kg)  12/03/15 254 lb 8 oz (115.4 kg)  11/29/15 250 lb (113.4 kg)     Lab Results  Component Value Date   WBC 6.5 11/27/2015   HGB 14.1 11/27/2015   HCT 42.2 11/27/2015   PLT 253.0 11/27/2015   GLUCOSE 91 06/06/2016   CHOL 143 06/06/2016   TRIG 71.0 06/06/2016   HDL 47.30 06/06/2016   LDLCALC 81 06/06/2016   ALT 24 06/06/2016   AST 21 06/06/2016   NA 140 06/06/2016   K 4.7 06/06/2016   CL 106 06/06/2016   CREATININE 0.96 06/06/2016   BUN 19 06/06/2016   CO2 29 06/06/2016   TSH 2.41 11/27/2015   PSA 0.51 06/06/2016       Assessment & Plan:   Problem List Items Addressed This Visit    Back pain    S/p surgery.  Doing better.  Continue f/u with ortho.        GERD (gastroesophageal reflux disease)    Controlled on protonix.  Follow.       History of prostate cancer    psa just checked 06/06/16 - .51.  Recheck with next labs.       Hypercholesterolemia    Low cholesterol diet and exercise.  Follow lipid panel and liver function tests. On simvastatin.       Hypertension    Blood pressure under good control.  Continue same medication regimen.  Follow pressures.  Follow metabolic panel.        Inguinal hernia    S/p repair and doing well.  Follow.       Paroxysmal atrial fibrillation (HCC) - Primary    Noticed after surgery.  No problems since.  Saw Dr Fletcher Anon.  Follow.        Other Visit Diagnoses   None.      Einar Pheasant, MD

## 2016-06-09 NOTE — Progress Notes (Signed)
Pre visit review using our clinic review tool, if applicable. No additional management support is needed unless otherwise documented below in the visit note. 

## 2016-08-28 ENCOUNTER — Other Ambulatory Visit: Payer: Self-pay | Admitting: Internal Medicine

## 2016-09-08 ENCOUNTER — Other Ambulatory Visit: Payer: Self-pay | Admitting: Internal Medicine

## 2016-09-29 ENCOUNTER — Other Ambulatory Visit: Payer: Self-pay | Admitting: Internal Medicine

## 2016-12-04 ENCOUNTER — Encounter: Payer: Self-pay | Admitting: Internal Medicine

## 2016-12-04 ENCOUNTER — Other Ambulatory Visit (INDEPENDENT_AMBULATORY_CARE_PROVIDER_SITE_OTHER): Payer: Medicare Other

## 2016-12-04 DIAGNOSIS — E7800 Pure hypercholesterolemia, unspecified: Secondary | ICD-10-CM | POA: Diagnosis not present

## 2016-12-04 DIAGNOSIS — Z8546 Personal history of malignant neoplasm of prostate: Secondary | ICD-10-CM | POA: Diagnosis not present

## 2016-12-04 DIAGNOSIS — I10 Essential (primary) hypertension: Secondary | ICD-10-CM | POA: Diagnosis not present

## 2016-12-04 LAB — CBC WITH DIFFERENTIAL/PLATELET
BASOS PCT: 0.3 % (ref 0.0–3.0)
Basophils Absolute: 0 10*3/uL (ref 0.0–0.1)
EOS ABS: 0.1 10*3/uL (ref 0.0–0.7)
Eosinophils Relative: 1 % (ref 0.0–5.0)
HCT: 44.6 % (ref 39.0–52.0)
HEMOGLOBIN: 15.2 g/dL (ref 13.0–17.0)
LYMPHS ABS: 2.4 10*3/uL (ref 0.7–4.0)
Lymphocytes Relative: 42.4 % (ref 12.0–46.0)
MCHC: 34.2 g/dL (ref 30.0–36.0)
MCV: 85.3 fl (ref 78.0–100.0)
MONO ABS: 0.6 10*3/uL (ref 0.1–1.0)
Monocytes Relative: 10.4 % (ref 3.0–12.0)
NEUTROS PCT: 45.9 % (ref 43.0–77.0)
Neutro Abs: 2.6 10*3/uL (ref 1.4–7.7)
PLATELETS: 236 10*3/uL (ref 150.0–400.0)
RBC: 5.22 Mil/uL (ref 4.22–5.81)
RDW: 12.6 % (ref 11.5–15.5)
WBC: 5.6 10*3/uL (ref 4.0–10.5)

## 2016-12-04 LAB — HEPATIC FUNCTION PANEL
ALBUMIN: 4.2 g/dL (ref 3.5–5.2)
ALK PHOS: 52 U/L (ref 39–117)
ALT: 24 U/L (ref 0–53)
AST: 23 U/L (ref 0–37)
BILIRUBIN DIRECT: 0.2 mg/dL (ref 0.0–0.3)
TOTAL PROTEIN: 6.9 g/dL (ref 6.0–8.3)
Total Bilirubin: 0.9 mg/dL (ref 0.2–1.2)

## 2016-12-04 LAB — BASIC METABOLIC PANEL
BUN: 16 mg/dL (ref 6–23)
CALCIUM: 9.2 mg/dL (ref 8.4–10.5)
CO2: 30 mEq/L (ref 19–32)
CREATININE: 0.92 mg/dL (ref 0.40–1.50)
Chloride: 105 mEq/L (ref 96–112)
GFR: 85.47 mL/min (ref >60.00)
Glucose, Bld: 98 mg/dL (ref 70–99)
Potassium: 4.4 mEq/L (ref 3.5–5.1)
SODIUM: 139 meq/L (ref 135–145)

## 2016-12-04 LAB — LIPID PANEL
CHOLESTEROL: 127 mg/dL (ref 0–200)
HDL: 36.4 mg/dL — AB (ref >39.00)
LDL Cholesterol: 75 mg/dL (ref 0–99)
NonHDL: 90.12
Total CHOL/HDL Ratio: 3
Triglycerides: 78 mg/dL (ref 0.0–149.0)
VLDL: 15.6 mg/dL (ref 0.0–40.0)

## 2016-12-04 LAB — TSH: TSH: 1.46 u[IU]/mL (ref 0.35–4.50)

## 2016-12-04 LAB — PSA, MEDICARE: PSA: 0.15 ng/ml (ref 0.10–4.00)

## 2016-12-08 ENCOUNTER — Encounter: Payer: Self-pay | Admitting: Internal Medicine

## 2016-12-08 ENCOUNTER — Ambulatory Visit (INDEPENDENT_AMBULATORY_CARE_PROVIDER_SITE_OTHER): Payer: Medicare Other | Admitting: Internal Medicine

## 2016-12-08 VITALS — BP 120/68 | HR 68 | Temp 98.7°F | Resp 14 | Ht 76.0 in | Wt 248.2 lb

## 2016-12-08 DIAGNOSIS — K219 Gastro-esophageal reflux disease without esophagitis: Secondary | ICD-10-CM

## 2016-12-08 DIAGNOSIS — Z8546 Personal history of malignant neoplasm of prostate: Secondary | ICD-10-CM | POA: Diagnosis not present

## 2016-12-08 DIAGNOSIS — Z0000 Encounter for general adult medical examination without abnormal findings: Secondary | ICD-10-CM

## 2016-12-08 DIAGNOSIS — E7800 Pure hypercholesterolemia, unspecified: Secondary | ICD-10-CM | POA: Diagnosis not present

## 2016-12-08 DIAGNOSIS — I480 Paroxysmal atrial fibrillation: Secondary | ICD-10-CM | POA: Diagnosis not present

## 2016-12-08 DIAGNOSIS — I10 Essential (primary) hypertension: Secondary | ICD-10-CM | POA: Diagnosis not present

## 2016-12-08 NOTE — Progress Notes (Signed)
Pre-visit discussion using our clinic review tool. No additional management support is needed unless otherwise documented below in the visit note.  

## 2016-12-08 NOTE — Progress Notes (Signed)
Patient ID: Edward James, male   DOB: [birth year], 77 y.o.   MRN: [id number]   Subjective:    Patient ID: Edward James, male    DOB: 1941/06/14, 76 y.o.   MRN: [id number]  HPI  Patient here for his physical exam.  Had recent cough, congestion and fever.  Took otc meds.  Better now.  No fever.  Minimal drainage with minimal cough, but much improved.  Eating.  No nausea or vomiting.  No sob.  No chest pain.  Bowels moving.  No diarrhea.  Overall feels good.  Discussed labs.     Past Medical History:  Diagnosis Date  . Diverticulosis   . GERD (gastroesophageal reflux disease)   . Hypercholesterolemia   . Hypertension   . Prostate cancer [facility])    s/p radical retropubic prostatectomy 1999  . Spinal stenosis    Past Surgical History:  Procedure Laterality Date  . FOLEY CATHETER    . INGUINAL HERNIA REPAIR  1945  . INGUINAL HERNIA REPAIR Left 12/04/2015   Procedure: HERNIA REPAIR INGUINAL ADULT;  Surgeon: Leonie Green, MD;  Location: [facility] ORS;  Service: General;  Laterality: Left;  . LUMBAR FUSION     L3-4 fusion  . pilonidal cyst removal  1969  . RETROPUBIC PROSTATECTOMY     radical (1999)  . TONSILLECTOMY AND ADENOIDECTOMY  1948   Family History  Problem Relation Age of Onset  . AAA (abdominal aortic aneurysm) Father   . Nephrolithiasis    . Colon cancer Neg Hx    Social History   Social History  . Marital status: Married    Spouse name: N/A  . Number of children: 3  . Years of education: N/A   Social History Main Topics  . Smoking status: Former Smoker    Quit date: 11/28/1973  . Smokeless tobacco: Never Used  . Alcohol use No     Comment: occasional  . Drug use: No  . Sexual activity: Not Asked   Other Topics Concern  . None   Social History Narrative  . None    Outpatient Encounter Prescriptions as of 12/08/2016  Medication Sig  . aspirin 81 MG tablet Take 81 mg by mouth daily.  Marland Kitchen lisinopril (PRINIVIL,ZESTRIL) 20 MG tablet TAKE 1 TABLET BY MOUTH EVERY  DAY.  Marland Kitchen Omega-3 Fatty Acids (OMEGA-3 FISH OIL PO) Take 2 capsules by mouth daily. Takes 1 tablet by mouth twice daily.  . pantoprazole (PROTONIX) 40 MG tablet TAKE 1 TABLET BY MOUTH EVERY DAY.  . simvastatin (ZOCOR) 10 MG tablet TAKE ONE TABLET BY MOUTH DAILY  . triamcinolone (NASACORT) 55 MCG/ACT AERO nasal inhaler Place into the nose.  . triamterene-hydrochlorothiazide (MAXZIDE-25) 37.5-25 MG tablet TAKE ONE-HALF TABLET BY MOUTH ONCE A DAY   No facility-administered encounter medications on file as of 12/08/2016.     Review of Systems  Constitutional: Negative for appetite change and unexpected weight change.  HENT: Positive for postnasal drip. Negative for congestion, sinus pressure and sore throat.   Eyes: Negative for pain and visual disturbance.  Respiratory: Positive for cough. Negative for chest tightness and shortness of breath.   Cardiovascular: Negative for chest pain, palpitations and leg swelling.  Gastrointestinal: Negative for abdominal pain, diarrhea, nausea and vomiting.  Genitourinary: Negative for difficulty urinating and dysuria.  Musculoskeletal: Negative for back pain and joint swelling.       Back is doing well s/p surgery.   Skin: Negative for color change and rash.  Neurological: Negative for dizziness, light-headedness  and headaches.  Hematological: Negative for adenopathy. Does not bruise/bleed easily.  Psychiatric/Behavioral: Negative for agitation and dysphoric mood.       Objective:    Physical Exam  Constitutional: He is oriented to person, place, and time. He appears well-developed and well-nourished. No distress.  HENT:  Head: Normocephalic and atraumatic.  Nose: Nose normal.  Mouth/Throat: Oropharynx is clear and moist. No oropharyngeal exudate.  Eyes: Conjunctivae are normal. Right eye exhibits no discharge. Left eye exhibits no discharge.  Neck: Neck supple. No thyromegaly present.  Cardiovascular: Normal rate and regular rhythm.     Pulmonary/Chest: Breath sounds normal. No respiratory distress. He has no wheezes.  Abdominal: Soft. Bowel sounds are normal. There is no tenderness.  Genitourinary:  Genitourinary Comments: Rectal exam:  Heme negative.    Musculoskeletal: He exhibits no edema or tenderness.  Lymphadenopathy:    He has no cervical adenopathy.  Neurological: He is alert and oriented to person, place, and time.  Skin: Skin is warm and dry. No rash noted.  Psychiatric: He has a normal mood and affect. His behavior is normal.    BP 120/68 (BP Location: Left Arm, Patient Position: Sitting, Cuff Size: Large)   Pulse 68   Temp 98.7 F (37.1 C) (Oral)   Resp 14   Ht 6\' 4"  (1.93 m)   Wt 248 lb 3.2 oz (112.6 kg)   SpO2 96%   BMI 30.21 kg/m  Wt Readings from Last 3 Encounters:  12/08/16 248 lb 3.2 oz (112.6 kg)  06/09/16 251 lb 3.2 oz (113.9 kg)  12/03/15 254 lb 8 oz (115.4 kg)     Lab Results  Component Value Date   WBC 5.6 12/04/2016   HGB 15.2 12/04/2016   HCT 44.6 12/04/2016   PLT 236.0 12/04/2016   GLUCOSE 98 12/04/2016   CHOL 127 12/04/2016   TRIG 78.0 12/04/2016   HDL 36.40 (L) 12/04/2016   LDLCALC 75 12/04/2016   ALT 24 12/04/2016   AST 23 12/04/2016   NA 139 12/04/2016   K 4.4 12/04/2016   CL 105 12/04/2016   CREATININE 0.92 12/04/2016   BUN 16 12/04/2016   CO2 30 12/04/2016   TSH 1.46 12/04/2016   PSA 0.15 12/04/2016       Assessment & Plan:   Problem List Items Addressed This Visit    GERD (gastroesophageal reflux disease)    Controlled on protonix.        Health care maintenance    Physical today 12/08/16.  PSA 12/04/16 - .15.  Colonoscopy 04/11/14.        History of prostate cancer    psa just checked .15.  Follow.        Relevant Orders   PSA, Medicare   Hypercholesterolemia    On simvastatin.  Low cholesterol diet and exercise.  Follow lipid panel and liver function tests.        Relevant Orders   Hepatic function panel   Lipid panel   Hypertension     Blood pressure under good control.  Continue same medication regimen.  Follow pressures.  Follow metabolic panel.        Relevant Orders   Basic metabolic panel   Paroxysmal atrial fibrillation (HCC)    Saw Dr Fletcher Anon.  Occurred post op. In SR now.  Follow.        Other Visit Diagnoses    Routine general medical examination at a health care facility    -  Primary  Einar Pheasant, MD

## 2016-12-08 NOTE — Assessment & Plan Note (Signed)
Physical today 12/08/16.  PSA 12/04/16 - .15.  Colonoscopy 04/11/14.

## 2016-12-17 ENCOUNTER — Telehealth: Payer: Self-pay | Admitting: Internal Medicine

## 2016-12-17 NOTE — Telephone Encounter (Signed)
Pt declined AWV. °

## 2016-12-21 ENCOUNTER — Encounter: Payer: Self-pay | Admitting: Internal Medicine

## 2016-12-21 NOTE — Assessment & Plan Note (Signed)
On simvastatin.  Low cholesterol diet and exercise.  Follow lipid panel and liver function tests.   

## 2016-12-21 NOTE — Assessment & Plan Note (Signed)
Saw Dr Fletcher Anon.  Occurred post op. In SR now.  Follow.

## 2016-12-21 NOTE — Assessment & Plan Note (Signed)
Controlled on protonix.   

## 2016-12-21 NOTE — Assessment & Plan Note (Signed)
Blood pressure under good control.  Continue same medication regimen.  Follow pressures.  Follow metabolic panel.   

## 2016-12-21 NOTE — Assessment & Plan Note (Signed)
psa just checked .15.  Follow.

## 2017-01-03 ENCOUNTER — Other Ambulatory Visit: Payer: Self-pay | Admitting: Internal Medicine

## 2017-03-06 ENCOUNTER — Other Ambulatory Visit: Payer: Self-pay | Admitting: Internal Medicine

## 2017-05-22 ENCOUNTER — Other Ambulatory Visit: Payer: Self-pay | Admitting: Internal Medicine

## 2017-06-09 ENCOUNTER — Other Ambulatory Visit (INDEPENDENT_AMBULATORY_CARE_PROVIDER_SITE_OTHER): Payer: Medicare Other

## 2017-06-09 DIAGNOSIS — I10 Essential (primary) hypertension: Secondary | ICD-10-CM

## 2017-06-09 DIAGNOSIS — Z8546 Personal history of malignant neoplasm of prostate: Secondary | ICD-10-CM

## 2017-06-09 DIAGNOSIS — E7800 Pure hypercholesterolemia, unspecified: Secondary | ICD-10-CM

## 2017-06-09 LAB — HEPATIC FUNCTION PANEL
ALK PHOS: 52 U/L (ref 39–117)
ALT: 23 U/L (ref 0–53)
AST: 19 U/L (ref 0–37)
Albumin: 4.2 g/dL (ref 3.5–5.2)
Bilirubin, Direct: 0.2 mg/dL (ref 0.0–0.3)
Total Bilirubin: 0.7 mg/dL (ref 0.2–1.2)
Total Protein: 6.6 g/dL (ref 6.0–8.3)

## 2017-06-09 LAB — LIPID PANEL
CHOL/HDL RATIO: 3
Cholesterol: 131 mg/dL (ref 0–200)
HDL: 45 mg/dL (ref >39.00)
LDL Cholesterol: 75 mg/dL (ref 0–99)
NonHDL: 86.18
Triglycerides: 55 mg/dL (ref 0.0–149.0)
VLDL: 11 mg/dL (ref 0.0–40.0)

## 2017-06-09 LAB — BASIC METABOLIC PANEL
BUN: 25 mg/dL — ABNORMAL HIGH (ref 6–23)
CO2: 29 mEq/L (ref 19–32)
Calcium: 9.4 mg/dL (ref 8.4–10.5)
Chloride: 106 mEq/L (ref 96–112)
Creatinine, Ser: 0.89 mg/dL (ref 0.40–1.50)
GFR: 88.68 mL/min (ref >60.00)
Glucose, Bld: 101 mg/dL — ABNORMAL HIGH (ref 70–99)
POTASSIUM: 4.5 meq/L (ref 3.5–5.1)
SODIUM: 140 meq/L (ref 135–145)

## 2017-06-09 LAB — PSA, MEDICARE: PSA: 0.25 ng/mL (ref 0.10–4.00)

## 2017-06-10 ENCOUNTER — Encounter: Payer: Self-pay | Admitting: Internal Medicine

## 2017-06-10 ENCOUNTER — Ambulatory Visit (INDEPENDENT_AMBULATORY_CARE_PROVIDER_SITE_OTHER): Payer: Medicare Other | Admitting: Internal Medicine

## 2017-06-10 VITALS — BP 138/72 | HR 59 | Temp 98.1°F | Resp 14 | Ht 76.0 in | Wt 245.8 lb

## 2017-06-10 DIAGNOSIS — Z23 Encounter for immunization: Secondary | ICD-10-CM

## 2017-06-10 DIAGNOSIS — I10 Essential (primary) hypertension: Secondary | ICD-10-CM | POA: Diagnosis not present

## 2017-06-10 DIAGNOSIS — Z8546 Personal history of malignant neoplasm of prostate: Secondary | ICD-10-CM

## 2017-06-10 DIAGNOSIS — I480 Paroxysmal atrial fibrillation: Secondary | ICD-10-CM

## 2017-06-10 DIAGNOSIS — K219 Gastro-esophageal reflux disease without esophagitis: Secondary | ICD-10-CM | POA: Diagnosis not present

## 2017-06-10 DIAGNOSIS — E7800 Pure hypercholesterolemia, unspecified: Secondary | ICD-10-CM | POA: Diagnosis not present

## 2017-06-10 MED ORDER — PANTOPRAZOLE SODIUM 40 MG PO TBEC: 40.0000 mg | DELAYED_RELEASE_TABLET | Freq: Every day | ORAL | 1 refills | Status: DC

## 2017-06-10 MED ORDER — SIMVASTATIN 10 MG PO TABS: 10.0000 mg | ORAL_TABLET | Freq: Every day | ORAL | 1 refills | Status: DC

## 2017-06-10 NOTE — Progress Notes (Signed)
Patient ID: Edward James, male   DOB: [birth year], 76 y.o.   MRN: [id number]   Subjective:    Patient ID: Edward James, male    DOB: 12-18-1940, 76 y.o.   MRN: [id number]  HPI  Patient here for a scheduled follow up.  States he is doing relatively well.  States some stress with not being able to do all the things he used to do.  Is still staying active.  No chest pain.  No sob.  No acid reflux.  No abdominal pain.  Bowels stable.  Discussed labs.  Overall he feels things are relatively stable.     Past Medical History:  Diagnosis Date  . Diverticulosis   . GERD (gastroesophageal reflux disease)   . Hypercholesterolemia   . Hypertension   . Prostate cancer [facility])    s/p radical retropubic prostatectomy 1999  . Spinal stenosis    Past Surgical History:  Procedure Laterality Date  . FOLEY CATHETER    . INGUINAL HERNIA REPAIR  1945  . INGUINAL HERNIA REPAIR Left 12/04/2015   Procedure: HERNIA REPAIR INGUINAL ADULT;  Surgeon: Leonie Green, MD;  Location: [facility] ORS;  Service: General;  Laterality: Left;  . LUMBAR FUSION     L3-4 fusion  . pilonidal cyst removal  1969  . RETROPUBIC PROSTATECTOMY     radical (1999)  . TONSILLECTOMY AND ADENOIDECTOMY  1948   Family History  Problem Relation Age of Onset  . AAA (abdominal aortic aneurysm) Father   . Nephrolithiasis Unknown   . Colon cancer Neg Hx    Social History   Social History  . Marital status: Married    Spouse name: N/A  . Number of children: 3  . Years of education: N/A   Social History Main Topics  . Smoking status: Former Smoker    Quit date: 11/28/1973  . Smokeless tobacco: Never Used  . Alcohol use No     Comment: occasional  . Drug use: No  . Sexual activity: Not Asked   Other Topics Concern  . None   Social History Narrative  . None    Outpatient Encounter Prescriptions as of 06/10/2017  Medication Sig  . aspirin 81 MG tablet Take 81 mg by mouth daily.  Marland Kitchen lisinopril (PRINIVIL,ZESTRIL) 20 MG  tablet TAKE 1 TABLET BY MOUTH EVERY DAY.  Marland Kitchen Omega-3 Fatty Acids (OMEGA-3 FISH OIL PO) Take 2 capsules by mouth daily. Takes 1 tablet by mouth twice daily.  . pantoprazole (PROTONIX) 40 MG tablet Take 1 tablet (40 mg total) by mouth daily.  . simvastatin (ZOCOR) 10 MG tablet Take 1 tablet (10 mg total) by mouth daily.  Marland Kitchen triamcinolone (NASACORT) 55 MCG/ACT AERO nasal inhaler Place into the nose.  . triamterene-hydrochlorothiazide (MAXZIDE-25) 37.5-25 MG tablet TAKE ONE-HALF TABLET BY MOUTH ONCE A DAY  . [DISCONTINUED] pantoprazole (PROTONIX) 40 MG tablet TAKE 1 TABLET BY MOUTH EVERY DAY.  . [DISCONTINUED] simvastatin (ZOCOR) 10 MG tablet TAKE 1 TABLET BY MOUTH EVERY DAY.   No facility-administered encounter medications on file as of 06/10/2017.     Review of Systems  Constitutional: Negative for appetite change and unexpected weight change.  HENT: Negative for congestion and sinus pressure.   Respiratory: Negative for cough, chest tightness and shortness of breath.   Cardiovascular: Negative for chest pain, palpitations and leg swelling.  Gastrointestinal: Negative for abdominal pain, diarrhea, nausea and vomiting.  Genitourinary: Negative for difficulty urinating and dysuria.  Musculoskeletal: Negative for joint swelling and myalgias.  Skin: Negative  for color change and rash.  Neurological: Negative for dizziness, light-headedness and headaches.  Psychiatric/Behavioral: Negative for agitation and dysphoric mood.       Objective:     Blood pressure rechecked by me:  136/82  Physical Exam  Constitutional: He appears well-developed and well-nourished. No distress.  HENT:  Nose: Nose normal.  Mouth/Throat: Oropharynx is clear and moist.  Neck: Neck supple. No thyromegaly present.  Cardiovascular: Normal rate and regular rhythm.   Pulmonary/Chest: Effort normal and breath sounds normal. No respiratory distress.  Abdominal: Soft. Bowel sounds are normal. There is no tenderness.    Musculoskeletal: He exhibits no edema or tenderness.  Lymphadenopathy:    He has no cervical adenopathy.  Skin: No rash noted. No erythema.  Psychiatric: He has a normal mood and affect. His behavior is normal.    BP 138/72 (BP Location: Left Arm, Patient Position: Sitting, Cuff Size: Normal)   Pulse (!) 59   Temp 98.1 F (36.7 C) (Oral)   Resp 14   Ht 6\' 4"  (1.93 m)   Wt 245 lb 12.8 oz (111.5 kg)   SpO2 98%   BMI 29.92 kg/m  Wt Readings from Last 3 Encounters:  06/10/17 245 lb 12.8 oz (111.5 kg)  12/08/16 248 lb 3.2 oz (112.6 kg)  06/09/16 251 lb 3.2 oz (113.9 kg)     Lab Results  Component Value Date   WBC 5.6 12/04/2016   HGB 15.2 12/04/2016   HCT 44.6 12/04/2016   PLT 236.0 12/04/2016   GLUCOSE 101 (H) 06/09/2017   CHOL 131 06/09/2017   TRIG 55.0 06/09/2017   HDL 45.00 06/09/2017   LDLCALC 75 06/09/2017   ALT 23 06/09/2017   AST 19 06/09/2017   NA 140 06/09/2017   K 4.5 06/09/2017   CL 106 06/09/2017   CREATININE 0.89 06/09/2017   BUN 25 (H) 06/09/2017   CO2 29 06/09/2017   TSH 1.46 12/04/2016   PSA 0.25 06/09/2017       Assessment & Plan:   Problem List Items Addressed This Visit    GERD (gastroesophageal reflux disease)    Controlled on protonix.  Follow.       Relevant Medications   pantoprazole (PROTONIX) 40 MG tablet   History of prostate cancer    Recent psa stable.  Follow.       Hypercholesterolemia    On simvastatin.  Low cholesterol diet and exercise.  Follow lipid panel and liver function tests.        Relevant Medications   simvastatin (ZOCOR) 10 MG tablet   Hypertension    Blood pressure under good control.  Continue same medication regimen.  Follow pressures.  Follow metabolic panel.        Relevant Medications   simvastatin (ZOCOR) 10 MG tablet   Paroxysmal atrial fibrillation (HCC)    Saw Dr Fletcher Anon.  Occurred post op.  In SR now.  Currently doing well.  Follow.       Relevant Medications   simvastatin (ZOCOR) 10 MG  tablet    Other Visit Diagnoses    Need for pneumococcal vaccination    -  Primary   Encounter for immunization       Relevant Orders   Flu vaccine HIGH DOSE PF (Completed)       Einar Pheasant, MD

## 2017-06-12 ENCOUNTER — Ambulatory Visit: Payer: Medicare Other | Admitting: Internal Medicine

## 2017-06-13 ENCOUNTER — Encounter: Payer: Self-pay | Admitting: Internal Medicine

## 2017-06-13 NOTE — Assessment & Plan Note (Signed)
On simvastatin.  Low cholesterol diet and exercise.  Follow lipid panel and liver function tests.   

## 2017-06-13 NOTE — Assessment & Plan Note (Signed)
Blood pressure under good control.  Continue same medication regimen.  Follow pressures.  Follow metabolic panel.   

## 2017-06-13 NOTE — Assessment & Plan Note (Signed)
Recent psa stable.  Follow.

## 2017-06-13 NOTE — Assessment & Plan Note (Signed)
Controlled on protonix.  Follow.   

## 2017-06-13 NOTE — Assessment & Plan Note (Signed)
Saw Dr Fletcher Anon.  Occurred post op.  In SR now.  Currently doing well.  Follow.

## 2017-11-16 ENCOUNTER — Other Ambulatory Visit: Payer: Self-pay | Admitting: Internal Medicine

## 2017-11-17 ENCOUNTER — Other Ambulatory Visit: Payer: Self-pay | Admitting: Internal Medicine

## 2017-12-07 ENCOUNTER — Other Ambulatory Visit (INDEPENDENT_AMBULATORY_CARE_PROVIDER_SITE_OTHER): Payer: Medicare Other

## 2017-12-07 DIAGNOSIS — E7800 Pure hypercholesterolemia, unspecified: Secondary | ICD-10-CM

## 2017-12-07 DIAGNOSIS — Z8546 Personal history of malignant neoplasm of prostate: Secondary | ICD-10-CM

## 2017-12-07 DIAGNOSIS — I10 Essential (primary) hypertension: Secondary | ICD-10-CM

## 2017-12-07 LAB — BASIC METABOLIC PANEL
BUN: 19 mg/dL (ref 6–23)
CO2: 26 meq/L (ref 19–32)
CREATININE: 0.89 mg/dL (ref 0.40–1.50)
Calcium: 9.1 mg/dL (ref 8.4–10.5)
Chloride: 105 mEq/L (ref 96–112)
GFR: 88.56 mL/min (ref >60.00)
GLUCOSE: 88 mg/dL (ref 70–99)
Potassium: 4.1 mEq/L (ref 3.5–5.1)
SODIUM: 139 meq/L (ref 135–145)

## 2017-12-07 LAB — LIPID PANEL
CHOL/HDL RATIO: 3
Cholesterol: 128 mg/dL (ref 0–200)
HDL: 41.5 mg/dL (ref >39.00)
LDL CALC: 72 mg/dL (ref 0–99)
NONHDL: 86.76
TRIGLYCERIDES: 73 mg/dL (ref 0.0–149.0)
VLDL: 14.6 mg/dL (ref 0.0–40.0)

## 2017-12-07 LAB — CBC WITH DIFFERENTIAL/PLATELET
BASOS ABS: 0 10*3/uL (ref 0.0–0.1)
Basophils Relative: 0.4 % (ref 0.0–3.0)
EOS PCT: 1.6 % (ref 0.0–5.0)
Eosinophils Absolute: 0.1 10*3/uL (ref 0.0–0.7)
HEMATOCRIT: 42.6 % (ref 39.0–52.0)
Hemoglobin: 14.6 g/dL (ref 13.0–17.0)
LYMPHS PCT: 33 % (ref 12.0–46.0)
Lymphs Abs: 2.5 10*3/uL (ref 0.7–4.0)
MCHC: 34.2 g/dL (ref 30.0–36.0)
MCV: 86.5 fl (ref 78.0–100.0)
MONOS PCT: 10.9 % (ref 3.0–12.0)
Monocytes Absolute: 0.8 10*3/uL (ref 0.1–1.0)
NEUTROS ABS: 4 10*3/uL (ref 1.4–7.7)
Neutrophils Relative %: 54.1 % (ref 43.0–77.0)
PLATELETS: 214 10*3/uL (ref 150.0–400.0)
RBC: 4.92 Mil/uL (ref 4.22–5.81)
RDW: 13.1 % (ref 11.5–15.5)
WBC: 7.4 10*3/uL (ref 4.0–10.5)

## 2017-12-07 LAB — TSH: TSH: 2.6 u[IU]/mL (ref 0.35–4.50)

## 2017-12-07 LAB — HEPATIC FUNCTION PANEL
ALT: 22 U/L (ref 0–53)
AST: 22 U/L (ref 0–37)
Albumin: 4 g/dL (ref 3.5–5.2)
Alkaline Phosphatase: 56 U/L (ref 39–117)
Bilirubin, Direct: 0.2 mg/dL (ref 0.0–0.3)
TOTAL PROTEIN: 6.6 g/dL (ref 6.0–8.3)
Total Bilirubin: 0.8 mg/dL (ref 0.2–1.2)

## 2017-12-07 LAB — PSA, MEDICARE: PSA: 0.09 ng/ml — ABNORMAL LOW (ref 0.10–4.00)

## 2017-12-08 ENCOUNTER — Encounter: Payer: Self-pay | Admitting: Internal Medicine

## 2017-12-09 ENCOUNTER — Encounter: Payer: Self-pay | Admitting: Internal Medicine

## 2017-12-09 ENCOUNTER — Ambulatory Visit (INDEPENDENT_AMBULATORY_CARE_PROVIDER_SITE_OTHER): Payer: Medicare Other | Admitting: Internal Medicine

## 2017-12-09 DIAGNOSIS — Z0000 Encounter for general adult medical examination without abnormal findings: Secondary | ICD-10-CM | POA: Diagnosis not present

## 2017-12-09 DIAGNOSIS — I480 Paroxysmal atrial fibrillation: Secondary | ICD-10-CM | POA: Diagnosis not present

## 2017-12-09 DIAGNOSIS — Z8546 Personal history of malignant neoplasm of prostate: Secondary | ICD-10-CM | POA: Diagnosis not present

## 2017-12-09 DIAGNOSIS — K219 Gastro-esophageal reflux disease without esophagitis: Secondary | ICD-10-CM

## 2017-12-09 DIAGNOSIS — E7800 Pure hypercholesterolemia, unspecified: Secondary | ICD-10-CM

## 2017-12-09 DIAGNOSIS — I10 Essential (primary) hypertension: Secondary | ICD-10-CM

## 2017-12-09 NOTE — Assessment & Plan Note (Signed)
Physical today 12/09/17.  PSA just checked .09.  Colonoscopy 04/11/14.

## 2017-12-09 NOTE — Progress Notes (Signed)
Patient ID: Edward James, male   DOB: Jan 26, 1941, 77 y.o.   MRN: [id number]   Subjective:    Patient ID: Edward James, male    DOB: Jan 11, 1941, 77 y.o.   MRN: [id number]  HPI  Patient here for his complete physical exam.  He reports he is doing well.  Trying to stay active.  No chest pain.  No sob.  No acid reflux.  No abdominal pain.  Bowels moving.  No urine change.  Discussed recent labs.  Discussed decreased psa.  Previously followed by urology.  States was told cut off for psa was 1.0.  Overall he feels he is doing well.     Past Medical History:  Diagnosis Date  . Diverticulosis   . GERD (gastroesophageal reflux disease)   . Hypercholesterolemia   . Hypertension   . Prostate cancer [facility])    s/p radical retropubic prostatectomy 1999  . Spinal stenosis    Past Surgical History:  Procedure Laterality Date  . FOLEY CATHETER    . INGUINAL HERNIA REPAIR  1945  . INGUINAL HERNIA REPAIR Left 12/04/2015   Procedure: HERNIA REPAIR INGUINAL ADULT;  Surgeon: Leonie Green, MD;  Location: [facility] ORS;  Service: General;  Laterality: Left;  . LUMBAR FUSION     L3-4 fusion  . pilonidal cyst removal  1969  . RETROPUBIC PROSTATECTOMY     radical (1999)  . TONSILLECTOMY AND ADENOIDECTOMY  1948   Family History  Problem Relation Age of Onset  . AAA (abdominal aortic aneurysm) Father   . Nephrolithiasis Unknown   . Colon cancer Neg Hx    Social History   Socioeconomic History  . Marital status: Married    Spouse name: Not on file  . Number of children: 3  . Years of education: Not on file  . Highest education level: Not on file  Occupational History  . Not on file  Social Needs  . Financial resource strain: Not on file  . Food insecurity:    Worry: Not on file    Inability: Not on file  . Transportation needs:    Medical: Not on file    Non-medical: Not on file  Tobacco Use  . Smoking status: Former Smoker    Last attempt to quit: 11/28/1973    Years since quitting: 44.0   . Smokeless tobacco: Never Used  Substance and Sexual Activity  . Alcohol use: No    Alcohol/week: 0.0 oz    Comment: occasional  . Drug use: No  . Sexual activity: Not on file  Lifestyle  . Physical activity:    Days per week: Not on file    Minutes per session: Not on file  . Stress: Not on file  Relationships  . Social connections:    Talks on phone: Not on file    Gets together: Not on file    Attends religious service: Not on file    Active member of club or organization: Not on file    Attends meetings of clubs or organizations: Not on file    Relationship status: Not on file  Other Topics Concern  . Not on file  Social History Narrative  . Not on file    Outpatient Encounter Medications as of 12/09/2017  Medication Sig  . aspirin 81 MG tablet Take 81 mg by mouth daily.  Marland Kitchen lisinopril (PRINIVIL,ZESTRIL) 20 MG tablet TAKE 1 TABLET BY MOUTH EVERY DAY  . Omega-3 Fatty Acids (OMEGA-3 FISH OIL PO) Take 2 capsules by  mouth daily. Takes 1 tablet by mouth twice daily.  . pantoprazole (PROTONIX) 40 MG tablet Take 1 tablet (40 mg total) by mouth daily.  . simvastatin (ZOCOR) 10 MG tablet TAKE 1 TABLET BY MOUTH DAILY  . triamcinolone (NASACORT) 55 MCG/ACT AERO nasal inhaler Place into the nose.  . triamterene-hydrochlorothiazide (MAXZIDE-25) 37.5-25 MG tablet TAKE ONE-HALF TABLET BY MOUTH ONCE A DAY   No facility-administered encounter medications on file as of 12/09/2017.     Review of Systems  Constitutional: Negative for appetite change and unexpected weight change.  HENT: Negative for congestion and sinus pressure.   Eyes: Negative for pain and visual disturbance.  Respiratory: Negative for cough, chest tightness and shortness of breath.   Cardiovascular: Negative for chest pain, palpitations and leg swelling.  Gastrointestinal: Negative for abdominal pain, diarrhea, nausea and vomiting.  Genitourinary: Negative for difficulty urinating and dysuria.  Musculoskeletal:  Negative for joint swelling and myalgias.  Skin: Negative for color change and rash.  Neurological: Negative for dizziness, light-headedness and headaches.  Hematological: Negative for adenopathy. Does not bruise/bleed easily.  Psychiatric/Behavioral: Negative for agitation and dysphoric mood.       Objective:    Physical Exam  Constitutional: He is oriented to person, place, and time. He appears well-developed and well-nourished. No distress.  HENT:  Head: Normocephalic and atraumatic.  Nose: Nose normal.  Mouth/Throat: Oropharynx is clear and moist. No oropharyngeal exudate.  Eyes: Conjunctivae are normal. Right eye exhibits no discharge. Left eye exhibits no discharge.  Neck: Neck supple. No thyromegaly present.  Cardiovascular: Normal rate and regular rhythm.  Pulmonary/Chest: Breath sounds normal. No respiratory distress. He has no wheezes.  Abdominal: Soft. Bowel sounds are normal. There is no tenderness.  Genitourinary:  Genitourinary Comments: Not performed.    Musculoskeletal: He exhibits no edema or tenderness.  Lymphadenopathy:    He has no cervical adenopathy.  Neurological: He is alert and oriented to person, place, and time.  Skin: Skin is warm and dry. No rash noted. No erythema.  Psychiatric: He has a normal mood and affect. His behavior is normal.    BP 136/78 (BP Location: Left Arm, Patient Position: Sitting, Cuff Size: Large)   Pulse (!) 57   Temp 97.7 F (36.5 C) (Oral)   Resp 16   Ht 6\' 4"  (1.93 m)   Wt 251 lb 6.4 oz (114 kg)   SpO2 98%   BMI 30.60 kg/m  Wt Readings from Last 3 Encounters:  12/09/17 251 lb 6.4 oz (114 kg)  06/10/17 245 lb 12.8 oz (111.5 kg)  12/08/16 248 lb 3.2 oz (112.6 kg)     Lab Results  Component Value Date   WBC 7.4 12/07/2017   HGB 14.6 12/07/2017   HCT 42.6 12/07/2017   PLT 214.0 12/07/2017   GLUCOSE 88 12/07/2017   CHOL 128 12/07/2017   TRIG 73.0 12/07/2017   HDL 41.50 12/07/2017   LDLCALC 72 12/07/2017   ALT 22  12/07/2017   AST 22 12/07/2017   NA 139 12/07/2017   K 4.1 12/07/2017   CL 105 12/07/2017   CREATININE 0.89 12/07/2017   BUN 19 12/07/2017   CO2 26 12/07/2017   TSH 2.60 12/07/2017   PSA 0.09 (L) 12/07/2017       Assessment & Plan:   Problem List Items Addressed This Visit    GERD (gastroesophageal reflux disease)    Controlled on protonix.        Health care maintenance    Physical today 12/09/17.  PSA just checked .09.  Colonoscopy 04/11/14.        History of prostate cancer    Recent psa .09 (decresed).  Follow.        Hypercholesterolemia    On simvastatin.  Low cholesterol diet and exercise.  Follow lipid panel and liver function tests.        Relevant Orders   Hepatic function panel   Lipid panel   Hypertension    Blood pressure under good control.  Continue same medication regimen.  Follow pressures.  Follow metabolic panel.        Relevant Orders   Basic metabolic panel   Paroxysmal atrial fibrillation ([place])    Previous occurred post op.  Saw Dr Fletcher Anon.  In SR.  Currently doing well.  Follow.            Einar Pheasant, MD

## 2017-12-12 ENCOUNTER — Encounter: Payer: Self-pay | Admitting: Internal Medicine

## 2017-12-12 NOTE — Assessment & Plan Note (Signed)
Recent psa .09 (decresed).  Follow.

## 2017-12-12 NOTE — Assessment & Plan Note (Signed)
Previous occurred post op.  Saw Dr Fletcher Anon.  In SR.  Currently doing well.  Follow.

## 2017-12-12 NOTE — Assessment & Plan Note (Signed)
Controlled on protonix.   

## 2017-12-12 NOTE — Assessment & Plan Note (Signed)
Blood pressure under good control.  Continue same medication regimen.  Follow pressures.  Follow metabolic panel.   

## 2017-12-12 NOTE — Assessment & Plan Note (Signed)
On simvastatin.  Low cholesterol diet and exercise.  Follow lipid panel and liver function tests.   

## 2018-03-03 ENCOUNTER — Other Ambulatory Visit: Payer: Self-pay | Admitting: Internal Medicine

## 2018-05-17 ENCOUNTER — Other Ambulatory Visit: Payer: Self-pay | Admitting: Internal Medicine

## 2018-05-21 ENCOUNTER — Encounter: Payer: Self-pay | Admitting: Internal Medicine

## 2018-05-21 ENCOUNTER — Ambulatory Visit: Payer: Medicare Other | Admitting: Internal Medicine

## 2018-05-21 VITALS — BP 138/74 | HR 62 | Temp 97.8°F | Resp 15 | Ht 76.0 in | Wt 237.8 lb

## 2018-05-21 DIAGNOSIS — I480 Paroxysmal atrial fibrillation: Secondary | ICD-10-CM

## 2018-05-21 DIAGNOSIS — R7301 Impaired fasting glucose: Secondary | ICD-10-CM

## 2018-05-21 DIAGNOSIS — I4891 Unspecified atrial fibrillation: Secondary | ICD-10-CM

## 2018-05-21 DIAGNOSIS — R531 Weakness: Secondary | ICD-10-CM | POA: Diagnosis not present

## 2018-05-21 DIAGNOSIS — R5383 Other fatigue: Secondary | ICD-10-CM

## 2018-05-21 LAB — CBC WITH DIFFERENTIAL/PLATELET
Basophils Absolute: 0 10*3/uL (ref 0.0–0.1)
Basophils Relative: 0.4 % (ref 0.0–3.0)
EOS PCT: 0.9 % (ref 0.0–5.0)
Eosinophils Absolute: 0.1 10*3/uL (ref 0.0–0.7)
HCT: 46 % (ref 39.0–52.0)
Hemoglobin: 15.8 g/dL (ref 13.0–17.0)
LYMPHS ABS: 2.1 10*3/uL (ref 0.7–4.0)
Lymphocytes Relative: 26.1 % (ref 12.0–46.0)
MCHC: 34.2 g/dL (ref 30.0–36.0)
MCV: 86.2 fl (ref 78.0–100.0)
Monocytes Absolute: 0.7 10*3/uL (ref 0.1–1.0)
Monocytes Relative: 8.8 % (ref 3.0–12.0)
NEUTROS ABS: 5.3 10*3/uL (ref 1.4–7.7)
NEUTROS PCT: 63.8 % (ref 43.0–77.0)
PLATELETS: 281 10*3/uL (ref 150.0–400.0)
RBC: 5.34 Mil/uL (ref 4.22–5.81)
RDW: 13.4 % (ref 11.5–15.5)
WBC: 8.2 10*3/uL (ref 4.0–10.5)

## 2018-05-21 LAB — URINALYSIS, ROUTINE W REFLEX MICROSCOPIC
Bilirubin Urine: NEGATIVE
Hgb urine dipstick: NEGATIVE
KETONES UR: NEGATIVE
Leukocytes, UA: NEGATIVE
Nitrite: NEGATIVE
PH: 6.5 (ref 5.0–8.0)
RBC / HPF: NONE SEEN (ref 0–?)
SPECIFIC GRAVITY, URINE: 1.01 (ref 1.000–1.030)
Total Protein, Urine: NEGATIVE
URINE GLUCOSE: NEGATIVE
UROBILINOGEN UA: 0.2 (ref 0.0–1.0)
WBC, UA: NONE SEEN (ref 0–?)

## 2018-05-21 LAB — COMPREHENSIVE METABOLIC PANEL
ALT: 28 U/L (ref 0–53)
AST: 20 U/L (ref 0–37)
Albumin: 4.5 g/dL (ref 3.5–5.2)
Alkaline Phosphatase: 58 U/L (ref 39–117)
BILIRUBIN TOTAL: 0.6 mg/dL (ref 0.2–1.2)
BUN: 16 mg/dL (ref 6–23)
CALCIUM: 9.8 mg/dL (ref 8.4–10.5)
CHLORIDE: 102 meq/L (ref 96–112)
CO2: 31 meq/L (ref 19–32)
Creatinine, Ser: 0.93 mg/dL (ref 0.40–1.50)
GFR: 84.08 mL/min (ref >60.00)
Glucose, Bld: 89 mg/dL (ref 70–99)
Potassium: 4.3 mEq/L (ref 3.5–5.1)
Sodium: 137 mEq/L (ref 135–145)
Total Protein: 7.2 g/dL (ref 6.0–8.3)

## 2018-05-21 LAB — TSH: TSH: 1.08 u[IU]/mL (ref 0.35–4.50)

## 2018-05-21 LAB — HEMOGLOBIN A1C: HEMOGLOBIN A1C: 5.7 % (ref 4.6–6.5)

## 2018-05-21 NOTE — Progress Notes (Signed)
Subjective:  Patient ID: Edward James, male    DOB: November 29, 1940  Age: 77 y.o. MRN: [id number]  CC: The primary encounter diagnosis was Generalized weakness. Diagnoses of Fatigue, unspecified type, Paroxysmal atrial fibrillation ([place]), Impaired fasting glucose, and Atrial fibrillation, unspecified type [facility]) were also pertinent to this visit.  HPI Edward James presents for fatigue and generalized weakness that has been present for the past 2 weeks..  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 He began to sweat profusely, and  felt light headed,  But he did not faint., and he denies chest pain, palpitations,  and shortness of breath . Felt queasy.   Symptoms of presyncope improved with hydration and rest,  But despit drinking gatorade and water  In quantities he felt were sufficient for rehydration, he has continued to feel  generally weak since then .  He denies having dark colored urine and muscle. He has had anorexia for the last 3 days    History of PAF that occurred after back surgery Jan 2017 .  Saw [facility] 2 days ago for sinus  Congestion,  No antibiotics indicated,  Was treated for allergies. He was referred by his dentist because his maxillary sinuses appeared full  On pantograms .   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 aspirin 81 MG tablet Take 81 mg by mouth daily.    Marland Kitchen lisinopril (PRINIVIL,ZESTRIL) 20 MG tablet TAKE 1 TABLET BY MOUTH DAILY 90 tablet 1  . Multiple Vitamins-Minerals (PRESERVISION AREDS PO) Take 1 tablet by mouth daily.    . Omega-3 Fatty Acids (OMEGA-3 FISH OIL PO) Take 2 capsules by mouth daily. Takes 1 tablet by mouth twice daily.    . pantoprazole (PROTONIX) 40 MG tablet TAKE 1 TABLET BY MOUTH DAILY 90 tablet 1  . simvastatin (ZOCOR) 10 MG tablet TAKE 1 TABLET BY MOUTH DAILY 90  tablet 1  . triamcinolone (NASACORT) 55 MCG/ACT AERO nasal inhaler Place into the nose.    . triamterene-hydrochlorothiazide (MAXZIDE-25) 37.5-25 MG tablet TAKE ONE-HALF TABLET BY MOUTH ONCE A DAY 45 tablet 1   No facility-administered medications prior to visit.     Review of Systems;  Patient denies headache, fevers, malaise, unintentional weight loss, skin rash, eye pain, sinus congestion and sinus pain, sore throat, dysphagia,  hemoptysis , cough, dyspnea, wheezing, chest pain, palpitations, orthopnea, edema, abdominal pain, nausea, melena, diarrhea, constipation, flank pain, dysuria, hematuria, urinary  Frequency, nocturia, numbness, tingling, seizures,  Focal weakness, Loss of consciousness,  Tremor, insomnia, depression, anxiety, and suicidal ideation.      Objective:  BP 138/74 (BP Location: Left Arm, Patient Position: Sitting, Cuff Size: Normal)   Pulse 62   Temp 97.8 F (36.6 C) (Oral)   Resp 15   Ht 6\' 4"  (1.93 m)   Wt 237 lb 12.8 oz (107.9 kg)   SpO2 98%   BMI 28.95 kg/m   BP Readings from Last 3 Encounters:  05/21/18 138/74  12/09/17 136/78  06/10/17 138/72    Wt Readings from Last 3 Encounters:  05/21/18 237 lb 12.8 oz (107.9 kg)  12/09/17 251 lb 6.4 oz (114 kg)  06/10/17 245 lb 12.8 oz (111.5 kg)    General appearance: alert, cooperative and appears stated age Ears: normal TM's and external ear canals both ears Throat: lips, mucosa, and tongue normal;  teeth and gums normal Neck: no adenopathy, no carotid bruit, supple, symmetrical, trachea midline and thyroid not enlarged, symmetric, no tenderness/mass/nodules Back: symmetric, no curvature. ROM normal. No CVA tenderness. Lungs: clear to auscultation bilaterally Heart: regular rate and rhythm, S1, S2 normal, no murmur, click, rub or gallop Abdomen: soft, non-tender; bowel sounds normal; no masses,  no organomegaly Pulses: 2+ and symmetric Skin: Skin color, texture, turgor normal. No rashes or lesions Lymph  nodes: Cervical, supraclavicular, and axillary nodes normal.  Lab Results  Component Value Date   HGBA1C 5.7 05/21/2018    Lab Results  Component Value Date   CREATININE 0.93 05/21/2018   CREATININE 0.89 12/07/2017   CREATININE 0.89 06/09/2017    Lab Results  Component Value Date   WBC 8.2 05/21/2018   HGB 15.8 05/21/2018   HCT 46.0 05/21/2018   PLT 281.0 05/21/2018   GLUCOSE 89 05/21/2018   CHOL 128 12/07/2017   TRIG 73.0 12/07/2017   HDL 41.50 12/07/2017   LDLCALC 72 12/07/2017   ALT 28 05/21/2018   AST 20 05/21/2018   NA 137 05/21/2018   K 4.3 05/21/2018   CL 102 05/21/2018   CREATININE 0.93 05/21/2018   BUN 16 05/21/2018   CO2 31 05/21/2018   TSH 1.08 05/21/2018   PSA 0.09 (L) 12/07/2017   HGBA1C 5.7 05/21/2018    No results found.  Assessment & Plan:   Problem List Items Addressed This Visit    Atrial fibrillation ([place])    Previous episode occurred in 2017 after surgery durig a period of volume depletion and he was treated briefly for RVR per review of records. He was not anticoagulated since the episode was paroxysmal.  ECHO done in 2017 noted normal EF  And mild MR. Current history and EKG today suggestive of return,  Although rate controlled.   No eliciting factors other than an epsiode of dehydration occurring 2 weeks ago.  Referring to [facility] for evaluation.    Patient's CHADS 2 VASC Score is 3 (CHF:0, HTN:1, Age of 77:1,  Male gender ;1)  .Marland Kitchen  Anticoagulation is recommended.      Relevant Medications   ELIQUIS STARTER PACK (ELIQUIS STARTER PACK) 5 MG TABS   Other Relevant Orders   EKG 12-Lead (Completed)   Ambulatory referral to Cardiology   Generalized weakness - Primary    Appears to be due to recurrence of atrial fibrillation.  Thyroid, CBC,  Renal function,  All normal.        Relevant Orders   Comprehensive metabolic panel (Completed)   CK total and CKMB (cardiac)not at [facility] (Completed)   Urinalysis, Routine w reflex microscopic (Completed)     Urine Culture    Other Visit Diagnoses    Fatigue, unspecified type       Relevant Orders   CBC with Differential/Platelet (Completed)   TSH (Completed)   Impaired fasting glucose       Relevant Orders   Hemoglobin A1c (Completed)     A total of 40 minutes was spent with patient more than half of which was spent in counseling patient on the above mentioned issues , reviewing and explaining recent labs and imaging studies done, and coordination of care.  I am having Edward James start on [employer]. I am also having him maintain his aspirin, Omega-3 Fatty Acids (OMEGA-3 FISH OIL PO), triamcinolone, simvastatin, pantoprazole, triamterene-hydrochlorothiazide, lisinopril, and Multiple Vitamins-Minerals (PRESERVISION AREDS PO).  Meds ordered this encounter  Medications  . ELIQUIS STARTER PACK (ELIQUIS STARTER  PACK) 5 MG TABS    Sig: Take as directed on package: start with two-5mg  tablets twice daily for 7 days. On day 8, switch to one-5mg  tablet twice daily.    Dispense:  1 each    Refill:  0    There are no discontinued medications.  Follow-up: No follow-ups on file.   Crecencio Mc, MD

## 2018-05-21 NOTE — Patient Instructions (Addendum)
You appear to be in atrial fibrillation.  This may be the cause of your fatigue.  I am referring you back to Dr Fletcher Anon for evaluation .  You may need to be on a blood thinner to minimize your risk for stroke.  I will let you know after I see your labs     Atrial Fibrillation Atrial fibrillation is a type of heartbeat that is irregular or fast (rapid). If you have this condition, your heart keeps quivering in a weird (chaotic) way. This condition can make it so your heart cannot pump blood normally. Having this condition gives a person more risk for stroke, heart failure, and other heart problems. There are different types of atrial fibrillation. Talk with your doctor to learn about the type that you have. Follow these instructions at home:  Take over-the-counter and prescription medicines only as told by your doctor.  If your doctor prescribed a blood-thinning medicine, take it exactly as told. Taking too much of it can cause bleeding. If you do not take enough of it, you will not have the protection that you need against stroke and other problems.  Do not use any tobacco products. These include cigarettes, chewing tobacco, and e-cigarettes. If you need help quitting, ask your doctor.  If you have apnea (obstructive sleep apnea), manage it as told by your doctor.  Do not drink alcohol.  Do not drink beverages that have caffeine. These include coffee, soda, and tea.  Maintain a healthy weight. Do not use diet pills unless your doctor says they are safe for you. Diet pills may make heart problems worse.  Follow diet instructions as told by your doctor.  Exercise regularly as told by your doctor.  Keep all follow-up visits as told by your doctor. This is important. Contact a doctor if:  You notice a change in the speed, rhythm, or strength of your heartbeat.  You are taking a blood-thinning medicine and you notice more bruising.  You get tired more easily when you move or exercise. Get  help right away if:  You have pain in your chest or your belly (abdomen).  You have sweating or weakness.  You feel sick to your stomach (nauseous).  You notice blood in your throw up (vomit), poop (stool), or pee (urine).  You are short of breath.  You suddenly have swollen feet and ankles.  You feel dizzy.  Your suddenly get weak or numb in your face, arms, or legs, especially if it happens on one side of your body.  You have trouble talking, trouble understanding, or both.  Your face or your eyelid droops on one side. These symptoms may be an emergency. Do not wait to see if the symptoms will go away. Get medical help right away. Call your local emergency services (911 in the [place] Do not drive yourself to the hospital. This information is not intended to replace advice given to you by your health care provider. Make sure you discuss any questions you have with your health care provider. Document Released: 05/27/2008 Document Revised: 01/24/2016 Document Reviewed: 12/13/2014 Elsevier Interactive Patient Education  [employer].

## 2018-05-21 NOTE — Assessment & Plan Note (Addendum)
Previous episode occurred in 2017 after surgery durig a period of volume depletion and he was treated briefly for RVR per review of records. He was not anticoagulated since the episode was paroxysmal.  ECHO done in 2017 noted normal EF  And mild MR. Current history and EKG today suggestive of return,  Although rate controlled.   No eliciting factors other than an epsiode of dehydration occurring 2 weeks ago.  Referring to [facility] for evaluation.    Patient's CHADS 2 VASC Score is 3 (CHF:0, HTN:1, Age of 77:1,  Male gender ;1)  .Marland Kitchen  Anticoagulation is recommended.

## 2018-05-22 DIAGNOSIS — R531 Weakness: Secondary | ICD-10-CM | POA: Insufficient documentation

## 2018-05-22 LAB — URINE CULTURE
MICRO NUMBER: 91132488
RESULT: NO GROWTH
SPECIMEN QUALITY:: ADEQUATE

## 2018-05-22 LAB — CK TOTAL AND CKMB (NOT AT ARMC)
CK, MB: 6.7 ng/mL — ABNORMAL HIGH (ref 0–5.0)
RELATIVE INDEX: 3.7 (ref 0–4.0)
Total CK: 180 U/L (ref 44–196)

## 2018-05-22 MED ORDER — ELIQUIS 5 MG VTE STARTER PACK: ORAL_TABLET | ORAL | 0 refills | Status: DC

## 2018-05-22 NOTE — Assessment & Plan Note (Signed)
Appears to be due to recurrence of atrial fibrillation.  Thyroid, CBC,  Renal function,  All normal.

## 2018-05-23 ENCOUNTER — Other Ambulatory Visit: Payer: Self-pay | Admitting: Internal Medicine

## 2018-05-23 ENCOUNTER — Telehealth: Payer: Self-pay | Admitting: Family Medicine

## 2018-05-23 DIAGNOSIS — R748 Abnormal levels of other serum enzymes: Secondary | ICD-10-CM

## 2018-05-23 NOTE — Telephone Encounter (Signed)
Thank you. I was able to reach him today  by phone.

## 2018-05-23 NOTE — Telephone Encounter (Addendum)
Received a call from after hours lineSuanne Marker, RN.   PCP: DR. Nicki Reaper Ordering: Dr. Derrel Nip  Critical lab with a CK MB 6.7, high normal CK 180, in a pt with A.Fib and 2 weeks symptoms of fatigue and generalized weakness after an event of presumed over exertion.  Pt has > 3 risk factors, ongoing symptoms is at least moderately suspicious for ongoing cardiac injury. Attempted to call pt to advise him to go to ED for further eval if still having any symptoms or close follow up with pcp to discuss management if he is seeing improvement.  Tried to contact  via both listed numbers  - No answer on home phone and mobile is disconnected. - 2nd attempt also unable to reach pt . - will forward to pts PCP and ordering physician to address Monday morning.   Electronically Signed by: Howard Pouch, DO  primary Care

## 2018-05-24 ENCOUNTER — Other Ambulatory Visit: Payer: Self-pay | Admitting: Internal Medicine

## 2018-05-24 ENCOUNTER — Telehealth: Payer: Self-pay | Admitting: Internal Medicine

## 2018-05-24 ENCOUNTER — Other Ambulatory Visit (INDEPENDENT_AMBULATORY_CARE_PROVIDER_SITE_OTHER): Payer: Medicare Other

## 2018-05-24 DIAGNOSIS — R748 Abnormal levels of other serum enzymes: Secondary | ICD-10-CM | POA: Diagnosis not present

## 2018-05-24 DIAGNOSIS — I10 Essential (primary) hypertension: Secondary | ICD-10-CM

## 2018-05-24 DIAGNOSIS — E7800 Pure hypercholesterolemia, unspecified: Secondary | ICD-10-CM

## 2018-05-24 LAB — CK TOTAL AND CKMB (NOT AT ARMC)
CK TOTAL: 123 U/L (ref 44–196)
CK, MB: 6.2 ng/mL — AB (ref 0–5.0)
Relative Index: 5 — ABNORMAL HIGH (ref 0–4.0)

## 2018-05-24 LAB — TROPONIN I: TNIDX: 0.01 ug/l (ref 0.00–0.06)

## 2018-05-24 LAB — TIQ-NTR

## 2018-05-24 MED ORDER — APIXABAN 5 MG PO TABS: 5.0000 mg | ORAL_TABLET | Freq: Two times a day (BID) | ORAL | 5 refills | Status: DC

## 2018-05-24 NOTE — Telephone Encounter (Signed)
Spoke [facility] and they stated that they did fill the 5mg  and not the starter pack.

## 2018-05-24 NOTE — Telephone Encounter (Signed)
PLEASE CALL [facility] TO MAKE SURE THEY FILL THE CURRENT ELIQUIS (APIXABAN) PRESCRIPTION IN CHART,  FOR 5 MG BID.  Not THE STARTER PACK

## 2018-05-24 NOTE — Telephone Encounter (Signed)
-----   Message from Crecencio Mc, MD sent at 05/23/2018  5:47 PM EDT ----- Regarding: patient labs  Edward James,  I saw this patient for a two week history of generalized weakness and  fatigue on Friday.  Symptoms started 2 weeks prior after getting overheated working outside.  He had no history of chest pain or shortness of breath and his vital signs were normal.  He even sounded like sinus rhythm but given his history I checked an EKG and atrial fib was suggested,  Rate in the 60's .  His cardiac  Markers were slightly abnormal  but his index was normal.   I attributed the elevated CK MB to new onset atrial fib, but I did not feel that he needed to go to the ER .  I sent him a mychart message over the weekend bc I was unable to reach him otherwise.  I have recommended he start anticoagulation and see Arida.  I will have Janett Billow  Try to reach him on Monday and we can repeat the cardiac enzymes.  Helene Kelp

## 2018-05-24 NOTE — Telephone Encounter (Signed)
Per Dr Derrel Nip.  Case discussed with Dr Fletcher Anon.  Has ordered f/u labs and will f/u with cardiology.

## 2018-05-24 NOTE — Addendum Note (Signed)
Addended by: Leeanne Rio on: 05/24/2018 12:55 PM   Modules accepted: Orders

## 2018-05-25 ENCOUNTER — Telehealth: Payer: Self-pay

## 2018-05-25 NOTE — Telephone Encounter (Signed)
Critical lab called in. CKMB is 6.2. Was 6.7 4 days ago.

## 2018-05-25 NOTE — Telephone Encounter (Signed)
Dr Derrel Nip already addressed.  Instructed to keep appt with Dr Fletcher Anon this week.

## 2018-05-26 ENCOUNTER — Emergency Department: Payer: Medicare Other

## 2018-05-26 ENCOUNTER — Emergency Department
Admission: EM | Admit: 2018-05-26 | Discharge: 2018-05-26 | Disposition: A | Discharge status: Home / Self Care | Payer: Medicare Other | Attending: Emergency Medicine | Admitting: Emergency Medicine

## 2018-05-26 ENCOUNTER — Other Ambulatory Visit: Payer: Self-pay

## 2018-05-26 ENCOUNTER — Encounter: Payer: Self-pay | Admitting: Emergency Medicine

## 2018-05-26 DIAGNOSIS — Z79899 Other long term (current) drug therapy: Secondary | ICD-10-CM | POA: Diagnosis not present

## 2018-05-26 DIAGNOSIS — R0789 Other chest pain: Secondary | ICD-10-CM

## 2018-05-26 DIAGNOSIS — R002 Palpitations: Secondary | ICD-10-CM

## 2018-05-26 DIAGNOSIS — Z8546 Personal history of malignant neoplasm of prostate: Secondary | ICD-10-CM | POA: Insufficient documentation

## 2018-05-26 DIAGNOSIS — Z87891 Personal history of nicotine dependence: Secondary | ICD-10-CM | POA: Insufficient documentation

## 2018-05-26 DIAGNOSIS — I10 Essential (primary) hypertension: Secondary | ICD-10-CM | POA: Diagnosis not present

## 2018-05-26 DIAGNOSIS — Z7982 Long term (current) use of aspirin: Secondary | ICD-10-CM | POA: Diagnosis not present

## 2018-05-26 HISTORY — DX: Unspecified atrial fibrillation: I48.91

## 2018-05-26 LAB — CBC WITH DIFFERENTIAL/PLATELET
BASOS PCT: 0 %
Basophils Absolute: 0 10*3/uL (ref 0–0.1)
EOS ABS: 0.1 10*3/uL (ref 0–0.7)
EOS PCT: 1 %
HCT: 45 % (ref 40.0–52.0)
Hemoglobin: 15.6 g/dL (ref 13.0–18.0)
Lymphocytes Relative: 28 %
Lymphs Abs: 2.5 10*3/uL (ref 1.0–3.6)
MCH: 29.9 pg (ref 26.0–34.0)
MCHC: 34.8 g/dL (ref 32.0–36.0)
MCV: 86.2 fL (ref 80.0–100.0)
MONO ABS: 0.7 10*3/uL (ref 0.2–1.0)
Monocytes Relative: 8 %
Neutro Abs: 5.8 10*3/uL (ref 1.4–6.5)
Neutrophils Relative %: 63 %
PLATELETS: 246 10*3/uL (ref 150–440)
RBC: 5.22 MIL/uL (ref 4.40–5.90)
RDW: 13.2 % (ref 11.5–14.5)
WBC: 9.2 10*3/uL (ref 3.8–10.6)

## 2018-05-26 LAB — COMPREHENSIVE METABOLIC PANEL
ALBUMIN: 4.5 g/dL (ref 3.5–5.0)
ALT: 31 U/L (ref 0–44)
AST: 23 U/L (ref 15–41)
Alkaline Phosphatase: 57 U/L (ref 38–126)
Anion gap: 5 (ref 5–15)
BUN: 13 mg/dL (ref 8–23)
CHLORIDE: 105 mmol/L (ref 98–111)
CO2: 28 mmol/L (ref 22–32)
Calcium: 9.4 mg/dL (ref 8.9–10.3)
Creatinine, Ser: 0.89 mg/dL (ref 0.61–1.24)
GFR calc Af Amer: >60 mL/min (ref >60)
GLUCOSE: 112 mg/dL — AB (ref 70–99)
POTASSIUM: 4.2 mmol/L (ref 3.5–5.1)
SODIUM: 138 mmol/L (ref 135–145)
Total Bilirubin: 1 mg/dL (ref 0.3–1.2)
Total Protein: 7.3 g/dL (ref 6.5–8.1)

## 2018-05-26 LAB — TROPONIN I

## 2018-05-26 NOTE — ED Provider Notes (Signed)
[facility] Emergency Department Provider Note  ____________________________________________   First MD Initiated Contact with Patient 05/26/18 0503     (approximate)  I have reviewed the triage vital signs and the nursing notes.   HISTORY  Chief Complaint Palpitations    HPI Edward James is a 77 y.o. male with medical history as listed below which notably includes but is probably paroxysmal atrial fibrillation as well as a prior history of sinus bradycardia.  He presents for evaluation of feeling jittery and nervous with some palpitations.  He did not have any chest pain.  He is uncertain whether or not he had a little bit of pressure sensation around his left shoulder.  His recent history includes a visit to his primary care doctor a few days ago where he had an EKG that demonstrated atrial fibrillation.  He was started on Eliquis and he has an appointment in 2 days with Dr. Fletcher Anon for further evaluation.  He has been very worried about the atrial fibrillation and was told to take it easy and not engage into any activities, but is usually very active person.  He was told by his primary care provider that if he feels anything "unusual" he should go to the emergency department for further evaluation, "so that is why I am here".  He says that the jitteriness and nervousness that he felt earlier has resolved after coming to the emergency department he has been here for a few hours.  Symptoms were mild, patient is unsure whether they were acute in onset or gradual, nothing particular made it better or worse.  Past Medical History:  Diagnosis Date  . Atrial fibrillation ([place])   . Diverticulosis   . GERD (gastroesophageal reflux disease)   . Hypercholesterolemia   . Hypertension   . Prostate cancer [facility])    s/p radical retropubic prostatectomy 1999  . Spinal stenosis     Patient Active Problem List   Diagnosis Date Noted  . Generalized weakness 05/22/2018  .  Inguinal hernia 12/03/2015  . Atrial fibrillation ([place]) 10/09/2015  . Environmental allergies 05/13/2015  . Gas 05/13/2015  . Back skin lesion 11/06/2014  . Health care maintenance 11/06/2014  . Back pain 08/12/2014  . Hypertension 08/20/2012  . Hypercholesterolemia 08/20/2012  . GERD (gastroesophageal reflux disease) 08/20/2012  . History of prostate cancer 08/20/2012    Past Surgical History:  Procedure Laterality Date  . FOLEY CATHETER    . INGUINAL HERNIA REPAIR  1945  . INGUINAL HERNIA REPAIR Left 12/04/2015   Procedure: HERNIA REPAIR INGUINAL ADULT;  Surgeon: Leonie Green, MD;  Location: [facility] ORS;  Service: General;  Laterality: Left;  . LUMBAR FUSION     L3-4 fusion  . pilonidal cyst removal  1969  . RETROPUBIC PROSTATECTOMY     radical (1999)  . [place]    Prior to Admission medications   Medication Sig Start Date End Date Taking? Authorizing Provider  apixaban (ELIQUIS) 5 MG TABS tablet Take 1 tablet (5 mg total) by mouth 2 (two) times daily. 05/24/18  Yes Crecencio Mc, MD  aspirin 81 MG tablet Take 81 mg by mouth daily.   Yes [provider]  lisinopril (PRINIVIL,ZESTRIL) 20 MG tablet TAKE 1 TABLET BY MOUTH DAILY 05/18/18  Yes Einar Pheasant, MD  Multiple Vitamins-Minerals (PRESERVISION AREDS PO) Take 1 tablet by mouth daily.   Yes [provider]  Omega-3 Fatty Acids (OMEGA-3 FISH OIL PO) Take 2 capsules by mouth  daily. Takes 1 tablet by mouth twice daily.   Yes [provider]  pantoprazole (PROTONIX) 40 MG tablet TAKE 1 TABLET BY MOUTH DAILY 03/03/18  Yes Einar Pheasant, MD  simvastatin (ZOCOR) 10 MG tablet TAKE 1 TABLET BY MOUTH DAILY 11/17/17  Yes Einar Pheasant, MD  triamcinolone (NASACORT) 55 MCG/ACT AERO nasal inhaler Place 2 sprays into the nose daily as needed (congestion).    Yes [provider]  triamterene-hydrochlorothiazide (MAXZIDE-25) 37.5-25 MG tablet TAKE ONE-HALF TABLET BY MOUTH  ONCE A DAY Patient taking differently: Take 0.5 tablets by mouth daily.  05/18/18  Yes Einar Pheasant, MD    Allergies Patient has no known allergies.  Family History  Problem Relation Age of Onset  . AAA (abdominal aortic aneurysm) Father   . Nephrolithiasis Unknown   . Colon cancer Neg Hx     Social History Social History   Tobacco Use  . Smoking status: Former Smoker    Last attempt to quit: 11/28/1973    Years since quitting: 44.5  . Smokeless tobacco: Never Used  Substance Use Topics  . Alcohol use: No    Alcohol/week: 0.0 standard drinks    Comment: occasional  . Drug use: No    Review of Systems Constitutional: No fever/chills Eyes: No visual changes. ENT: No sore throat. Cardiovascular: Palpitations. Denies chest pain. Respiratory: Denies shortness of breath. Gastrointestinal: No abdominal pain.  No nausea, no vomiting.  No diarrhea.  No constipation. Genitourinary: Negative for dysuria. Musculoskeletal: Negative for neck pain.  Negative for back pain. Integumentary: Negative for rash. Neurological: Negative for headaches, focal weakness or numbness. Psychiatric:Feeling jittery and nervous  ____________________________________________   PHYSICAL EXAM:  VITAL SIGNS: ED Triage Vitals  Enc Vitals Group     BP 05/26/18 0328 (!) 146/76     Pulse Rate 05/26/18 0328 (!) 57     Resp 05/26/18 0328 18     Temp 05/26/18 0328 97.9 F (36.6 C)     Temp Source 05/26/18 0328 Oral     SpO2 05/26/18 0328 98 %     Weight 05/26/18 0329 107.5 kg (237 lb)     Height 05/26/18 0329 1.93 m (6\' 4" )     Head Circumference --      Peak Flow --      Pain Score 05/26/18 0328 0     Pain Loc --      Pain Edu? --      Excl. in [place]? --     Constitutional: Alert and oriented. Well appearing and in no acute distress. Eyes: Conjunctivae are normal.  Head: Atraumatic. Nose: No congestion/rhinnorhea. Mouth/Throat: Mucous membranes are moist. Neck: No stridor.  No meningeal  signs.   Cardiovascular: Normal rate, regular rhythm. Good peripheral circulation. Grossly normal heart sounds. Respiratory: Normal respiratory effort.  No retractions. Lungs CTAB. Gastrointestinal: Soft and nontender. No distention.  Musculoskeletal: No lower extremity tenderness nor edema. No gross deformities of extremities. Neurologic:  Normal speech and language. No gross focal neurologic deficits are appreciated.  Skin:  Skin is warm, dry and intact. No rash noted. Psychiatric: Mood and affect are normal. Speech and behavior are normal.  Calm and appropriate.  ____________________________________________   LABS (all labs ordered are listed, but only abnormal results are displayed)  Labs Reviewed  COMPREHENSIVE METABOLIC PANEL - Abnormal; Notable for the following components:      Result Value   Glucose, Bld 112 (*)    All other components within normal limits  CBC WITH DIFFERENTIAL/PLATELET  TROPONIN I   ____________________________________________  EKG  ED ECG REPORT I, Hinda Kehr, the attending physician, personally viewed and interpreted this ECG.  Date: 05/26/2018 EKG Time: 3:32 Rate: 53 Rhythm: sinus bradycardia QRS Axis: normal Intervals: Left anterior fascicular block ST/T Wave abnormalities: No concerning ST segment or T wave changes Narrative Interpretation: no evidence of acute ischemia   ____________________________________________  RADIOLOGY I, Hinda Kehr, personally viewed and evaluated these images (plain radiographs) as part of my medical decision making, as well as reviewing the written report by the radiologist.  ED MD interpretation: No active cardiopulmonary disease  Official radiology report(s): Dg Chest 2 View  Result Date: 05/26/2018 CLINICAL DATA:  77 year old male with chest pain. EXAM: CHEST - 2 VIEW COMPARISON:  None. FINDINGS: The heart size and mediastinal contours are within normal limits. Both lungs are clear. The visualized  skeletal structures are unremarkable. IMPRESSION: No active cardiopulmonary disease. Electronically Signed   By: Anner Crete M.D.   On: 05/26/2018 04:39    ____________________________________________   PROCEDURES  Critical Care performed: No   Procedure(s) performed:   Procedures   ____________________________________________   INITIAL IMPRESSION / ASSESSMENT AND PLAN / ED COURSE  As part of my medical decision making, I reviewed the following data within the [place] notes reviewed and incorporated, Labs reviewed , EKG interpreted , Old chart reviewed, Radiograph reviewed  and Notes from prior ED visits    Differential diagnosis includes, but is not limited to, paroxysmal atrial fibrillation, anxiety, electrolyte abnormality, acute infectious process, much less likely ACS.  The patient's EKG is notable for sinus bradycardia which she says is his normal.  The atrial fibrillation that was seen on EKG at his primary care provider is a new diagnosis and it seems to be intermittent.  He is taking Eliquis as prescribed.  He admits to being quite nervous about his recent doctor visits in the upcoming visit with cardiology in 2 days.  He has had no chest pain or shortness of breath.  We had a long conversation I went over the results of his testing today which included very reassuring labs with no acute abnormalities on conference of metabolic panel, CBC, nor troponin.  His chest x-ray is clear and his EKG is reassuring.  Patient is comfortable with the plan for discharge and outpatient follow-up with cardiology as scheduled in 2 days.  I gave my usual and customary return precautions.     ____________________________________________  FINAL CLINICAL IMPRESSION(S) / ED DIAGNOSES  Final diagnoses:  Palpitations  Chest discomfort     MEDICATIONS GIVEN DURING THIS VISIT:  Medications - No data to display   ED Discharge Orders    None       Note:   This document was prepared using Dragon voice recognition software and may include unintentional dictation errors.    Hinda Kehr, MD 05/26/18 [phone]

## 2018-05-26 NOTE — Discharge Instructions (Signed)
Your workup in the Emergency Department today was reassuring.  We did not find any specific abnormalities.  We recommend you drink plenty of fluids, take your regular medications and/or any new ones prescribed today, and follow up with the doctor(s) listed in these documents as recommended.  Return to the Emergency Department if you develop new or worsening symptoms that concern you.  

## 2018-05-26 NOTE — ED Triage Notes (Signed)
Patient ambulatory to triage with steady gait, without difficulty or distress noted; pt reports feels "nervous, jittery"; dx a fib on Friday, has appt with Dr Fletcher Anon on Friday; rx eloquist on Monday and hasn't felt right since starting med; denies pain but st feels "fluttering"

## 2018-05-28 ENCOUNTER — Ambulatory Visit: Payer: Medicare Other | Admitting: Cardiovascular Disease

## 2018-05-28 VITALS — BP 124/78 | HR 73 | Ht 76.0 in | Wt 235.2 lb

## 2018-05-28 DIAGNOSIS — I10 Essential (primary) hypertension: Secondary | ICD-10-CM | POA: Diagnosis not present

## 2018-05-28 DIAGNOSIS — E785 Hyperlipidemia, unspecified: Secondary | ICD-10-CM

## 2018-05-28 DIAGNOSIS — I480 Paroxysmal atrial fibrillation: Secondary | ICD-10-CM | POA: Diagnosis not present

## 2018-05-28 DIAGNOSIS — R079 Chest pain, unspecified: Secondary | ICD-10-CM | POA: Diagnosis not present

## 2018-05-28 NOTE — Patient Instructions (Addendum)
Medication Instructions: STOP Eliquis  If you need a refill on your cardiac medications before your next appointment, please call your pharmacy.   Procedures/Testing: Your physician has requested that you have an exercise stress myoview. For further information please visit [email or web address]. Please follow instruction sheet, as given.  Follow-Up: Your physician wants you to follow-up in 12 months with Dr. Fletcher Anon. You will receive a reminder letter in the mail two months in advance. If you don't receive a letter, please call our office at [phone] to schedule this follow-up appointment.   Special Instructions:   [place]  Your provider has ordered a Stress Test with nuclear imaging. The purpose of this test is to evaluate the blood supply to your heart muscle. This procedure is referred to as a "Non-Invasive Stress Test." This is because other than having an IV started in your vein, nothing is inserted or "invades" your body. Cardiac stress tests are done to find areas of poor blood flow to the heart by determining the extent of coronary artery disease (CAD). Some patients exercise on a treadmill, which naturally increases the blood flow to your heart, while others who are unable to walk on a treadmill due to physical limitations have a pharmacologic/chemical stress agent called Lexiscan . This medicine will mimic walking on a treadmill by temporarily increasing your coronary blood flow.   Please note: these test may take anywhere between 2-4 hours to complete  PLEASE REPORT TO [facility] AT THE FIRST DESK WILL DIRECT YOU WHERE TO GO  Date of Procedure:_____________________________________  Arrival Time for Procedure:______________________________  Instructions regarding medication:  None to hold  __PLEASE NOTIFY THE OFFICE AT LEAST 24 HOURS IN ADVANCE IF YOU ARE UNABLE TO KEEP YOUR APPOINTMENT.  [phone] AND  PLEASE NOTIFY NUCLEAR MEDICINE  AT [facility] AT LEAST 24 HOURS IN ADVANCE IF YOU ARE UNABLE TO KEEP YOUR APPOINTMENT. [phone]  How to prepare for your Myoview test:  1. Do not eat or drink after midnight 2. No caffeine for 24 hours prior to test 3. No smoking 24 hours prior to test. 4. Your medication may be taken with water.  If your doctor stopped a medication because of this test, do not take that medication. 5. Ladies, please do not wear dresses.  Skirts or pants are appropriate. Please wear a short sleeve shirt. 6. No perfume, cologne or lotion. 7. Wear comfortable walking shoes. No heels!    Thank you for choosing [facility] at [facility]!

## 2018-05-28 NOTE — Progress Notes (Signed)
Cardiology Office Note   Date:  05/28/2018   ID:  Edward James, DOB 03/24/1941, MRN [id number]  PCP:  Edward Pheasant, MD  Cardiologist:   Edward Sacramento, MD   Chief Complaint  Patient presents with  . other    Afib. Dizziness walking across parking lot this am. Medications reviewed verbally.      History of Present Illness: Edward James is a 77 y.o. male who presents for a follow-up visit regarding paroxysmal atrial fibrillation.  He has known history of hypertension, hyperlipidemia, chronic back pain and GERD. He is not a diabetic and does not smoke.He has no family history of coronary artery disease.  He underwent cardiac work-up in 2017 which included a nuclear stress test which showed no evidence of ischemia with normal ejection fraction. He had one episode of atrial fibrillation with RVR in the setting of having a Foley catheter placement for lumbar surgery and some volume depletion.  He converted to sinus rhythm after he was given IV fluids and 1 dose of metoprolol. Echocardiogram was done which showed normal LV systolic function with mild aortic regurgitation, mildly dilated aortic root and mild mitral regurgitation.  Given that he had only one isolated episode of atrial fibrillation, he was not started on treatment. He was seen by Dr. Derrel James recently and complained of generalized fatigue and weakness for about 2 weeks.  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  He had symptoms of presyncope.  He had an EKG done during that visit which was read as atrial fibrillation.  However, I reviewed the EKG and there was a lot of motion artifacts.  Nonetheless, P waves for visible which rules out atrial fibrillation.  There were frequent PACs. He had labs done which showed normal CBC, normal CMP and normal TSH.  CK-MB was mildly elevated at 6.7.  He went to the emergency room recently with palpitations and some discomfort around his left shoulder.  He  reported some gas and pressure feeling in his chest with belching.  His EKG showed sinus bradycardia with left anterior fascicular block.  Troponin was negative.  He seems to be anxious.  Past Medical History:  Diagnosis Date  . Atrial fibrillation ([place])   . Diverticulosis   . GERD (gastroesophageal reflux disease)   . Hypercholesterolemia   . Hypertension   . Prostate cancer [facility])    s/p radical retropubic prostatectomy 1999  . Spinal stenosis     Past Surgical History:  Procedure Laterality Date  . FOLEY CATHETER    . INGUINAL HERNIA REPAIR  1945  . INGUINAL HERNIA REPAIR Left 12/04/2015   Procedure: HERNIA REPAIR INGUINAL ADULT;  Surgeon: Leonie Green, MD;  Location: [facility] ORS;  Service: General;  Laterality: Left;  . LUMBAR FUSION     L3-4 fusion  . pilonidal cyst removal  1969  . RETROPUBIC PROSTATECTOMY     radical (1999)  . TONSILLECTOMY AND ADENOIDECTOMY  1948     Current Outpatient Medications  Medication Sig Dispense Refill  . apixaban (ELIQUIS) 5 MG TABS tablet Take 1 tablet (5 mg total) by mouth 2 (two) times daily. 60 tablet 5  . aspirin 81 MG tablet Take 81 mg by mouth daily.    Marland Kitchen lisinopril (PRINIVIL,ZESTRIL) 20 MG tablet TAKE 1 TABLET BY MOUTH DAILY 90 tablet 1  . Multiple Vitamins-Minerals (PRESERVISION AREDS PO) Take 1 tablet by mouth daily.    . Omega-3 Fatty Acids (OMEGA-3 FISH OIL PO) Take 2  capsules by mouth daily. Takes 1 tablet by mouth twice daily.    . pantoprazole (PROTONIX) 40 MG tablet TAKE 1 TABLET BY MOUTH DAILY 90 tablet 1  . simvastatin (ZOCOR) 10 MG tablet TAKE 1 TABLET BY MOUTH DAILY 90 tablet 1  . triamcinolone (NASACORT) 55 MCG/ACT AERO nasal inhaler Place 2 sprays into the nose daily as needed (congestion).     . triamterene-hydrochlorothiazide (MAXZIDE-25) 37.5-25 MG tablet TAKE ONE-HALF TABLET BY MOUTH ONCE A DAY (Patient taking differently: Take 0.5 tablets by mouth daily. ) 45 tablet 1   No current facility-administered  medications for this visit.     Allergies:   Patient has no known allergies.    Social History:  The patient  reports that he quit smoking about 44 years ago. He has never used smokeless tobacco. He reports that he does not drink alcohol or use drugs.   Family History:  The patient's family history includes AAA (abdominal aortic aneurysm) in his father; Nephrolithiasis in his unknown relative.    ROS:  Please see the history of present illness.   Otherwise, review of systems are positive for none.   All other systems are reviewed and negative.    PHYSICAL EXAM: VS:  BP 124/78 (BP Location: Left Arm, Patient Position: Sitting, Cuff Size: Normal)   Pulse 73   Ht 6\' 4"  (1.93 m)   Wt 235 lb 4 oz (106.7 kg)   BMI 28.64 kg/m  , BMI Body mass index is 28.64 kg/m. GEN: Well nourished, well developed, in no acute distress  HEENT: normal  Neck: no JVD, carotid bruits, or masses Cardiac: RRR; no murmurs, rubs, or gallops,no edema  Respiratory:  clear to auscultation bilaterally, normal work of breathing GI: soft, nontender, nondistended, + BS MS: no deformity or atrophy  Skin: warm and dry, no rash Neuro:  Strength and sensation are intact Psych: euthymic mood, full affect   EKG:  EKG is ordered today. The ekg ordered today demonstrates normal sinus rhythm with left anterior fascicular block.   Recent Labs: 05/21/2018: TSH 1.08 05/26/2018: ALT 31; BUN 13; Creatinine, Ser 0.89; Hemoglobin 15.6; Platelets 246; Potassium 4.2; Sodium 138    Lipid Panel    Component Value Date/Time   CHOL 128 12/07/2017 0807   TRIG 73.0 12/07/2017 0807   HDL 41.50 12/07/2017 0807   CHOLHDL 3 12/07/2017 0807   VLDL 14.6 12/07/2017 0807   LDLCALC 72 12/07/2017 0807      Wt Readings from Last 3 Encounters:  05/28/18 235 lb 4 oz (106.7 kg)  05/26/18 237 lb (107.5 kg)  05/21/18 237 lb 12.8 oz (107.9 kg)     No flowsheet data found.    ASSESSMENT AND PLAN:  1.  PACs: I reviewed the patient  most recent EKG that was read as atrial fibrillation.  P waves were clearly present which excludes atrial fibrillation.  The EKG was consistent with normal sinus rhythm with frequent PACs.  No need for long-term anticoagulation unless the patient develops documented recurrent episodes of atrial fibrillation.  He had only one isolated episode in 2017 after back surgery, Foley catheter placement and mild volume depletion.  I discontinued Eliquis today.  2.  Atypical chest pain and generalized fatigue: I think we have to exclude underlying coronary artery disease.  I requested a treadmill Myoview.  His EKG is mildly abnormal with left anterior fascicular block.  3.  Essential hypertension: Blood pressure is mildly controlled.  However, given the patient's complaint of frequent dizziness  and tendency to develop volume depletion, consider switching his diuretic to a different antihypertensive medication such as amlodipine.  4.  Hyperlipidemia: Currently on simvastatin.    Disposition:   FU with me in 1 year  Signed,  Edward Sacramento, MD  05/28/2018 10:47 AM    [place]

## 2018-06-04 ENCOUNTER — Encounter
Admission: RE | Admit: 2018-06-04 | Discharge: 2018-06-04 | Disposition: A | Discharge status: Home / Self Care | Payer: Medicare Other | Source: Ambulatory Visit | Attending: Cardiovascular Disease | Admitting: Cardiovascular Disease

## 2018-06-04 DIAGNOSIS — R079 Chest pain, unspecified: Secondary | ICD-10-CM | POA: Insufficient documentation

## 2018-06-04 LAB — NM MYOCAR MULTI W/SPECT W/WALL MOTION / EF
CHL CUP MPHR: 145 {beats}/min
CHL CUP NUCLEAR SRS: 7
CSEPHR: 128 %
Estimated workload: 10.1 METS
Exercise duration (min): 8 min
Exercise duration (sec): 0 s
LVDIAVOL: 91 mL (ref 62–150)
LVSYSVOL: 41 mL
Peak HR: 187 {beats}/min
Rest HR: 47 {beats}/min
SDS: 0
SSS: 5
TID: 0.82

## 2018-06-04 MED ORDER — TECHNETIUM TC 99M TETROFOSMIN IV KIT
10.8600 | PACK | Freq: Once | INTRAVENOUS | Status: AC | PRN
  Administered 2018-06-04: 10.86 via INTRAVENOUS

## 2018-06-04 MED ORDER — TECHNETIUM TC 99M TETROFOSMIN IV KIT
29.0470 | PACK | Freq: Once | INTRAVENOUS | Status: AC | PRN
  Administered 2018-06-04: 29.047 via INTRAVENOUS

## 2018-06-07 ENCOUNTER — Other Ambulatory Visit (INDEPENDENT_AMBULATORY_CARE_PROVIDER_SITE_OTHER): Payer: Medicare Other

## 2018-06-07 ENCOUNTER — Encounter: Payer: Self-pay | Admitting: Internal Medicine

## 2018-06-07 DIAGNOSIS — I10 Essential (primary) hypertension: Secondary | ICD-10-CM | POA: Diagnosis not present

## 2018-06-07 DIAGNOSIS — E7800 Pure hypercholesterolemia, unspecified: Secondary | ICD-10-CM

## 2018-06-07 LAB — HEPATIC FUNCTION PANEL
ALK PHOS: 57 U/L (ref 39–117)
ALT: 31 U/L (ref 0–53)
AST: 19 U/L (ref 0–37)
Albumin: 4.1 g/dL (ref 3.5–5.2)
BILIRUBIN DIRECT: 0.2 mg/dL (ref 0.0–0.3)
Total Bilirubin: 0.7 mg/dL (ref 0.2–1.2)
Total Protein: 6.4 g/dL (ref 6.0–8.3)

## 2018-06-07 LAB — BASIC METABOLIC PANEL
BUN: 23 mg/dL (ref 6–23)
CHLORIDE: 104 meq/L (ref 96–112)
CO2: 28 mEq/L (ref 19–32)
CREATININE: 0.93 mg/dL (ref 0.40–1.50)
Calcium: 9.4 mg/dL (ref 8.4–10.5)
GFR: 84.07 mL/min (ref >60.00)
Glucose, Bld: 95 mg/dL (ref 70–99)
POTASSIUM: 4 meq/L (ref 3.5–5.1)
Sodium: 139 mEq/L (ref 135–145)

## 2018-06-07 LAB — LIPID PANEL
CHOL/HDL RATIO: 2
Cholesterol: 116 mg/dL (ref 0–200)
HDL: 46.8 mg/dL (ref >39.00)
LDL CALC: 61 mg/dL (ref 0–99)
NonHDL: 69.3
TRIGLYCERIDES: 41 mg/dL (ref 0.0–149.0)
VLDL: 8.2 mg/dL (ref 0.0–40.0)

## 2018-06-08 ENCOUNTER — Telehealth: Payer: Self-pay | Admitting: Cardiovascular Disease

## 2018-06-08 NOTE — Telephone Encounter (Signed)
Patient wife calling Would like to check on the status of the results for the stress test Please call to discuss

## 2018-06-09 NOTE — Telephone Encounter (Signed)
S/w patient. Let him know that we are waiting for Dr Fletcher Anon to do his final review of the stress test. Let him know the preliminary was low risk but that there may be further recommendations from Dr Fletcher Anon. He verbalized understanding and was appreciative.

## 2018-06-10 ENCOUNTER — Telehealth: Payer: Self-pay | Admitting: Cardiovascular Disease

## 2018-06-10 DIAGNOSIS — I471 Supraventricular tachycardia: Secondary | ICD-10-CM

## 2018-06-10 NOTE — Telephone Encounter (Signed)
Patient wife calling stating she received a message about scheduling an appointment but she is not sure what it was about  She thinks it was about Myoview results  Please advise

## 2018-06-10 NOTE — Telephone Encounter (Signed)
Notes recorded by Wellington Hampshire, MD on 06/09/2018 at 5:08 PM EDT Inform patient that stress test was normal. However, he had SVT during stress test. Arrhythmia might be responsible for some of his symptoms. I recommend a 2-week ZIO patch monitor to evaluate the frequency of this and see if it requires treatment.  I called and spoke with the patient's wife regarding his stress test results (ok per DPR). She is aware the Dr. Tyrell Antonio recommendations to have the patient wear a 2 week ZIO for evaluation of SVT that was seen on his stress nuclear study.  She is agreeable with this and voices understanding.  Per Mrs. Preslar, they will be going out of town in 2 weeks on a fishing trip- 06/26/18. I advised her if the patient will come in tomorrow, then we can place this on him and he will take it off on 10/25 prior to leaving town.  Mrs. Hollomon is agreeable. I have advised her that the patient may come in at 7:30 am and have this placed.

## 2018-06-11 ENCOUNTER — Ambulatory Visit (INDEPENDENT_AMBULATORY_CARE_PROVIDER_SITE_OTHER): Payer: Medicare Other

## 2018-06-11 ENCOUNTER — Other Ambulatory Visit: Payer: Medicare Other

## 2018-06-11 DIAGNOSIS — I471 Supraventricular tachycardia: Secondary | ICD-10-CM | POA: Diagnosis not present

## 2018-06-15 ENCOUNTER — Ambulatory Visit: Payer: Medicare Other | Admitting: Internal Medicine

## 2018-06-15 ENCOUNTER — Encounter: Payer: Self-pay | Admitting: Internal Medicine

## 2018-06-15 ENCOUNTER — Other Ambulatory Visit: Payer: Self-pay | Admitting: Internal Medicine

## 2018-06-15 DIAGNOSIS — I471 Supraventricular tachycardia: Secondary | ICD-10-CM

## 2018-06-15 DIAGNOSIS — E7800 Pure hypercholesterolemia, unspecified: Secondary | ICD-10-CM

## 2018-06-15 DIAGNOSIS — Z23 Encounter for immunization: Secondary | ICD-10-CM | POA: Diagnosis not present

## 2018-06-15 DIAGNOSIS — K219 Gastro-esophageal reflux disease without esophagitis: Secondary | ICD-10-CM

## 2018-06-15 DIAGNOSIS — R531 Weakness: Secondary | ICD-10-CM | POA: Diagnosis not present

## 2018-06-15 DIAGNOSIS — I10 Essential (primary) hypertension: Secondary | ICD-10-CM

## 2018-06-15 DIAGNOSIS — R5383 Other fatigue: Secondary | ICD-10-CM

## 2018-06-15 NOTE — Progress Notes (Signed)
Patient ID: Edward James, male   DOB: 12/25/1940, 77 y.o.   MRN: [id number]   Subjective:    Patient ID: Edward James, male    DOB: 11-25-1940, 77 y.o.   MRN: [id number]  HPI  Patient here for a scheduled follow up.  He is accompanied by his wife.  History obtained from both of them.  Recently evaluated by cardiology.  Stress test revealed SVT with exercise.  Wearing monitor.  Feels better.  Appetite is good.  Some increased burping.  Energy improving.  Has episodes of waking up at night.  Some snoring.  Discussed the possibility of sleep apnea.  Agrees to further w/up.  Trying to stay hydrated.     Past Medical History:  Diagnosis Date  . Atrial fibrillation ([place])   . Diverticulosis   . GERD (gastroesophageal reflux disease)   . Hypercholesterolemia   . Hypertension   . Prostate cancer [facility])    s/p radical retropubic prostatectomy 1999  . Spinal stenosis    Past Surgical History:  Procedure Laterality Date  . FOLEY CATHETER    . INGUINAL HERNIA REPAIR  1945  . INGUINAL HERNIA REPAIR Left 12/04/2015   Procedure: HERNIA REPAIR INGUINAL ADULT;  Surgeon: Leonie Green, MD;  Location: [facility] ORS;  Service: General;  Laterality: Left;  . LUMBAR FUSION     L3-4 fusion  . pilonidal cyst removal  1969  . RETROPUBIC PROSTATECTOMY     radical (1999)  . TONSILLECTOMY AND ADENOIDECTOMY  1948   Family History  Problem Relation Age of Onset  . AAA (abdominal aortic aneurysm) Father   . Nephrolithiasis Unknown   . Colon cancer Neg Hx    Social History   Socioeconomic History  . Marital status: Married    Spouse name: Not on file  . Number of children: 3  . Years of education: Not on file  . Highest education level: Not on file  Occupational History  . Not on file  Social Needs  . Financial resource strain: Not on file  . Food insecurity:    Worry: Not on file    Inability: Not on file  . Transportation needs:    Medical: Not on file    Non-medical: Not on file  Tobacco  Use  . Smoking status: Former Smoker    Last attempt to quit: 11/28/1973    Years since quitting: 44.5  . Smokeless tobacco: Never Used  Substance and Sexual Activity  . Alcohol use: No    Alcohol/week: 0.0 standard drinks    Comment: occasional  . Drug use: No  . Sexual activity: Not on file  Lifestyle  . Physical activity:    Days per week: Not on file    Minutes per session: Not on file  . Stress: Not on file  Relationships  . Social connections:    Talks on phone: Not on file    Gets together: Not on file    Attends religious service: Not on file    Active member of club or organization: Not on file    Attends meetings of clubs or organizations: Not on file    Relationship status: Not on file  Other Topics Concern  . Not on file  Social History Narrative  . Not on file    Outpatient Encounter Medications as of 06/15/2018  Medication Sig  . aspirin 81 MG tablet Take 81 mg by mouth daily.  Marland Kitchen lisinopril (PRINIVIL,ZESTRIL) 20 MG tablet TAKE 1 TABLET BY MOUTH DAILY  .  Multiple Vitamins-Minerals (PRESERVISION AREDS PO) Take 1 tablet by mouth daily.  . Omega-3 Fatty Acids (OMEGA-3 FISH OIL PO) Take 2 capsules by mouth daily. Takes 1 tablet by mouth twice daily.  . pantoprazole (PROTONIX) 40 MG tablet TAKE 1 TABLET BY MOUTH DAILY  . triamcinolone (NASACORT) 55 MCG/ACT AERO nasal inhaler Place 2 sprays into the nose daily as needed (congestion).   . triamterene-hydrochlorothiazide (MAXZIDE-25) 37.5-25 MG tablet TAKE ONE-HALF TABLET BY MOUTH ONCE A DAY (Patient taking differently: Take 0.5 tablets by mouth daily. )  . [DISCONTINUED] simvastatin (ZOCOR) 10 MG tablet TAKE 1 TABLET BY MOUTH DAILY   No facility-administered encounter medications on file as of 06/15/2018.     Review of Systems  Constitutional: Negative for appetite change and unexpected weight change.  HENT: Negative for congestion and sinus pressure.   Respiratory: Negative for cough, chest tightness and shortness  of breath.   Cardiovascular: Negative for chest pain, palpitations and leg swelling.  Gastrointestinal: Negative for abdominal pain, diarrhea and nausea.  Genitourinary: Negative for difficulty urinating and dysuria.  Musculoskeletal: Negative for joint swelling and myalgias.  Skin: Negative for color change and rash.  Neurological: Negative for dizziness, light-headedness and headaches.  Psychiatric/Behavioral: Negative for agitation and dysphoric mood.       Objective:    Physical Exam  Constitutional: He appears well-developed and well-nourished. No distress.  HENT:  Nose: Nose normal.  Mouth/Throat: Oropharynx is clear and moist.  Neck: Neck supple.  Cardiovascular: Normal rate and regular rhythm.  Pulmonary/Chest: Effort normal and breath sounds normal. No respiratory distress.  Abdominal: Soft. Bowel sounds are normal. There is no tenderness.  Musculoskeletal: He exhibits no edema or tenderness.  Lymphadenopathy:    He has no cervical adenopathy.  Skin: No rash noted. No erythema.  Psychiatric: He has a normal mood and affect. His behavior is normal.    BP 134/78 (BP Location: Left Arm, Patient Position: Sitting, Cuff Size: Large)   Pulse (!) 55   Temp 97.7 F (36.5 C) (Oral)   Resp 18   Wt 240 lb 9.6 oz (109.1 kg)   SpO2 99%   BMI 29.29 kg/m  Wt Readings from Last 3 Encounters:  06/15/18 240 lb 9.6 oz (109.1 kg)  05/28/18 235 lb 4 oz (106.7 kg)  05/26/18 237 lb (107.5 kg)     Lab Results  Component Value Date   WBC 9.2 05/26/2018   HGB 15.6 05/26/2018   HCT 45.0 05/26/2018   PLT 246 05/26/2018   GLUCOSE 95 06/07/2018   CHOL 116 06/07/2018   TRIG 41.0 06/07/2018   HDL 46.80 06/07/2018   LDLCALC 61 06/07/2018   ALT 31 06/07/2018   AST 19 06/07/2018   NA 139 06/07/2018   K 4.0 06/07/2018   CL 104 06/07/2018   CREATININE 0.93 06/07/2018   BUN 23 06/07/2018   CO2 28 06/07/2018   TSH 1.08 05/21/2018   PSA 0.09 (L) 12/07/2017   HGBA1C 5.7 05/21/2018      Nm Myocar Multi W/spect W/wall Motion / Ef  Result Date: 06/04/2018  Blood pressure demonstrated a hypertensive response to exercise.  The study is normal.  This is a low risk study.  The left ventricular ejection fraction is normal (55-65%).  The patient had baseline sinus bradycardia with heart rate of 47 bpm. He developed SVT with exercise with a heart rate around 188 bpm. This resolved with vagal maneuvers and recovery. He also had PVCs with exercise and during recovery.  Recommend EP  evaluation for SVT.        Assessment & Plan:   Problem List Items Addressed This Visit    Fatigue    Energy improving.  With some daytime somnolence, snoring, etc.  With SVT.  Concern over possible sleep apnea.  Refer to pulmonary for further evaluation for possible sleep apnea.        Relevant Orders   Ambulatory referral to Pulmonology   Generalized weakness    Improved.  Eating better.  Staying hydrated.  Follow.        GERD (gastroesophageal reflux disease)    Controlled on current regimen.  Some increased gas.  Follow.        Hypercholesterolemia    On simvastatin.  Low cholesterol diet and exercise.  Follow lipid panel and liver function tests.        Hypertension    Blood pressure under good control.  Continue same medication regimen.  Follow pressures.  Follow metabolic panel.        SVT (supraventricular tachycardia) (H[facility])    Found to have SVT on recent stress test.  Wearing monitor.  Being followed by cardiology.         Other Visit Diagnoses    Need for immunization against influenza       Encounter for immunization       Relevant Orders   Flu vaccine HIGH DOSE PF (Completed)       Einar Pheasant, MD

## 2018-06-20 ENCOUNTER — Encounter: Payer: Self-pay | Admitting: Internal Medicine

## 2018-06-20 DIAGNOSIS — I471 Supraventricular tachycardia, unspecified: Secondary | ICD-10-CM | POA: Insufficient documentation

## 2018-06-20 DIAGNOSIS — R5383 Other fatigue: Secondary | ICD-10-CM | POA: Insufficient documentation

## 2018-06-20 NOTE — Assessment & Plan Note (Signed)
Blood pressure under good control.  Continue same medication regimen.  Follow pressures.  Follow metabolic panel.   

## 2018-06-20 NOTE — Assessment & Plan Note (Signed)
On simvastatin.  Low cholesterol diet and exercise.  Follow lipid panel and liver function tests.   

## 2018-06-20 NOTE — Assessment & Plan Note (Signed)
Improved.  Eating better.  Staying hydrated.  Follow.

## 2018-06-20 NOTE — Assessment & Plan Note (Signed)
Found to have SVT on recent stress test.  Wearing monitor.  Being followed by cardiology.

## 2018-06-20 NOTE — Assessment & Plan Note (Signed)
Controlled on current regimen.  Some increased gas.  Follow.

## 2018-06-20 NOTE — Assessment & Plan Note (Signed)
Energy improving.  With some daytime somnolence, snoring, etc.  With SVT.  Concern over possible sleep apnea.  Refer to pulmonary for further evaluation for possible sleep apnea.

## 2018-07-09 ENCOUNTER — Telehealth: Payer: Self-pay | Admitting: *Deleted

## 2018-07-09 DIAGNOSIS — I471 Supraventricular tachycardia: Secondary | ICD-10-CM

## 2018-07-09 NOTE — Telephone Encounter (Signed)
-----   Message from Wellington Hampshire, MD sent at 07/08/2018  1:21 PM EST ----- Inform patient that monitor confirmed recurrent episodes of supraventricular tachycardia.  I recommend referral to Dr. Caryl Comes for evaluation.

## 2018-07-09 NOTE — Telephone Encounter (Signed)
Patient's wife made aware of results, per DPR. Referral has been ordered for Dr. Caryl Comes and message sent to scheduling.

## 2018-07-16 ENCOUNTER — Encounter: Payer: Self-pay | Admitting: Pulmonary Disease

## 2018-07-16 ENCOUNTER — Ambulatory Visit: Payer: Medicare Other | Admitting: Pulmonary Disease

## 2018-07-16 VITALS — BP 142/72 | HR 58 | Ht 76.0 in | Wt 247.6 lb

## 2018-07-16 DIAGNOSIS — R5383 Other fatigue: Secondary | ICD-10-CM | POA: Diagnosis not present

## 2018-07-16 DIAGNOSIS — I471 Supraventricular tachycardia: Secondary | ICD-10-CM | POA: Diagnosis not present

## 2018-07-16 NOTE — Progress Notes (Signed)
Subjective:    Patient ID: Edward James, male    DOB: September 13, 1940, 77 y.o.   MRN: [id number]  HPI The patient is a 77 year old very remote former smoker (less than 5-pack-year history of smoking) who presents for evaluation of fatigue, query sleep apnea.  The patient is kindly referred by Dr. Einar Pheasant.  The patient states that he had been doing well up until approximately 2 months ago when he had an episode of "heat exhaustion" and dehydration.  He felt very weak during this episode and fatigue.  He does not endorse dyspnea per se.  Patient resolved some of the symptoms with volume repletion however some of the symptoms lasted for approximately 2 weeks.  Since then this has resolved.  During this time the patient also saw his dentist and a Panorex was performed and the patient was noted to have sinus disease on the basis of the Panorex.  He was seen by an ENT who recommended starting Nasacort.  He notes that he has been sleeping much better since he started using Nasacort.  The patient's wife notes that he will occasionally and only rarely snore and it is very light.  He does not have apneic episodes that she has observed.  He has not had any snoring since he started using Nasacort.  Sometime during this evaluation.  The patient went to see his primary care group and was told by 1 of the physicians that he had atrial fibrillation and further studies were performed.  He underwent a stress test that showed no ischemia however he has episodes of SVT during the stress test.  The patient was not aware of his SVT.  He is has been referred to EP for this and has an upcoming appointment in January.  The patient has not had shortness of breath, chest pain, orthopnea, paroxysmal nocturnal dyspnea, or lower extremity edema.  We performed an Epworth scale questionnaire and the patient only has an Epworth scale of 5.  During our interview, patient appeared occasionally tearful he appears to be feeling anxious at  times and somewhat depressed particularly having seen those friends die recently.  I query whether this may be adding to his symptoms of fatigue and tiredness.  I have reviewed his past medical and surgical history.  Reviewed his family history.  There are rest noted.  His social history he is married lives with his wife in [place].  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  No combat seen during that time.  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  He is retired from this position.  Review of Systems  Constitutional: Positive for fatigue.  HENT: Positive for congestion and sinus pressure.   Eyes: Negative.   Respiratory: Negative.   Cardiovascular: Negative.   Gastrointestinal: Negative.   Endocrine: Negative.   Genitourinary: Negative.   Musculoskeletal: Negative.   Skin: Negative.   Allergic/Immunologic: Negative.   Neurological: Negative.   Hematological: Negative.   Psychiatric/Behavioral: Negative.   All other systems reviewed and are negative.      Objective:   Physical Exam  Constitutional: He is oriented to person, place, and time. He appears well-developed and well-nourished. No distress.  Occasionally tearful.  HENT:  Head: Normocephalic and atraumatic.  Right Ear: External ear normal.  Left Ear: External ear normal.  Nose: Nose normal.  Mouth/Throat: Oropharynx is clear and  moist.  Eyes: Pupils are equal, round, and reactive to light. Conjunctivae and EOM are normal. No scleral icterus.  Neck: Normal range of motion. Neck supple. No JVD present. No tracheal deviation present. No thyromegaly present.  Cardiovascular: Regular rhythm, normal heart sounds and intact distal pulses. Bradycardia present. Exam reveals no gallop.  No murmur heard. Pulmonary/Chest: Effort normal and breath sounds normal. No respiratory distress.  Abdominal: Soft. He  exhibits no distension.  Musculoskeletal: Normal range of motion. He exhibits no edema.  Lymphadenopathy:    He has no cervical adenopathy.  Neurological: He is alert and oriented to person, place, and time.  No focal deficit.  Skin: Skin is warm and dry. No rash noted. He is not diaphoretic. No erythema. No pallor.  Psychiatric: His speech is normal and behavior is normal. Judgment and thought content normal. His affect is labile.    Epworth sleepiness scale was 5/24, indicating low likelihood of excessive daytime sleepiness.     Assessment & Plan:   1) Fatigue: I cannot ascribe this symptom to pulmonary etiology.  The patient does not have any symptoms or manifestations of potential sleep apnea.  His Epworth scale was very low making the suspicion for sleep apnea less likely.  I query whether this patient is having issues with depression as he has had significant losses of friends who are about his same age and he appears to be fixated on mortality.  He appears somewhat tearful during the interview today making this a likely possibility.  He does admit to episodes of anxiety and feeling restless.  I have advised him to discuss this with his primary care physician.   2) Paroxysmal SVT: The patient does not appear to be aware of changes in his heart rhythm.  I have noted in his chart that he has had a prior episode of atrial fibrillation in 2017 that was worked up thoroughly.  This was self-limiting.  Most recently he was noted by at least 1 physician to be in either atrial flutter versus SVT.  Stress test was GERD and episode of SVT with heart rates in the 188 range.  The patient still did not feel this.  He has episodes of bradycardia underlying bradycardia query whether he may be developing issues with sick sinus syndrome.  He has an upcoming evaluation by EP. SVT can also lead to fatigue.   Thank you for allowing me to participate in this patient's care.  We will see the patient on an  as-needed basis.

## 2018-07-16 NOTE — Patient Instructions (Signed)
Follow up as needed

## 2018-08-04 ENCOUNTER — Ambulatory Visit: Payer: Medicare Other | Admitting: Internal Medicine

## 2018-08-04 ENCOUNTER — Encounter: Payer: Self-pay | Admitting: Internal Medicine

## 2018-08-04 VITALS — BP 130/78 | HR 79 | Temp 97.9°F | Resp 16 | Wt 249.6 lb

## 2018-08-04 DIAGNOSIS — Z8546 Personal history of malignant neoplasm of prostate: Secondary | ICD-10-CM | POA: Diagnosis not present

## 2018-08-04 DIAGNOSIS — Z9109 Other allergy status, other than to drugs and biological substances: Secondary | ICD-10-CM

## 2018-08-04 DIAGNOSIS — I10 Essential (primary) hypertension: Secondary | ICD-10-CM

## 2018-08-04 DIAGNOSIS — Z125 Encounter for screening for malignant neoplasm of prostate: Secondary | ICD-10-CM

## 2018-08-04 DIAGNOSIS — E7800 Pure hypercholesterolemia, unspecified: Secondary | ICD-10-CM

## 2018-08-04 DIAGNOSIS — K219 Gastro-esophageal reflux disease without esophagitis: Secondary | ICD-10-CM | POA: Diagnosis not present

## 2018-08-04 DIAGNOSIS — I471 Supraventricular tachycardia, unspecified: Secondary | ICD-10-CM

## 2018-08-04 LAB — PSA, MEDICARE: PSA: 0.48 ng/mL (ref 0.10–4.00)

## 2018-08-04 NOTE — Assessment & Plan Note (Signed)
Doing well with nasacort nasal spray.  Follow.

## 2018-08-04 NOTE — Assessment & Plan Note (Signed)
On simvastatin.  Low cholesterol diet and exercise.  Follow lipid panel and liver function tests.   

## 2018-08-04 NOTE — Assessment & Plan Note (Signed)
Found to have SVT on recent stress test and holter.  Asymptomatic.  Has appt scheduled with Dr Caryl Comes.

## 2018-08-04 NOTE — Assessment & Plan Note (Signed)
Blood pressure under good control.  Continue same medication regimen.  Follow pressures.  Follow metabolic panel.   

## 2018-08-04 NOTE — Assessment & Plan Note (Signed)
Controlled on current regimen.  Follow.  

## 2018-08-04 NOTE — Assessment & Plan Note (Signed)
Check psa today.

## 2018-08-04 NOTE — Progress Notes (Signed)
Patient ID: Edward James, male   DOB: Mar 10, 1941, 77 y.o.   MRN: [id number]   Subjective:    Patient ID: Edward James, male    DOB: 17-Jun-1941, 77 y.o.   MRN: [id number]  HPI  Patient here for a scheduled follow up.  He is accompanied by his wife.  History obtained from both of them.  Has been evaluated recently by cardiology.  Had stress test that showed no ischemia.  Did have episodes of SVT during stress test and SVT noticed on holter.  Planning to f/u with Dr Caryl Comes.  Reports no notice of increased heart rate or palpitations.  No chest pain.  Recently saw pulmonary for evaluation of possible sleep apnea.  After evaluation, felt no further testing for sleep apnea warranted.  States he is feeling better, both mentally and physically.  No depressions and no increased anxiety.  Feels better.  Eating well.  No acid reflux. No abdominal pain.  Bowels moving.     Past Medical History:  Diagnosis Date  . Atrial fibrillation ([place])   . Diverticulosis   . GERD (gastroesophageal reflux disease)   . Hypercholesterolemia   . Hypertension   . Prostate cancer [facility])    s/p radical retropubic prostatectomy 1999  . Spinal stenosis    Past Surgical History:  Procedure Laterality Date  . FOLEY CATHETER    . INGUINAL HERNIA REPAIR  1945  . INGUINAL HERNIA REPAIR Left 12/04/2015   Procedure: HERNIA REPAIR INGUINAL ADULT;  Surgeon: Leonie Green, MD;  Location: [facility] ORS;  Service: General;  Laterality: Left;  . LUMBAR FUSION     L3-4 fusion  . pilonidal cyst removal  1969  . RETROPUBIC PROSTATECTOMY     radical (1999)  . TONSILLECTOMY AND ADENOIDECTOMY  1948   Family History  Problem Relation Age of Onset  . AAA (abdominal aortic aneurysm) Father   . Nephrolithiasis Unknown   . Colon cancer Neg Hx    Social History   Socioeconomic History  . Marital status: Married    Spouse name: Not on file  . Number of children: 3  . Years of education: Not on file  . Highest education level: Not on  file  Occupational History  . Not on file  Social Needs  . Financial resource strain: Not on file  . Food insecurity:    Worry: Not on file    Inability: Not on file  . Transportation needs:    Medical: Not on file    Non-medical: Not on file  Tobacco Use  . Smoking status: Former Smoker    Last attempt to quit: 11/28/1973    Years since quitting: 44.7  . Smokeless tobacco: Never Used  Substance and Sexual Activity  . Alcohol use: No    Alcohol/week: 0.0 standard drinks    Comment: occasional  . Drug use: No  . Sexual activity: Not on file  Lifestyle  . Physical activity:    Days per week: Not on file    Minutes per session: Not on file  . Stress: Not on file  Relationships  . Social connections:    Talks on phone: Not on file    Gets together: Not on file    Attends religious service: Not on file    Active member of club or organization: Not on file    Attends meetings of clubs or organizations: Not on file    Relationship status: Not on file  Other Topics Concern  . Not on file  Social History Narrative  . Not on file    Outpatient Encounter Medications as of 08/04/2018  Medication Sig  . aspirin 81 MG tablet Take 81 mg by mouth daily.  Marland Kitchen lisinopril (PRINIVIL,ZESTRIL) 20 MG tablet TAKE 1 TABLET BY MOUTH DAILY  . Multiple Vitamins-Minerals (PRESERVISION AREDS PO) Take 1 tablet by mouth daily.  . Omega-3 Fatty Acids (OMEGA-3 FISH OIL PO) Take 2 capsules by mouth daily. Takes 1 tablet by mouth twice daily.  . pantoprazole (PROTONIX) 40 MG tablet TAKE 1 TABLET BY MOUTH DAILY  . simvastatin (ZOCOR) 10 MG tablet TAKE 1 TABLET BY MOUTH DAILY  . triamcinolone (NASACORT) 55 MCG/ACT AERO nasal inhaler Place 2 sprays into the nose daily as needed (congestion).   . triamterene-hydrochlorothiazide (MAXZIDE-25) 37.5-25 MG tablet TAKE ONE-HALF TABLET BY MOUTH ONCE A DAY (Patient taking differently: Take 0.5 tablets by mouth daily. )   No facility-administered encounter  medications on file as of 08/04/2018.     Review of Systems  Constitutional: Negative for appetite change and unexpected weight change.  HENT: Negative for congestion and sinus pressure.   Respiratory: Negative for cough, chest tightness and shortness of breath.   Cardiovascular: Negative for chest pain, palpitations and leg swelling.  Gastrointestinal: Negative for abdominal pain, diarrhea, nausea and vomiting.  Genitourinary: Negative for difficulty urinating and dysuria.  Musculoskeletal: Negative for joint swelling and myalgias.  Skin: Negative for color change and rash.  Neurological: Negative for dizziness, light-headedness and headaches.  Psychiatric/Behavioral: Negative for agitation and dysphoric mood.       Objective:    Physical Exam  Constitutional: He appears well-developed and well-nourished. No distress.  HENT:  Nose: Nose normal.  Mouth/Throat: Oropharynx is clear and moist.  Neck: Neck supple.  Cardiovascular: Normal rate and regular rhythm.  Pulmonary/Chest: Effort normal and breath sounds normal. No respiratory distress.  Abdominal: Soft. Bowel sounds are normal. There is no tenderness.  Musculoskeletal: He exhibits no edema or tenderness.  Lymphadenopathy:    He has no cervical adenopathy.  Skin: No rash noted. No erythema.  Psychiatric: He has a normal mood and affect. His behavior is normal.    BP 130/78 (BP Location: Left Arm, Patient Position: Sitting, Cuff Size: Normal)   Pulse 79   Temp 97.9 F (36.6 C) (Oral)   Resp 16   Wt 249 lb 9.6 oz (113.2 kg)   SpO2 97%   BMI 30.38 kg/m  Wt Readings from Last 3 Encounters:  08/04/18 249 lb 9.6 oz (113.2 kg)  07/16/18 247 lb 9.6 oz (112.3 kg)  06/15/18 240 lb 9.6 oz (109.1 kg)     Lab Results  Component Value Date   WBC 9.2 05/26/2018   HGB 15.6 05/26/2018   HCT 45.0 05/26/2018   PLT 246 05/26/2018   GLUCOSE 95 06/07/2018   CHOL 116 06/07/2018   TRIG 41.0 06/07/2018   HDL 46.80 06/07/2018    LDLCALC 61 06/07/2018   ALT 31 06/07/2018   AST 19 06/07/2018   NA 139 06/07/2018   K 4.0 06/07/2018   CL 104 06/07/2018   CREATININE 0.93 06/07/2018   BUN 23 06/07/2018   CO2 28 06/07/2018   TSH 1.08 05/21/2018   PSA 0.48 08/04/2018   HGBA1C 5.7 05/21/2018    Nm Myocar Multi W/spect W/wall Motion / Ef  Result Date: 06/04/2018  Blood pressure demonstrated a hypertensive response to exercise.  The study is normal.  This is a low risk study.  The left ventricular ejection fraction is  normal (55-65%).  The patient had baseline sinus bradycardia with heart rate of 47 bpm. He developed SVT with exercise with a heart rate around 188 bpm. This resolved with vagal maneuvers and recovery. He also had PVCs with exercise and during recovery.  Recommend EP evaluation for SVT.        Assessment & Plan:   Problem List Items Addressed This Visit    Environmental allergies    Doing well with nasacort nasal spray.  Follow.        GERD (gastroesophageal reflux disease)    Controlled on current regimen.  Follow.        History of prostate cancer    Check psa today.        Hypercholesterolemia    On simvastatin.  Low cholesterol diet and exercise.  Follow lipid panel and liver function tests.        Relevant Orders   Hepatic function panel   Lipid panel   Hypertension    Blood pressure under good control.  Continue same medication regimen.  Follow pressures.  Follow metabolic panel.        Relevant Orders   Hemoglobin P5W   Basic metabolic panel   SVT (supraventricular tachycardia) (HCC)    Found to have SVT on recent stress test and holter.  Asymptomatic.  Has appt scheduled with Dr Caryl Comes.         Other Visit Diagnoses    Prostate cancer screening    -  Primary   Relevant Orders   PSA, Medicare (Completed)   PSA, Medicare       Einar Pheasant, MD

## 2018-08-19 ENCOUNTER — Encounter: Payer: Self-pay | Admitting: Internal Medicine

## 2018-08-19 ENCOUNTER — Ambulatory Visit: Payer: Medicare Other | Admitting: Internal Medicine

## 2018-08-19 VITALS — BP 145/81 | HR 61 | Ht 76.0 in | Wt 246.5 lb

## 2018-08-19 DIAGNOSIS — I491 Atrial premature depolarization: Secondary | ICD-10-CM | POA: Diagnosis not present

## 2018-08-19 DIAGNOSIS — I471 Supraventricular tachycardia: Secondary | ICD-10-CM

## 2018-08-19 DIAGNOSIS — I10 Essential (primary) hypertension: Secondary | ICD-10-CM | POA: Diagnosis not present

## 2018-08-19 NOTE — Progress Notes (Signed)
ELECTROPHYSIOLOGY CONSULT NOTE  Patient ID: Edward James, MRN: [id number], DOB/AGE: 75/19/1942 77 y.o. Admit date: (Not on file) Date of Consult: 08/19/2018  Primary Physician: Einar Pheasant, MD Primary Cardiologist: Edward James     Edward James is a 77 y.o. male who is being seen today for the evaluation of SVT at the request of Edward James.    HPI Edward James is a 77 y.o. male seen because of SVT identified on treadmill testing as well as on event recorder.  He came to medical attention earlier this summer because of episode of weakness which he ascribes to see injury.  It lasted for weeks.  When he saw his primary care doctor he was found to have what was initially interpreted as atrial fibrillation but which was sinus bradycardia rates in the 50s interspersed with sinus rates in the 70s.  He was referred for evaluation.  Dr. Gaylyn Cheers appreciated that it was not atrial fibrillation; anticoagulation was discontinued a Myoview scan was undertaken which demonstrated no ischemia but induced SVT.  Patient had no associated symptoms.  He was then given a ZIO Patch.  He had multiple episodes of abrupt onset offset tachycardia, none of which was associated with symptoms.  He has noted no change in exercise tolerance except the gradual changes over the last 5-10 years.  He works outside vigorously.  Denies chest pain shortness of breath peripheral edema     Event Recorder personnally reviewed  Recurrent episodes of abrupt onset offset tachypalpitations with ( only) modest PR prolongation at the first beat   DATE TEST EF   11/16 Myoview  54 No Ischemia  2  /17 Echo   55-65 %   10/19 Myoview 55-65% No ischemia >>SVT       Past Medical History:  Diagnosis Date  . Atrial fibrillation ([place])   . Diverticulosis   . GERD (gastroesophageal reflux disease)   . Hypercholesterolemia   . Hypertension   . Prostate cancer [facility])    s/p radical retropubic prostatectomy 1999  . Spinal stenosis        Surgical History:  Past Surgical History:  Procedure Laterality Date  . FOLEY CATHETER    . INGUINAL HERNIA REPAIR  1945  . INGUINAL HERNIA REPAIR Left 12/04/2015   Procedure: HERNIA REPAIR INGUINAL ADULT;  Surgeon: Leonie Green, MD;  Location: [facility] ORS;  Service: General;  Laterality: Left;  . LUMBAR FUSION     L3-4 fusion  . pilonidal cyst removal  1969  . RETROPUBIC PROSTATECTOMY     radical (1999)  . TONSILLECTOMY AND ADENOIDECTOMY  1948     Home Meds: Current Meds  Medication Sig  . aspirin 81 MG tablet Take 81 mg by mouth daily.  Marland Kitchen lisinopril (PRINIVIL,ZESTRIL) 20 MG tablet TAKE 1 TABLET BY MOUTH DAILY  . Multiple Vitamins-Minerals (PRESERVISION AREDS PO) Take 1 tablet by mouth daily.  . Omega-3 Fatty Acids (OMEGA-3 FISH OIL PO) Take 2 capsules by mouth daily. Takes 1 tablet by mouth twice daily.  . pantoprazole (PROTONIX) 40 MG tablet TAKE 1 TABLET BY MOUTH DAILY  . simvastatin (ZOCOR) 10 MG tablet TAKE 1 TABLET BY MOUTH DAILY  . triamcinolone (NASACORT) 55 MCG/ACT AERO nasal inhaler Place 2 sprays into the nose daily as needed (congestion).   . triamterene-hydrochlorothiazide (MAXZIDE-25) 37.5-25 MG tablet TAKE ONE-HALF TABLET BY MOUTH ONCE A DAY (Patient taking differently: Take 0.5 tablets by mouth daily. )    Allergies: No Known Allergies  Social History  Socioeconomic History  . Marital status: Married    Spouse name: Not on file  . Number of children: 3  . Years of education: Not on file  . Highest education level: Not on file  Occupational History  . Not on file  Social Needs  . Financial resource strain: Not on file  . Food insecurity:    Worry: Not on file    Inability: Not on file  . Transportation needs:    Medical: Not on file    Non-medical: Not on file  Tobacco Use  . Smoking status: Former Smoker    Last attempt to quit: 11/28/1973    Years since quitting: 44.7  . Smokeless tobacco: Never Used  Substance and Sexual Activity  .  Alcohol use: No    Alcohol/week: 0.0 standard drinks    Comment: occasional  . Drug use: No  . Sexual activity: Not on file  Lifestyle  . Physical activity:    Days per week: Not on file    Minutes per session: Not on file  . Stress: Not on file  Relationships  . Social connections:    Talks on phone: Not on file    Gets together: Not on file    Attends religious service: Not on file    Active member of club or organization: Not on file    Attends meetings of clubs or organizations: Not on file    Relationship status: Not on file  . Intimate partner violence:    Fear of current or ex partner: Not on file    Emotionally abused: Not on file    Physically abused: Not on file    Forced sexual activity: Not on file  Other Topics Concern  . Not on file  Social History Narrative  . Not on file     Family History  Problem Relation Age of Onset  . AAA (abdominal aortic aneurysm) Father   . Nephrolithiasis Unknown   . Colon cancer Neg Hx      ROS:  Please see the history of present illness.     All other systems reviewed and negative.    Physical Exam: Blood pressure (!) 145/81, pulse 61, height 6\' 4"  (1.93 m), weight 246 lb 8 oz (111.8 kg). General: Well developed, well nourished male in no acute distress. Head: Normocephalic, atraumatic, sclera non-icteric, no xanthomas, nares are without discharge. EENT: normal  Lymph Nodes:  none Neck: Negative for carotid bruits. JVD not elevated. Back:without scoliosis kyphosis Lungs: Clear bilaterally to auscultation without wheezes, rales, or rhonchi. Breathing is unlabored. Heart: RRR with S1 S2. No  murmur . No rubs, or gallops appreciated. Abdomen: Soft, non-tender, non-distended with normoactive bowel sounds. No hepatomegaly. No rebound/guarding. No obvious abdominal masses. Msk:  Strength and tone appear normal for age. Extremities: No clubbing or cyanosis. No edema.  Distal pedal pulses are 2+ and equal bilaterally. Skin: Warm  and Dry Neuro: Alert and oriented X 3. CN III-XII intact Grossly normal sensory and motor function . Psych:  Responds to questions appropriately with a normal affect.      Labs: Cardiac Enzymes No results for input(s): CKTOTAL, CKMB, TROPONINI in the last 72 hours. CBC Lab Results  Component Value Date   WBC 9.2 05/26/2018   HGB 15.6 05/26/2018   HCT 45.0 05/26/2018   MCV 86.2 05/26/2018   PLT 246 05/26/2018   PROTIME: No results for input(s): LABPROT, INR in the last 72 hours. Chemistry No results for input(s): NA, K,  CL, CO2, BUN, CREATININE, CALCIUM, PROT, BILITOT, ALKPHOS, ALT, AST, GLUCOSE in the last 168 hours.  Invalid input(s): LABALBU Lipids Lab Results  Component Value Date   CHOL 116 06/07/2018   HDL 46.80 06/07/2018   LDLCALC 61 06/07/2018   TRIG 41.0 06/07/2018   BNP No results found for: PROBNP Thyroid Function Tests: No results for input(s): TSH, T4TOTAL, T3FREE, THYROIDAB in the last 72 hours.  Invalid input(s): FREET3 Miscellaneous No results found for: DDIMER  Radiology/Studies:  No results found.  EKG: 9/19 Sinus Loletha Grayer with non sustained atrial tach 05/28/18 Sinus rhythm   Low volts  O/w normal  GXT ( myoview) personally reviewed, SVT with what appears to be retrograde p waves in the prox ST segment  Assessment and Plan:  SVT  Bradycardia-nocturnal  Probably AV reentry based on onset and offset.  However, the patient has no associated symptoms.  Hence, there is not any indication for therapy.  Medical therapy may be a little bit of a challenge with his history of bradycardia; although mostly it was nocturnal his wife describes heart rates into the 50s during the day.  We discussed the role of catheter ablation but again, the absence of symptoms we will follow along.  We will attentive to things like dizziness, fatigue, shortness of breath etc.   Edward James

## 2018-08-19 NOTE — Patient Instructions (Signed)
Medication Instructions:  - Your physician recommends that you continue on your current medications as directed. Please refer to the Current Medication list given to you today.  If you need a refill on your cardiac medications before your next appointment, please call your pharmacy.   Lab work: - none ordered  If you have labs (blood work) drawn today and your tests are completely normal, you will receive your results only by: Marland Kitchen MyChart Message (if you have MyChart) OR . A paper copy in the mail If you have any lab test that is abnormal or we need to change your treatment, we will call you to review the results.  Testing/Procedures: - none ordered  Follow-Up: At [facility], you and your health needs are our priority.  As part of our continuing mission to provide you with exceptional heart care, we have created designated Provider Care Teams.  These Care Teams include your primary Cardiologist (physician) and Advanced Practice Providers (APPs -  Physician Assistants and Nurse Practitioners) who all work together to provide you with the care you need, when you need it. . You will need a follow up appointment in 6 months with Dr. Caryl Comes.  Please call our office 2 months in advance to schedule this appointment.    Any Other Special Instructions Will Be Listed Below (If Applicable). -N/A

## 2018-08-31 ENCOUNTER — Other Ambulatory Visit: Payer: Self-pay | Admitting: Internal Medicine

## 2018-09-09 ENCOUNTER — Encounter

## 2018-09-09 ENCOUNTER — Ambulatory Visit: Payer: Medicare Other | Admitting: Internal Medicine

## 2018-11-09 ENCOUNTER — Other Ambulatory Visit: Payer: Self-pay | Admitting: Internal Medicine

## 2018-12-13 ENCOUNTER — Other Ambulatory Visit: Payer: Medicare Other

## 2018-12-15 ENCOUNTER — Telehealth: Payer: Self-pay | Admitting: *Deleted

## 2018-12-15 ENCOUNTER — Encounter: Payer: Medicare Other | Admitting: Internal Medicine

## 2018-12-15 ENCOUNTER — Other Ambulatory Visit: Payer: Self-pay | Admitting: Internal Medicine

## 2018-12-15 NOTE — Telephone Encounter (Signed)
I spoke with Edward James today & talked about moving up appt with Dr. Nicki Reaper & lab appt. He will talk with wife & call back to setup both. I mentioned to him that they could have labs done this week and have phone visit with Dr. Nicki Reaper next week. He mentioned that there Internet service isn't good in his area. So we discussed a phone visit.

## 2019-02-09 ENCOUNTER — Telehealth: Payer: Self-pay

## 2019-02-09 NOTE — Telephone Encounter (Signed)
Patient to call and confirm appointment with Dr. Caryl Comes.

## 2019-02-17 ENCOUNTER — Telehealth: Payer: Medicare Other | Admitting: Internal Medicine

## 2019-02-22 ENCOUNTER — Other Ambulatory Visit (INDEPENDENT_AMBULATORY_CARE_PROVIDER_SITE_OTHER): Payer: Medicare Other

## 2019-02-22 ENCOUNTER — Encounter: Payer: Self-pay | Admitting: Internal Medicine

## 2019-02-22 ENCOUNTER — Other Ambulatory Visit: Payer: Self-pay

## 2019-02-22 DIAGNOSIS — I10 Essential (primary) hypertension: Secondary | ICD-10-CM

## 2019-02-22 DIAGNOSIS — E7800 Pure hypercholesterolemia, unspecified: Secondary | ICD-10-CM

## 2019-02-22 DIAGNOSIS — Z125 Encounter for screening for malignant neoplasm of prostate: Secondary | ICD-10-CM

## 2019-02-22 LAB — LIPID PANEL
Cholesterol: 119 mg/dL (ref 0–200)
HDL: 46.9 mg/dL (ref >39.00)
LDL Cholesterol: 61 mg/dL (ref 0–99)
NonHDL: 72.17
Total CHOL/HDL Ratio: 3
Triglycerides: 57 mg/dL (ref 0.0–149.0)
VLDL: 11.4 mg/dL (ref 0.0–40.0)

## 2019-02-22 LAB — BASIC METABOLIC PANEL
BUN: 18 mg/dL (ref 6–23)
CO2: 28 mEq/L (ref 19–32)
Calcium: 9.2 mg/dL (ref 8.4–10.5)
Chloride: 104 mEq/L (ref 96–112)
Creatinine, Ser: 0.86 mg/dL (ref 0.40–1.50)
GFR: 86.41 mL/min (ref >60.00)
Glucose, Bld: 95 mg/dL (ref 70–99)
Potassium: 4.3 mEq/L (ref 3.5–5.1)
Sodium: 138 mEq/L (ref 135–145)

## 2019-02-22 LAB — HEPATIC FUNCTION PANEL
ALT: 27 U/L (ref 0–53)
AST: 25 U/L (ref 0–37)
Albumin: 4.2 g/dL (ref 3.5–5.2)
Alkaline Phosphatase: 59 U/L (ref 39–117)
Bilirubin, Direct: 0.1 mg/dL (ref 0.0–0.3)
Total Bilirubin: 0.6 mg/dL (ref 0.2–1.2)
Total Protein: 6.3 g/dL (ref 6.0–8.3)

## 2019-02-22 LAB — PSA, MEDICARE: PSA: 0.01 ng/ml — ABNORMAL LOW (ref 0.10–4.00)

## 2019-02-22 LAB — HEMOGLOBIN A1C: Hgb A1c MFr Bld: 5.6 % (ref 4.6–6.5)

## 2019-02-25 ENCOUNTER — Other Ambulatory Visit: Payer: Self-pay

## 2019-02-25 ENCOUNTER — Ambulatory Visit (INDEPENDENT_AMBULATORY_CARE_PROVIDER_SITE_OTHER): Payer: Medicare Other | Admitting: Internal Medicine

## 2019-02-25 ENCOUNTER — Encounter: Payer: Self-pay | Admitting: Internal Medicine

## 2019-02-25 VITALS — BP 126/72 | HR 66 | Temp 98.4°F | Resp 16 | Wt 239.0 lb

## 2019-02-25 DIAGNOSIS — K219 Gastro-esophageal reflux disease without esophagitis: Secondary | ICD-10-CM

## 2019-02-25 DIAGNOSIS — R739 Hyperglycemia, unspecified: Secondary | ICD-10-CM

## 2019-02-25 DIAGNOSIS — I10 Essential (primary) hypertension: Secondary | ICD-10-CM

## 2019-02-25 DIAGNOSIS — Z0000 Encounter for general adult medical examination without abnormal findings: Secondary | ICD-10-CM

## 2019-02-25 DIAGNOSIS — Z9109 Other allergy status, other than to drugs and biological substances: Secondary | ICD-10-CM | POA: Diagnosis not present

## 2019-02-25 DIAGNOSIS — Z8546 Personal history of malignant neoplasm of prostate: Secondary | ICD-10-CM

## 2019-02-25 DIAGNOSIS — E7800 Pure hypercholesterolemia, unspecified: Secondary | ICD-10-CM

## 2019-02-25 DIAGNOSIS — I471 Supraventricular tachycardia: Secondary | ICD-10-CM | POA: Diagnosis not present

## 2019-02-25 MED ORDER — PANTOPRAZOLE SODIUM 40 MG PO TBEC: 40.0000 mg | DELAYED_RELEASE_TABLET | Freq: Every day | ORAL | 3 refills | Status: DC

## 2019-02-25 NOTE — Progress Notes (Signed)
Patient ID: Edward James, male   DOB: [birth year], 78 y.o.   MRN: [id number]   Subjective:    Patient ID: Edward James, male    DOB: 1941-07-26, 78 y.o.   MRN: [id number]  HPI  Patient here for his physical exam.  States he is doing relatively well.  Has seen Dr Caryl Comes for f/u SVT.  Elected to follow secondary to absence of symptoms.  He tries to stay active.  No chest pain.  No sob.  He reports he is due f/u with Dr Caryl Comes.  Would like to schedule.  No acid reflux.  No abdominal pain.  Bowels moving.  Some increased stress.  Discussed with him today.  Does not feel needs anything more at this time.  Blood pressure has been doing well.     Past Medical History:  Diagnosis Date  . Atrial fibrillation ([place])   . Diverticulosis   . GERD (gastroesophageal reflux disease)   . Hypercholesterolemia   . Hypertension   . Prostate cancer [facility])    s/p radical retropubic prostatectomy 1999  . Spinal stenosis    Past Surgical History:  Procedure Laterality Date  . FOLEY CATHETER    . INGUINAL HERNIA REPAIR  1945  . INGUINAL HERNIA REPAIR Left 12/04/2015   Procedure: HERNIA REPAIR INGUINAL ADULT;  Surgeon: Leonie Green, MD;  Location: [facility] ORS;  Service: General;  Laterality: Left;  . LUMBAR FUSION     L3-4 fusion  . pilonidal cyst removal  1969  . RETROPUBIC PROSTATECTOMY     radical (1999)  . TONSILLECTOMY AND ADENOIDECTOMY  1948   Family History  Problem Relation Age of Onset  . AAA (abdominal aortic aneurysm) Father   . Nephrolithiasis Other   . Colon cancer Neg Hx    Social History   Socioeconomic History  . Marital status: Married    Spouse name: Not on file  . Number of children: 3  . Years of education: Not on file  . Highest education level: Not on file  Occupational History  . Not on file  Social Needs  . Financial resource strain: Not on file  . Food insecurity    Worry: Not on file    Inability: Not on file  . Transportation needs    Medical: Not on file   Non-medical: Not on file  Tobacco Use  . Smoking status: Former Smoker    Quit date: 11/28/1973    Years since quitting: 45.2  . Smokeless tobacco: Never Used  Substance and Sexual Activity  . Alcohol use: No    Alcohol/week: 0.0 standard drinks    Comment: occasional  . Drug use: No  . Sexual activity: Not on file  Lifestyle  . Physical activity    Days per week: Not on file    Minutes per session: Not on file  . Stress: Not on file  Relationships  . Social Herbalist on phone: Not on file    Gets together: Not on file    Attends religious service: Not on file    Active member of club or organization: Not on file    Attends meetings of clubs or organizations: Not on file    Relationship status: Not on file  Other Topics Concern  . Not on file  Social History Narrative  . Not on file    Outpatient Encounter Medications as of 02/25/2019  Medication Sig  . aspirin 81 MG tablet Take 81 mg by mouth daily.  Marland Kitchen  lisinopril (PRINIVIL,ZESTRIL) 20 MG tablet TAKE 1 TABLET BY MOUTH DAILY  . Multiple Vitamins-Minerals (PRESERVISION AREDS PO) Take 1 tablet by mouth daily.  . Omega-3 Fatty Acids (OMEGA-3 FISH OIL PO) Take 2 capsules by mouth daily. Takes 1 tablet by mouth twice daily.  . pantoprazole (PROTONIX) 40 MG tablet Take 1 tablet (40 mg total) by mouth daily.  . simvastatin (ZOCOR) 10 MG tablet TAKE 1 TABLET BY MOUTH DAILY  . triamcinolone (NASACORT) 55 MCG/ACT AERO nasal inhaler Place 2 sprays into the nose daily as needed (congestion).   . triamterene-hydrochlorothiazide (MAXZIDE-25) 37.5-25 MG tablet TAKE ONE-HALF TABLET BY MOUTH ONCE A DAY  . [DISCONTINUED] pantoprazole (PROTONIX) 40 MG tablet TAKE 1 TABLET BY MOUTH DAILY   No facility-administered encounter medications on file as of 02/25/2019.     Review of Systems  Constitutional: Negative for appetite change and unexpected weight change.  HENT: Negative for congestion and sinus pressure.   Eyes: Negative for  pain and visual disturbance.  Respiratory: Negative for cough, chest tightness and shortness of breath.   Cardiovascular: Negative for chest pain, palpitations and leg swelling.  Gastrointestinal: Negative for abdominal pain, diarrhea, nausea and vomiting.  Genitourinary: Negative for difficulty urinating and dysuria.  Musculoskeletal: Negative for joint swelling and myalgias.  Skin: Negative for color change and rash.  Neurological: Negative for dizziness, light-headedness and headaches.  Hematological: Negative for adenopathy. Does not bruise/bleed easily.  Psychiatric/Behavioral: Negative for agitation and dysphoric mood.       Objective:    Physical Exam Constitutional:      General: He is not in acute distress.    Appearance: Normal appearance. He is well-developed.  HENT:     Head: Normocephalic and atraumatic.     Right Ear: External ear normal.     Left Ear: External ear normal.  Eyes:     General:        Right eye: No discharge.        Left eye: No discharge.     Conjunctiva/sclera: Conjunctivae normal.  Neck:     Musculoskeletal: Neck supple. No muscular tenderness.     Thyroid: No thyromegaly.  Cardiovascular:     Rate and Rhythm: Normal rate and regular rhythm.  Pulmonary:     Effort: No respiratory distress.     Breath sounds: Normal breath sounds. No wheezing.  Abdominal:     General: Bowel sounds are normal.     Palpations: Abdomen is soft.     Tenderness: There is no abdominal tenderness.  Genitourinary:    Comments: Not performed.   Musculoskeletal:        General: No tenderness.  Lymphadenopathy:     Cervical: No cervical adenopathy.  Skin:    Findings: No erythema or rash.  Neurological:     Mental Status: He is alert and oriented to person, place, and time.  Psychiatric:        Mood and Affect: Mood normal.        Behavior: Behavior normal.     BP 126/72   Pulse 66   Temp 98.4 F (36.9 C) (Oral)   Resp 16   Wt 239 lb (108.4 kg)   SpO2  97%   BMI 29.09 kg/m  Wt Readings from Last 3 Encounters:  02/25/19 239 lb (108.4 kg)  08/19/18 246 lb 8 oz (111.8 kg)  08/04/18 249 lb 9.6 oz (113.2 kg)     Lab Results  Component Value Date   WBC 9.2 05/26/2018  HGB 15.6 05/26/2018   HCT 45.0 05/26/2018   PLT 246 05/26/2018   GLUCOSE 95 02/22/2019   CHOL 119 02/22/2019   TRIG 57.0 02/22/2019   HDL 46.90 02/22/2019   LDLCALC 61 02/22/2019   ALT 27 02/22/2019   AST 25 02/22/2019   NA 138 02/22/2019   K 4.3 02/22/2019   CL 104 02/22/2019   CREATININE 0.86 02/22/2019   BUN 18 02/22/2019   CO2 28 02/22/2019   TSH 1.08 05/21/2018   PSA 0.01 (L) 02/22/2019   HGBA1C 5.6 02/22/2019    Nm Myocar Multi W/spect W/wall Motion / Ef  Result Date: 06/04/2018  Blood pressure demonstrated a hypertensive response to exercise.  The study is normal.  This is a low risk study.  The left ventricular ejection fraction is normal (55-65%).  The patient had baseline sinus bradycardia with heart rate of 47 bpm. He developed SVT with exercise with a heart rate around 188 bpm. This resolved with vagal maneuvers and recovery. He also had PVCs with exercise and during recovery.  Recommend EP evaluation for SVT.        Assessment & Plan:   Problem List Items Addressed This Visit    Environmental allergies    Symptoms controlled.        GERD (gastroesophageal reflux disease)    Controlled on current medication regimen.  Follow.        Relevant Medications   pantoprazole (PROTONIX) 40 MG tablet   History of prostate cancer    PSA 02/22/19 - .01.        Relevant Orders   PSA, Medicare   Hypercholesterolemia    On simvastatin.  Low cholesterol diet and exercise.  Follow lipid panel and liver function tests.        Relevant Orders   Hepatic function panel   Lipid panel   Hypertension    Blood pressure doing well.  Continue current medication regimen.  Follow pressures.  Follow metabolic panel.        Relevant Orders   CBC  with Differential/Platelet   Basic metabolic panel   SVT (supraventricular tachycardia) ([place]) - Primary    Had recent stress test and holter.  Diagnosed with SVT.  Has seen Dr Caryl Comes.  States due f/u. Was to call in June to schedule.  EKG obtained today.  EKG - SR, no acute ischemic changes.  Keep f/u with Dr Caryl Comes.        Relevant Orders   EKG 12-Lead (Completed)   TSH    Other Visit Diagnoses    Hyperglycemia       Relevant Orders   Hemoglobin A1c       Einar Pheasant, MD

## 2019-02-25 NOTE — Assessment & Plan Note (Signed)
Physical today 02/25/19. PSA 02/22/19 - .01.  Colonoscopy 04/11/14.

## 2019-02-27 ENCOUNTER — Encounter: Payer: Self-pay | Admitting: Internal Medicine

## 2019-02-27 NOTE — Assessment & Plan Note (Signed)
On simvastatin.  Low cholesterol diet and exercise.  Follow lipid panel and liver function tests.   

## 2019-02-27 NOTE — Assessment & Plan Note (Signed)
Controlled on current medication regimen.  Follow.   

## 2019-02-27 NOTE — Assessment & Plan Note (Signed)
Symptoms controlled

## 2019-02-27 NOTE — Assessment & Plan Note (Signed)
Had recent stress test and holter.  Diagnosed with SVT.  Has seen Dr Caryl Comes.  States due f/u. Was to call in June to schedule.  EKG obtained today.  EKG - SR, no acute ischemic changes.  Keep f/u with Dr Caryl Comes.

## 2019-02-27 NOTE — Assessment & Plan Note (Signed)
Blood pressure doing well.  Continue current medication regimen.  Follow pressures.  Follow metabolic panel.  

## 2019-02-27 NOTE — Assessment & Plan Note (Signed)
PSA 02/22/19 - .01.

## 2019-03-02 ENCOUNTER — Encounter: Payer: Self-pay | Admitting: Internal Medicine

## 2019-03-03 NOTE — Telephone Encounter (Signed)
Notify pt that someone will be contacting him with his appt with Dr Caryl Comes.  Yes, he needs stool cards.  Tell him sorry for the inconvenience.    Dr Nicki Reaper

## 2019-03-23 ENCOUNTER — Other Ambulatory Visit (INDEPENDENT_AMBULATORY_CARE_PROVIDER_SITE_OTHER): Payer: Medicare Other

## 2019-03-23 ENCOUNTER — Telehealth: Payer: Self-pay | Admitting: *Deleted

## 2019-03-23 DIAGNOSIS — Z1211 Encounter for screening for malignant neoplasm of colon: Secondary | ICD-10-CM

## 2019-03-23 NOTE — Telephone Encounter (Signed)
Placed ifob order

## 2019-03-24 ENCOUNTER — Telehealth: Payer: Self-pay

## 2019-03-24 LAB — FECAL OCCULT BLOOD, IMMUNOCHEMICAL: Fecal Occult Bld: POSITIVE — AB

## 2019-03-24 NOTE — Telephone Encounter (Signed)
[facility] called. Pt's ifob positive for blood

## 2019-03-25 ENCOUNTER — Encounter: Payer: Self-pay | Admitting: Internal Medicine

## 2019-03-25 DIAGNOSIS — R195 Other fecal abnormalities: Secondary | ICD-10-CM

## 2019-03-25 NOTE — Telephone Encounter (Signed)
Order placed for GI referral.   

## 2019-03-25 NOTE — Telephone Encounter (Signed)
Patient returning call from [place]. Please advise and call back.

## 2019-03-25 NOTE — Telephone Encounter (Signed)
Please call and notify pt that his stool test was positive for blood.  I would like to refer him to GI for further evaluation.  Let me know which gastroenterologist he prefers to see.

## 2019-03-25 NOTE — Telephone Encounter (Signed)
See my chart message

## 2019-04-11 ENCOUNTER — Encounter: Payer: Self-pay | Admitting: Internal Medicine

## 2019-04-11 NOTE — Telephone Encounter (Signed)
Called patient to get more info. Pt did not hit his head. Was only out for a few seconds. No confusion, dizziness, SOB, etc upon waking up. No significant acute issues at this time. Stated he just feels a little tired. Has not called cardiology yet. Advised that ideally, he should be evaluated at acute care since he did pass out. Patient did not want to go to [facility] or ED. Advised he will call cardiology first thing in the morning. Patient is going to send message or call with update in the morning.

## 2019-04-12 ENCOUNTER — Telehealth: Payer: Self-pay | Admitting: Internal Medicine

## 2019-04-12 NOTE — Telephone Encounter (Signed)
Pt c/o Syncope: STAT if syncope occurred within 30 minutes and pt complains of lightheadedness High Priority if episode of passing out, completely, today or in last 24 hours   1. Did you pass out today? No   2. When is the last time you passed out? Saturday   3. Has this occurred multiple times? No   4. Did you have any symptoms prior to passing out? Felt woozy standing at a game told daughter he needed to sit down slid down to the side of vehicle denies fall or injury

## 2019-04-12 NOTE — Telephone Encounter (Signed)
Pt reports LOC after spending a day outside working and watching a ball game. Did not eat that day.   Pt worked in the am outside tearing down a shed. Drank 2 bottles of water and a gatorade. Then went to watch grandson play baseball. He thought he would be able to get something to eat and drink there but wasn't able to get into park d/t social distancing and felt dizzy, woozy and walked to car. Slumped down against car. Did not fall, denies injury. Denies chest pain or SOB. No similar sx since. This is first reported syncopal episode.   Vitals that day when he got home were 135/71, HR 80s-120s with apple watch. Vitals normally are BP 120s-160s over 65-75, HR 60-100.   Pt reports after rest and eating sx improved. Prior to this event, he felt tachy palpitations 2 weeks ago which he says are exacerbated by heat. Pt called PCP who referred him to call Dr. Caryl Comes.   Pt had an appt in early sept, he is past due for 6 mo check up. I moved up appt to aug 20th, next available.   I told patient to limit strenuous activities outdoors until we hear back from Dr. Caryl Comes. Pt agreed.   Routed to Dr. Caryl Comes to further advise.

## 2019-04-15 NOTE — Telephone Encounter (Signed)
Based on the patient's description, that seems to be vasovagal syncope with a component of volume depletion.  Follow-up on August 20 should be fine.  If he develops recurrent episodes or similar symptoms, he should seek medical attention during the episode.

## 2019-04-15 NOTE — Telephone Encounter (Signed)
Reached out to patient to check on him. He reports no dizziness, weakness or presyncope. HR 55-65. BP at baseline.   I told patient I will reach out to him when I hear back from Dr. Caryl Comes.

## 2019-04-18 NOTE — Telephone Encounter (Signed)
DPR on file with ok to leave detailed message on the patient's home phone. lmom with Dr.Arida's recommendation below. Patient can contact the office if question or concerns.

## 2019-04-21 ENCOUNTER — Encounter: Payer: Self-pay | Admitting: Internal Medicine

## 2019-04-21 ENCOUNTER — Other Ambulatory Visit: Payer: Self-pay

## 2019-04-21 ENCOUNTER — Ambulatory Visit (INDEPENDENT_AMBULATORY_CARE_PROVIDER_SITE_OTHER): Payer: Medicare Other | Admitting: Internal Medicine

## 2019-04-21 VITALS — BP 145/84 | HR 78 | Ht 76.0 in | Wt 230.0 lb

## 2019-04-21 DIAGNOSIS — R001 Bradycardia, unspecified: Secondary | ICD-10-CM | POA: Diagnosis not present

## 2019-04-21 DIAGNOSIS — I471 Supraventricular tachycardia: Secondary | ICD-10-CM | POA: Diagnosis not present

## 2019-04-21 MED ORDER — DILTIAZEM HCL ER COATED BEADS 120 MG PO CP24: 120.0000 mg | ORAL_CAPSULE | Freq: Every day | ORAL | 3 refills | Status: DC

## 2019-04-21 NOTE — Patient Instructions (Addendum)
Medication Instructions:  - Your physician has recommended you make the following change in your medication:   1) Stop zesteril (lisinopril)  2) Start cardizem (diltiazem) 120 mg- take 1 capsule by mouth once daily  If you need a refill on your cardiac medications before your next appointment, please call your pharmacy.   Lab work: - none ordered  If you have labs (blood work) drawn today and your tests are completely normal, you will receive your results only by: Marland Kitchen MyChart Message (if you have MyChart) OR . A paper copy in the mail If you have any lab test that is abnormal or we need to change your treatment, we will call you to review the results.  Testing/Procedures: - none ordered  Follow-Up: At [facility], you and your health needs are our priority.  As part of our continuing mission to provide you with exceptional heart care, we have created designated Provider Care Teams.  These Care Teams include your primary Cardiologist (physician) and Advanced Practice Providers (APPs -  Physician Assistants and Nurse Practitioners) who all work together to provide you with the care you need, when you need it. Marland Kitchen as scheduled  Any Other Special Instructions Will Be Listed Below (If Applicable). - N/A

## 2019-04-21 NOTE — Progress Notes (Signed)
Patient Care Team: Einar Pheasant, MD as PCP - General (Internal Medicine)   HPI  Edward James is a 78 y.o. male is seen as an add-on today because of a recent episode of syncope.  Previously seen 12/19 for SVT identified on a monitor.  Frequent episodes were noted.  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  No associated symptoms and so no therapy was indicated.  He describes an episode in March of becoming quite anxious while he was standing accompanied by lightheadedness.  No associated palpitations.  He has noted this anxiety before and since.  A couple of weeks ago he was out working in the yard in the heat.  A number of hours.  Went to [employer] to watch a baseball game with his son and was sitting in the center field grass.  Became anxious again and somewhat lightheaded.  No palpitations.  Walked to his car with his daughter when he passed out.  Unconscious for a couple of minutes.  Residual fatigue and orthostatic intolerance.  His heart rate monitor which he wears on his watch detected a heart rate of 160.  Notably, this is the rate of his SVT as detected previously.   Records and Results Reviewed   Past Medical History:  Diagnosis Date  . Atrial fibrillation ([place])   . Diverticulosis   . GERD (gastroesophageal reflux disease)   . Hypercholesterolemia   . Hypertension   . Prostate cancer [facility])    s/p radical retropubic prostatectomy 1999  . Spinal stenosis     Past Surgical History:  Procedure Laterality Date  . FOLEY CATHETER    . INGUINAL HERNIA REPAIR  1945  . INGUINAL HERNIA REPAIR Left 12/04/2015   Procedure: HERNIA REPAIR INGUINAL ADULT;  Surgeon: Leonie Green, MD;  Location: [facility] ORS;  Service: General;  Laterality: Left;  . LUMBAR FUSION     L3-4 fusion  . pilonidal cyst removal  1969  . RETROPUBIC PROSTATECTOMY     radical (1999)  . TONSILLECTOMY AND ADENOIDECTOMY  1948    Current Meds  Medication Sig  . aspirin 81 MG tablet Take 81 mg by mouth daily.  Marland Kitchen lisinopril  (PRINIVIL,ZESTRIL) 20 MG tablet TAKE 1 TABLET BY MOUTH DAILY  . Multiple Vitamins-Minerals (PRESERVISION AREDS PO) Take 1 tablet by mouth daily.  . Omega-3 Fatty Acids (OMEGA-3 FISH OIL PO) Take 2 capsules by mouth daily. Takes 1 tablet by mouth twice daily.  . pantoprazole (PROTONIX) 40 MG tablet Take 1 tablet (40 mg total) by mouth daily.  . simvastatin (ZOCOR) 10 MG tablet TAKE 1 TABLET BY MOUTH DAILY  . triamcinolone (NASACORT) 55 MCG/ACT AERO nasal inhaler Place 2 sprays into the nose daily as needed (congestion).   . triamterene-hydrochlorothiazide (MAXZIDE-25) 37.5-25 MG tablet TAKE ONE-HALF TABLET BY MOUTH ONCE A DAY    No Known Allergies    Review of Systems negative except from HPI and PMH  Physical Exam BP (!) 145/84 (BP Location: Left Arm, Patient Position: Sitting, Cuff Size: Normal)   Pulse 78   Ht 6\' 4"  (1.93 m)   Wt 230 lb (104.3 kg)   BMI 28.00 kg/m  Well developed and nourished in no acute distress HENT normal Neck supple with JVP-  flat   Clear Regular rate and rhythm, no murmurs or gallops Abd-soft with active BS No Clubbing cyanosis edema Skin-warm and dry A & Oriented  Grossly normal sensory and motor function  ECG sinus at 78 Interval 20/11/40 Axis left -55 PVCs left bundle  inferior axis -Frequent  Event recorder was reviewed from 2018.  PVC burden was less than 1%  Assessment and  Plan SVT  Bradycardia-nocturnal  Syncope  Hypertension  PVCs  Anxiety   His syncopal event is concerning in light of his known SVT especially given the heart rate detected on his monitor near the time of his syncope.  The episode of anxiety 3/20 might also been related to his SVT.  Anxiety and SVTs are commonly confused.  He now has freq  PVC; these are unlikely to have triggered syncope.  He could have had ventricular tachycardia in the right ventricular outflow tract as these PVCs suggest that is in origin.  He has nocturnal bradycardia but no daytime  bradycardia.  Hence, we will discontinue his ACE inhibitor and put him on diltiazem 120 mg in the hopes of being able to a control his blood pressure and be control his tachycardia and hopefully thereby his syncope.  I did offer him catheter ablation which he was reluctant to pursue at this point, my thinking being that the 160 on his monitor strongly implicates SVT as a least concomitant and possibly a trigger.  Heart rate of 160 is not 1 that he should otherwise have achieved at his age and it being unassociated with exercise.  We will need to keep track of his PVC burden.     Current medicines are reviewed at length with the patient today .  The patient does not  have concerns regarding medicines.

## 2019-04-21 NOTE — Progress Notes (Signed)
Error

## 2019-04-22 ENCOUNTER — Telehealth: Payer: Self-pay | Admitting: *Deleted

## 2019-04-22 NOTE — Telephone Encounter (Signed)
   [facility] Pre-operative Risk Assessment    Request for surgical clearance:  1. What type of surgery is being performed? COLONOSCOPY/ENDOSCOPY   2. When is this surgery scheduled? TBD   3. What type of clearance is required (medical clearance vs. Pharmacy clearance to hold med vs. Both)? MEDICAL  4. Are there any medications that need to be held prior to surgery and how long? ASA   5. Practice name and name of physician performing surgery? [facility]; DR. Oletta Lamas   6. What is your office phone number [phone]    7.   What is your office fax number [phone]  8.   Anesthesia type (None, local, MAC, general) ? PROPOFOL    Julaine Hua 04/22/2019, 11:29 AM  _________________________________________________________________   (provider comments below)

## 2019-04-23 NOTE — Telephone Encounter (Signed)
At acceptable risk for colonoscopy

## 2019-04-25 ENCOUNTER — Other Ambulatory Visit: Payer: Self-pay | Admitting: Gastroenterology

## 2019-04-25 NOTE — Telephone Encounter (Signed)
   Primary Cardiologist: Kathlyn Sacramento, MD Primary electrophysiologist: Jolyn Nap, MD  Chart reviewed as part of pre-operative protocol coverage.  Edward James was last seen on 04/21/19 by Dr. Caryl Comes.  Per Dr. Caryl Comes, he is at acceptable risk to proceed with procedure. Surgeon may hold ASA 5-7 days prior to EGD.  Therefore, based on ACC/AHA guidelines, the patient would be at acceptable risk for the planned procedure without further cardiovascular testing.   I will route this recommendation to the requesting party via Epic fax function and remove from pre-op pool.  Please call with questions.  Tami Lin Chevon Fomby, PA 04/25/2019, 7:38 AM

## 2019-04-25 NOTE — Telephone Encounter (Signed)
Left detailed message.   

## 2019-05-05 ENCOUNTER — Ambulatory Visit (INDEPENDENT_AMBULATORY_CARE_PROVIDER_SITE_OTHER): Payer: Medicare Other | Admitting: Internal Medicine

## 2019-05-05 ENCOUNTER — Other Ambulatory Visit: Payer: Self-pay

## 2019-05-05 VITALS — BP 148/88 | HR 58 | Ht 76.0 in | Wt 230.0 lb

## 2019-05-05 DIAGNOSIS — I491 Atrial premature depolarization: Secondary | ICD-10-CM

## 2019-05-05 DIAGNOSIS — R001 Bradycardia, unspecified: Secondary | ICD-10-CM | POA: Diagnosis not present

## 2019-05-05 DIAGNOSIS — I471 Supraventricular tachycardia: Secondary | ICD-10-CM | POA: Diagnosis not present

## 2019-05-05 DIAGNOSIS — I480 Paroxysmal atrial fibrillation: Secondary | ICD-10-CM | POA: Diagnosis not present

## 2019-05-05 NOTE — Patient Instructions (Signed)
Medication Instructions:  - Your physician recommends that you continue on your current medications as directed. Please refer to the Current Medication list given to you today.  If you need a refill on your cardiac medications before your next appointment, please call your pharmacy.   Lab work: - none ordered  If you have labs (blood work) drawn today and your tests are completely normal, you will receive your results only by: Marland Kitchen MyChart Message (if you have MyChart) OR . A paper copy in the mail If you have any lab test that is abnormal or we need to change your treatment, we will call you to review the results.  Testing/Procedures: - none ordered  Follow-Up: At [facility] you and your health needs are our priority.  As part of our continuing mission to provide you with exceptional heart care, we have created designated Provider Care Teams.  These Care Teams include your primary Cardiologist (physician) and Advanced Practice Providers (APPs -  Physician Assistants and Nurse Practitioners) who all work together to provide you with the care you need, when you need it.  . You will need a follow up appointment in 6 months (March 2021) with Dr. Caryl Comes. Marland Kitchen Please call our office 2 months in advance to schedule this appointment.  (Call in early January 2021 to schedule).   Any Other Special Instructions Will Be Listed Below (If Applicable). - N/A

## 2019-05-05 NOTE — Progress Notes (Signed)
Patient Care Team: Einar Pheasant, MD as PCP - General (Internal Medicine) Deboraha Sprang, MD as PCP - Electrophysiology (Cardiology) Wellington Hampshire, MD as PCP - Cardiology (Cardiology)   HPI  Edward James is a 78 y.o. male is seen in followup for syncope and previously identified SVT, frequently occurring and thought to AVNRT. Not assoc  Previously seen 12/19 for SVT identified on a monitor.  Frequent episodes were noted.  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  No associated symptoms and so no therapy was indicated.  He describes an episode in March of becoming quite anxious while he was standing accompanied by lightheadedness.  No associated palpitations.  He has noted this anxiety before and since.  A couple of weeks ago he was out working in the yard in the heat.  A number of hours.  Went to [employer] to watch a baseball game with his son and was sitting in the center field grass.  Became anxious again and somewhat lightheaded.  No palpitations.  Walked to his car with his daughter when he passed out.  Unconscious for a couple of minutes.  Residual fatigue and orthostatic intolerance.  His heart rate monitor which he wears on his watch detected a heart rate of 160.  Notably, this is the rate of his SVT as detected previously.  NO interval episodes feels better on the diltiazem.  Numerous questions about what we talked about last week  Blood pressures at home have been in the 120-140 range   Records and Results Reviewed   Past Medical History:  Diagnosis Date  . Atrial fibrillation ([place])   . Diverticulosis   . GERD (gastroesophageal reflux disease)   . Hypercholesterolemia   . Hypertension   . Prostate cancer [facility])    s/p radical retropubic prostatectomy 1999  . Spinal stenosis     Past Surgical History:  Procedure Laterality Date  . FOLEY CATHETER    . INGUINAL HERNIA REPAIR  1945  . INGUINAL HERNIA REPAIR Left 12/04/2015   Procedure: HERNIA REPAIR INGUINAL ADULT;  Surgeon: Leonie Green, MD;  Location: [facility] ORS;  Service: General;  Laterality: Left;  . LUMBAR FUSION     L3-4 fusion  . pilonidal cyst removal  1969  . RETROPUBIC PROSTATECTOMY     radical (1999)  . TONSILLECTOMY AND ADENOIDECTOMY  1948    No outpatient medications have been marked as taking for the 05/05/19 encounter (Office Visit) with Deboraha Sprang, MD.    No Known Allergies    Review of Systems negative except from HPI and PMH  Physical Exam BP (!) 148/88 (BP Location: Left Arm, Patient Position: Sitting, Cuff Size: Normal)   Pulse (!) 58   Ht 6\' 4"  (1.93 m)   Wt 230 lb (104.3 kg)   SpO2 99%   BMI 28.00 kg/m  Well developed and nourished in no acute distress HENT normal Neck supple with JVP-  flat *  Clear Regular rate and rhythm, no murmurs or gallops Abd-soft with active BS No Clubbing cyanosis edema Skin-warm and dry A & Oriented  Grossly normal sensory and motor function  ECG sinus rhythm at 58 Interval [birth year]  Assessment and  Plan SVT  Bradycardia-nocturnal  Syncope  Hypertension  PVCs  Anxiety    .  No intercurrent SVT of which we are aware by his watch or symptoms.  We will continue diltiazem.  Reviewed again discussions regarding catheter ablation as an alternative in the concern that his syncopal episode was  associated with a heart rate of 0000000, almost certainly his SVT.  We reviewed that he has a prodrome sufficient to allow himself to sit down to protect himself.  Encouraged him to do so if necessary regardless of the situation.  Blood pressure is borderline.  He may benefit from up titration of his diltiazem.  Heart rate may be limiting.  We spent more than 50% of our >25 min visit in face to face counseling regarding the above      Current medicines are reviewed at length with the patient today .  The patient does not  have concerns regarding medicines.

## 2019-05-16 ENCOUNTER — Other Ambulatory Visit (HOSPITAL_COMMUNITY)
Admission: RE | Admit: 2019-05-16 | Discharge: 2019-05-16 | Disposition: A | Discharge status: Home / Self Care | Payer: Medicare Other | Source: Ambulatory Visit | Attending: Gastroenterology | Admitting: Gastroenterology

## 2019-05-16 DIAGNOSIS — Z20828 Contact with and (suspected) exposure to other viral communicable diseases: Secondary | ICD-10-CM | POA: Insufficient documentation

## 2019-05-16 DIAGNOSIS — Z01812 Encounter for preprocedural laboratory examination: Secondary | ICD-10-CM | POA: Insufficient documentation

## 2019-05-17 ENCOUNTER — Encounter (HOSPITAL_COMMUNITY): Payer: Self-pay | Admitting: *Deleted

## 2019-05-17 ENCOUNTER — Other Ambulatory Visit: Payer: Self-pay | Admitting: Internal Medicine

## 2019-05-17 ENCOUNTER — Other Ambulatory Visit: Payer: Self-pay

## 2019-05-17 LAB — NOVEL CORONAVIRUS, NAA (HOSP ORDER, SEND-OUT TO REF LAB; TAT 18-24 HRS): SARS-CoV-2, NAA: NOT DETECTED

## 2019-05-18 NOTE — Anesthesia Preprocedure Evaluation (Addendum)
Anesthesia Evaluation  Patient identified by MRN, date of birth, ID band Patient awake    Reviewed: Allergy & Precautions, NPO status , Patient's Chart, lab work & pertinent test results  Airway Mallampati: II  TM Distance: >3 FB Neck ROM: Full    Dental no notable dental hx. (+) Teeth Intact   Pulmonary neg pulmonary ROS, former smoker,    Pulmonary exam normal breath sounds clear to auscultation       Cardiovascular hypertension, Pt. on medications Normal cardiovascular exam Rhythm:Regular Rate:Normal     Neuro/Psych negative neurological ROS  negative psych ROS   GI/Hepatic Neg liver ROS, GERD  ,  Endo/Other  negative endocrine ROS  Renal/GU negative Renal ROS     Musculoskeletal negative musculoskeletal ROS (+)   Abdominal   Peds  Hematology negative hematology ROS (+)   Anesthesia Other Findings   Reproductive/Obstetrics                            Anesthesia Physical Anesthesia Plan  ASA: II  Anesthesia Plan: MAC   Post-op Pain Management:    Induction: Intravenous  PONV Risk Score and Plan: Treatment may vary due to age or medical condition  Airway Management Planned: Natural Airway and Nasal Cannula  Additional Equipment:   Intra-op Plan:   Post-operative Plan:   Informed Consent: I have reviewed the patients History and Physical, chart, labs and discussed the procedure including the risks, benefits and alternatives for the proposed anesthesia with the patient or authorized representative who has indicated his/her understanding and acceptance.     Dental advisory given  Plan Discussed with: CRNA  Anesthesia Plan Comments:        Anesthesia Quick Evaluation

## 2019-05-19 ENCOUNTER — Ambulatory Visit (HOSPITAL_COMMUNITY)
Admission: RE | Admit: 2019-05-19 | Discharge: 2019-05-19 | Disposition: A | Discharge status: Home / Self Care | Payer: Medicare Other | Attending: Gastroenterology | Admitting: Gastroenterology

## 2019-05-19 ENCOUNTER — Encounter (HOSPITAL_COMMUNITY)
Admission: RE | Disposition: A | Discharge status: Home / Self Care | Payer: Self-pay | Source: Home / Self Care | Attending: Gastroenterology

## 2019-05-19 ENCOUNTER — Ambulatory Visit (HOSPITAL_COMMUNITY): Payer: Medicare Other | Admitting: Anesthesiology

## 2019-05-19 ENCOUNTER — Encounter (HOSPITAL_COMMUNITY): Payer: Self-pay | Admitting: Certified Registered Nurse Anesthetist

## 2019-05-19 DIAGNOSIS — E7800 Pure hypercholesterolemia, unspecified: Secondary | ICD-10-CM | POA: Insufficient documentation

## 2019-05-19 DIAGNOSIS — Z87891 Personal history of nicotine dependence: Secondary | ICD-10-CM | POA: Insufficient documentation

## 2019-05-19 DIAGNOSIS — K3189 Other diseases of stomach and duodenum: Secondary | ICD-10-CM | POA: Insufficient documentation

## 2019-05-19 DIAGNOSIS — K317 Polyp of stomach and duodenum: Secondary | ICD-10-CM | POA: Diagnosis not present

## 2019-05-19 DIAGNOSIS — Z79899 Other long term (current) drug therapy: Secondary | ICD-10-CM | POA: Insufficient documentation

## 2019-05-19 DIAGNOSIS — I4891 Unspecified atrial fibrillation: Secondary | ICD-10-CM | POA: Diagnosis not present

## 2019-05-19 DIAGNOSIS — I10 Essential (primary) hypertension: Secondary | ICD-10-CM | POA: Insufficient documentation

## 2019-05-19 DIAGNOSIS — K219 Gastro-esophageal reflux disease without esophagitis: Secondary | ICD-10-CM | POA: Diagnosis not present

## 2019-05-19 DIAGNOSIS — K640 First degree hemorrhoids: Secondary | ICD-10-CM | POA: Diagnosis not present

## 2019-05-19 DIAGNOSIS — K635 Polyp of colon: Secondary | ICD-10-CM | POA: Diagnosis not present

## 2019-05-19 DIAGNOSIS — Z8546 Personal history of malignant neoplasm of prostate: Secondary | ICD-10-CM | POA: Insufficient documentation

## 2019-05-19 DIAGNOSIS — Z7982 Long term (current) use of aspirin: Secondary | ICD-10-CM | POA: Insufficient documentation

## 2019-05-19 DIAGNOSIS — R140 Abdominal distension (gaseous): Secondary | ICD-10-CM | POA: Diagnosis not present

## 2019-05-19 DIAGNOSIS — K449 Diaphragmatic hernia without obstruction or gangrene: Secondary | ICD-10-CM | POA: Diagnosis not present

## 2019-05-19 DIAGNOSIS — R195 Other fecal abnormalities: Secondary | ICD-10-CM | POA: Diagnosis present

## 2019-05-19 HISTORY — PX: ESOPHAGOGASTRODUODENOSCOPY (EGD) WITH PROPOFOL: SHX5813

## 2019-05-19 HISTORY — PX: BIOPSY: SHX5522

## 2019-05-19 HISTORY — PX: POLYPECTOMY: SHX5525

## 2019-05-19 HISTORY — DX: Cardiac arrhythmia, unspecified: I49.9

## 2019-05-19 HISTORY — PX: COLONOSCOPY WITH PROPOFOL: SHX5780

## 2019-05-19 SURGERY — COLONOSCOPY WITH PROPOFOL
Anesthesia: Monitor Anesthesia Care

## 2019-05-19 MED ORDER — LACTATED RINGERS IV SOLN
INTRAVENOUS | Status: DC
  Administered 2019-05-19: 09:00:00 via INTRAVENOUS

## 2019-05-19 MED ORDER — SODIUM CHLORIDE 0.9 % IV SOLN: INTRAVENOUS | Status: DC

## 2019-05-19 MED ORDER — PROPOFOL 500 MG/50ML IV EMUL
INTRAVENOUS | Status: DC | PRN
  Administered 2019-05-19: 30 mg via INTRAVENOUS

## 2019-05-19 MED ORDER — PROPOFOL 500 MG/50ML IV EMUL
INTRAVENOUS | Status: DC | PRN
  Administered 2019-05-19: 150 ug/kg/min via INTRAVENOUS

## 2019-05-19 MED ORDER — LACTATED RINGERS IV SOLN
INTRAVENOUS | Status: DC
  Administered 2019-05-19: 1000 mL via INTRAVENOUS

## 2019-05-19 SURGICAL SUPPLY — 25 items

## 2019-05-19 NOTE — Op Note (Signed)
[facility] Patient Name: Edward James Procedure Date: 05/19/2019 MRN: [id number] Attending MD: Nancy Fetter Dr., MD Date of Birth: [birth year] CSN: [id number] Age: 78 Admit Type: Outpatient Procedure:                Colonoscopy with polypectomy Indications:              Heme positive stool Providers:                Jeneen Rinks L. Dortha Neighbors Dr., MD, Glori Bickers, RN, Marguerita Merles, Technician Referring MD:              Medicines:                Monitored Anesthesia Care Complications:            No immediate complications. Estimated Blood Loss:     Estimated blood loss: none. Procedure:                Pre-Anesthesia Assessment:                           - Prior to the procedure, a History and Physical                            was performed, and patient medications and                            allergies were reviewed. The patient's tolerance of                            previous anesthesia was also reviewed. The risks                            and benefits of the procedure and the sedation                            options and risks were discussed with the patient.                            All questions were answered, and informed consent                            was obtained. Prior Anticoagulants: The patient has                            taken no previous anticoagulant or antiplatelet                            agents except for aspirin. ASA Grade Assessment: II                            - A patient with mild systemic disease. After  reviewing the risks and benefits, the patient was                            deemed in satisfactory condition to undergo the                            procedure.                           - Prior to the procedure, a History and Physical                            was performed, and patient medications and                            allergies were reviewed. The patient's  tolerance of                            previous anesthesia was also reviewed. The risks                            and benefits of the procedure and the sedation                            options and risks were discussed with the patient.                            All questions were answered, and informed consent                            was obtained. Prior Anticoagulants: The patient has                            taken no previous anticoagulant or antiplatelet                            agents except for aspirin. ASA Grade Assessment: II                            - A patient with mild systemic disease. After                            reviewing the risks and benefits, the patient was                            deemed in satisfactory condition to undergo the                            procedure.                           After obtaining informed consent, the colonoscope  was passed under direct vision. Throughout the                            procedure, the patient's blood pressure, pulse, and                            oxygen saturations were monitored continuously. The                            CF-HQ190L ([id number]) Olympus colonoscope was                            introduced through the anus and advanced to the the                            cecum, identified by appendiceal orifice and                            ileocecal valve. The colonoscopy was technically                            difficult and complex due to significant looping.                            Successful completion of the procedure was aided by                            applying abdominal pressure. The quality of the                            bowel preparation was good. The ileocecal valve,                            appendiceal orifice, and rectum were photographed. Scope In: 9:35:46 AM Scope Out: 10:08:44 AM Scope Withdrawal Time: 0 hours 15 minutes 13 seconds  Total Procedure  Duration: 0 hours 32 minutes 58 seconds  Findings:      The perianal and digital rectal examinations were normal.      A 5 mm polyp was found in the descending colon. The polyp was       pedunculated. The polyp was removed with a hot snare. Resection and       retrieval were complete. The pathology specimen was placed into Bottle       Number 3.      Non-bleeding internal hemorrhoids were found. The hemorrhoids were       medium-sized and Grade I (internal hemorrhoids that do not prolapse). Impression:               - One 5 mm polyp in the descending colon, removed                            with a hot snare. Resected and retrieved.                           - Non-bleeding internal hemorrhoids. Moderate Sedation:  MAC by anesthesia Recommendation:           - Patient has a contact number available for                            emergencies. The signs and symptoms of potential                            delayed complications were discussed with the                            patient. Return to normal activities tomorrow.                            Written discharge instructions were provided to the                            patient.                           - Resume previous diet.                           - Continue present medications.                           - Repeat colonoscopy for surveillance based on                            pathology results.                           - Await pathology results.                           - No aspirin, ibuprofen, naproxen, or other                            non-steroidal anti-inflammatory drugs for 5 days                            after polyp removal. Procedure Code(s):        --- Professional ---                           [phone], Colonoscopy, flexible; with removal of                            tumor(s), polyp(s), or other lesion(s) by snare                            technique Diagnosis Code(s):        --- Professional ---                            K63.5, Polyp of colon  R19.5, Other fecal abnormalities                           K64.0, First degree hemorrhoids CPT copyright 2019 American Medical Association. All rights reserved. The codes documented in this report are preliminary and upon coder review may  be revised to meet current compliance requirements. Nancy Fetter Dr., MD 05/19/2019 10:23:26 AM This report has been signed electronically. Number of Addenda: 0

## 2019-05-19 NOTE — Transfer of Care (Signed)
Immediate Anesthesia Transfer of Care Note  Patient: Edward James  Procedure(s) Performed: COLONOSCOPY WITH PROPOFOL (N/A ) ESOPHAGOGASTRODUODENOSCOPY (EGD) WITH PROPOFOL (N/A ) BIOPSY POLYPECTOMY  Patient Location: PACU  Anesthesia Type:MAC  Level of Consciousness: sedated, drowsy and patient cooperative  Airway & Oxygen Therapy: Patient Spontanous Breathing and Patient connected to nasal cannula oxygen  Post-op Assessment: Report given to RN and Post -op Vital signs reviewed and stable  Post vital signs: Reviewed and stable  Last Vitals:  Vitals Value Taken Time  BP    Temp    Pulse    Resp    SpO2      Last Pain:  Vitals:   05/19/19 0835  TempSrc: Oral  PainSc: 0-No pain         Complications: No apparent anesthesia complications

## 2019-05-19 NOTE — Discharge Instructions (Signed)
YOU HAD AN ENDOSCOPIC PROCEDURE TODAY: Refer to the procedure report and other information in the discharge instructions given to you for any specific questions about what was found during the examination. If this information does not answer your questions, please call [facility] office at [phone] to clarify.   YOU SHOULD EXPECT: Some feelings of bloating in the abdomen. Passage of more gas than usual. Walking can help get rid of the air that was put into your GI tract during the procedure and reduce the bloating. If you had a lower endoscopy (such as a colonoscopy or flexible sigmoidoscopy) you may notice spotting of blood in your stool or on the toilet paper. Some abdominal soreness may be present for a day or two, also.  DIET: Your first meal following the procedure should be a light meal and then it is ok to progress to your normal diet. A half-sandwich or bowl of soup is an example of a good first meal. Heavy or fried foods are harder to digest and may make you feel nauseous or bloated. Drink plenty of fluids but you should avoid alcoholic beverages for 24 hours. If you had a esophageal dilation, please see attached instructions for diet.   ACTIVITY: Your care partner should take you home directly after the procedure. You should plan to take it easy, moving slowly for the rest of the day. You can resume normal activity the day after the procedure however YOU SHOULD NOT DRIVE, use power tools, machinery or perform tasks that involve climbing or major physical exertion for 24 hours (because of the sedation medicines used during the test).   SYMPTOMS TO REPORT IMMEDIATELY: A gastroenterologist can be reached at any hour. Please call [phone]  for any of the following symptoms:   Following lower endoscopy (colonoscopy, flexible sigmoidoscopy) Excessive amounts of blood in the stool  Significant tenderness, worsening of abdominal pains  Swelling of the abdomen that is new, acute  Fever of 100  or higher   Following upper endoscopy (EGD, EUS, ERCP, esophageal dilation) Vomiting of blood or coffee ground material  New, significant abdominal pain  New, significant chest pain or pain under the shoulder blades  Painful or persistently difficult swallowing  New shortness of breath  Black, tarry-looking or red, bloody stools  FOLLOW UP:  If any biopsies were taken you will be contacted by phone or by letter within the next 1-3 weeks. Call [phone]  if you have not heard about the biopsies in 3 weeks.  Please also call with any specific questions about appointments or follow up tests.    No aspirin, ibuprofen or other NSAID medications for 5 days. Office will send note or call when pathology results are obtained. Colonoscopy will be repeated based on the pathology results.

## 2019-05-19 NOTE — Anesthesia Postprocedure Evaluation (Signed)
Anesthesia Post Note  Patient: Edward James  Procedure(s) Performed: COLONOSCOPY WITH PROPOFOL (N/A ) ESOPHAGOGASTRODUODENOSCOPY (EGD) WITH PROPOFOL (N/A ) BIOPSY POLYPECTOMY     Patient location during evaluation: Endoscopy Anesthesia Type: MAC Level of consciousness: awake and alert Pain management: pain level controlled Vital Signs Assessment: post-procedure vital signs reviewed and stable Respiratory status: spontaneous breathing, nonlabored ventilation, respiratory function stable and patient connected to nasal cannula oxygen Cardiovascular status: blood pressure returned to baseline and stable Postop Assessment: no apparent nausea or vomiting Anesthetic complications: no    Last Vitals:  Vitals:   05/19/19 1020 05/19/19 1030  BP: 112/72 (!) 123/50  Pulse: (!) 49 (!) 45  Resp: 11 10  Temp:    SpO2: 96% 100%    Last Pain:  Vitals:   05/19/19 1030  TempSrc:   PainSc: 0-No pain                 Barnet Glasgow

## 2019-05-19 NOTE — H&P (Signed)
Subjective:   Patient is a 78 y.o. male presents with . Procedure including risks and benefits discussed in office. He has blood in stool and bloating  Patient Active Problem List   Diagnosis Date Noted  . SVT (supraventricular tachycardia) ([place]) 06/20/2018  . Fatigue 06/20/2018  . Generalized weakness 05/22/2018  . Inguinal hernia 12/03/2015  . Atrial fibrillation ([place]) 10/09/2015  . Environmental allergies 05/13/2015  . Gas 05/13/2015  . Back skin lesion 11/06/2014  . Health care maintenance 11/06/2014  . Back pain 08/12/2014  . Hypertension 08/20/2012  . Hypercholesterolemia 08/20/2012  . GERD (gastroesophageal reflux disease) 08/20/2012  . History of prostate cancer 08/20/2012   Past Medical History:  Diagnosis Date  . Atrial fibrillation ([place])   . Diverticulosis   . Dysrhythmia   . GERD (gastroesophageal reflux disease)   . Hypercholesterolemia   . Hypertension   . Prostate cancer [facility])    s/p radical retropubic prostatectomy 1999  . Spinal stenosis     Past Surgical History:  Procedure Laterality Date  . FOLEY CATHETER    . INGUINAL HERNIA REPAIR  1945  . INGUINAL HERNIA REPAIR Left 12/04/2015   Procedure: HERNIA REPAIR INGUINAL ADULT;  Surgeon: Leonie Green, MD;  Location: [facility] ORS;  Service: General;  Laterality: Left;  . LUMBAR FUSION     L3-4 fusion  . pilonidal cyst removal  1969  . RETROPUBIC PROSTATECTOMY     radical (1999)  . TONSILLECTOMY AND ADENOIDECTOMY  1948    Medications Prior to Admission  Medication Sig Dispense Refill Last Dose  . aspirin 81 MG tablet Take 81 mg by mouth at bedtime.    Past Week at Unknown time  . diltiazem (CARDIZEM CD) 120 MG 24 hr capsule Take 1 capsule (120 mg total) by mouth daily. 90 capsule 3 05/19/2019 at 0300  . Multiple Vitamins-Minerals (PRESERVISION AREDS PO) Take 1 tablet by mouth daily.   05/18/2019 at Unknown time  . Omega-3 Fatty Acids (FISH OIL) 1200 MG CAPS Take 1,200 mg by mouth 2 (two) times daily.    05/18/2019 at Unknown time  . pantoprazole (PROTONIX) 40 MG tablet Take 1 tablet (40 mg total) by mouth daily. 90 tablet 3 05/19/2019 at 0300  . Probiotic Product (ALIGN) 4 MG CAPS Take 4 mg by mouth every evening.   05/18/2019 at Unknown time  . simvastatin (ZOCOR) 10 MG tablet TAKE 1 TABLET BY MOUTH DAILY (Patient taking differently: Take 10 mg by mouth every evening. ) 90 tablet 1 Past Week at Unknown time  . triamcinolone (NASACORT) 55 MCG/ACT AERO nasal inhaler Place 2 sprays into the nose at bedtime.    05/18/2019 at Unknown time  . triamterene-hydrochlorothiazide (MAXZIDE-25) 37.5-25 MG tablet TAKE ONE-HALF TABLET BY MOUTH ONCE A DAY 45 tablet 1 05/18/2019 at Unknown time   No Known Allergies  Social History   Tobacco Use  . Smoking status: Former Smoker    Quit date: 11/28/1973    Years since quitting: 45.5  . Smokeless tobacco: Never Used  Substance Use Topics  . Alcohol use: Yes    Alcohol/week: 0.0 standard drinks    Comment: rarely    Family History  Problem Relation Age of Onset  . AAA (abdominal aortic aneurysm) Father   . Nephrolithiasis Other   . Colon cancer Neg Hx      Objective:   Patient Vitals for the past 8 hrs:  BP Temp Temp src Pulse Resp SpO2 Height Weight  05/19/19 0835 (!) 159/81 98.1 F (  36.7 C) Oral (!) 58 14 99 % 6\' 4"  (1.93 m) 104.3 kg   No intake/output data recorded. No intake/output data recorded.   See MD Preop evaluation      Assessment:   1. Blood in stool  Plan:   EGD and colonoscopy

## 2019-05-19 NOTE — Op Note (Signed)
[facility] Patient Name: Edward James Procedure Date: 05/19/2019 MRN: [id number] Attending MD: Nancy Fetter Dr., MD Date of Birth: 01/29/1941 CSN: [id number] Age: 78 Admit Type: Outpatient Procedure:                Upper GI endoscopy with Biopsy Indications:              Heme positive stool, Abdominal bloating Providers:                Jeneen Rinks L. Donaldo Teegarden Dr., MD, Glori Bickers, RN, Marguerita Merles, Technician Referring MD:              Medicines:                See the other procedure note for documentation of                            the administered medications Complications:            No immediate complications. Estimated Blood Loss:     Estimated blood loss: none. Procedure:                Pre-Anesthesia Assessment:                           - Prior to the procedure, a History and Physical                            was performed, and patient medications and                            allergies were reviewed. The patient's tolerance of                            previous anesthesia was also reviewed. The risks                            and benefits of the procedure and the sedation                            options and risks were discussed with the patient.                            All questions were answered, and informed consent                            was obtained. Prior Anticoagulants: The patient has                            taken no previous anticoagulant or antiplatelet                            agents except for aspirin. ASA Grade Assessment: II                            -  A patient with mild systemic disease. After                            reviewing the risks and benefits, the patient was                            deemed in satisfactory condition to undergo the                            procedure.                           After obtaining informed consent, the endoscope was                            passed  under direct vision. Throughout the                            procedure, the patient's blood pressure, pulse, and                            oxygen saturations were monitored continuously. The                            [id number] Olympus gastroscope was                            introduced through the mouth, and advanced to the                            second part of duodenum. The upper GI endoscopy was                            accomplished without difficulty. The patient                            tolerated the procedure well. Scope In: Scope Out: Findings:      The lumen of the esophagus was mildly dilated.      A medium-sized hiatal hernia was present.      Widely patent GE junction      Multiple 2 to 5 mm sessile polyps with no bleeding were found in the       entire examined stomach. Biopsies were taken with a cold forceps for       histology. The pathology specimen was placed into Bottle Number 2.      Many 2 to 4 mm nodules with a localized distribution were found in the       duodenal bulb. Biopsies were taken with a cold forceps for histology.       The pathology specimen was placed into Bottle Number 1. Impression:               - Dilation in the entire esophagus.                           - Medium-sized hiatal hernia.                           -  Multiple gastric polyps. Biopsied.                           - Nodule found in the duodenum. Biopsied. Moderate Sedation:      See anesthesia note, no moderate sedation. Recommendation:           - Patient has a contact number available for                            emergencies. The signs and symptoms of potential                            delayed complications were discussed with the                            patient. Return to normal activities tomorrow.                            Written discharge instructions were provided to the                            patient.                           - Resume previous  diet.                           - Continue present medications.                           - Await pathology results.                           - Await pathology results. Procedure Code(s):        --- Professional ---                           [phone], Esophagogastroduodenoscopy, flexible,                            transoral; with biopsy, single or multiple Diagnosis Code(s):        --- Professional ---                           K31.89, Other diseases of stomach and duodenum                           K31.7, Polyp of stomach and duodenum                           R19.5, Other fecal abnormalities                           R14.0, Abdominal distension (gaseous) CPT copyright 2019 American Medical Association. All rights reserved. The codes documented in this report are preliminary and upon coder review may  be revised to meet current compliance requirements. Nancy Fetter Dr., MD 05/19/2019 10:17:31  AM This report has been signed electronically. Number of Addenda: 0

## 2019-05-20 ENCOUNTER — Encounter (HOSPITAL_COMMUNITY): Payer: Self-pay | Admitting: Gastroenterology

## 2019-05-20 LAB — SURGICAL PATHOLOGY

## 2019-06-15 ENCOUNTER — Other Ambulatory Visit: Payer: Self-pay | Admitting: Internal Medicine

## 2019-06-20 ENCOUNTER — Other Ambulatory Visit: Payer: Self-pay

## 2019-06-20 ENCOUNTER — Ambulatory Visit (INDEPENDENT_AMBULATORY_CARE_PROVIDER_SITE_OTHER): Payer: Medicare Other

## 2019-06-20 DIAGNOSIS — Z23 Encounter for immunization: Secondary | ICD-10-CM | POA: Diagnosis not present

## 2019-08-31 ENCOUNTER — Other Ambulatory Visit: Payer: Self-pay

## 2019-09-05 ENCOUNTER — Telehealth: Payer: Self-pay | Admitting: Internal Medicine

## 2019-09-05 NOTE — Telephone Encounter (Signed)
Pt wife called wanting to get a copy of pt charges for PSA on 02/22/2019. Please advise and Thank you!  Call wife @ [phone].

## 2019-09-05 NOTE — Telephone Encounter (Signed)
err

## 2019-09-06 ENCOUNTER — Other Ambulatory Visit: Payer: Self-pay

## 2019-09-06 ENCOUNTER — Other Ambulatory Visit (INDEPENDENT_AMBULATORY_CARE_PROVIDER_SITE_OTHER): Payer: Medicare PPO

## 2019-09-06 DIAGNOSIS — I10 Essential (primary) hypertension: Secondary | ICD-10-CM

## 2019-09-06 DIAGNOSIS — E7800 Pure hypercholesterolemia, unspecified: Secondary | ICD-10-CM

## 2019-09-06 DIAGNOSIS — Z8546 Personal history of malignant neoplasm of prostate: Secondary | ICD-10-CM

## 2019-09-06 DIAGNOSIS — I471 Supraventricular tachycardia: Secondary | ICD-10-CM | POA: Diagnosis not present

## 2019-09-06 DIAGNOSIS — R739 Hyperglycemia, unspecified: Secondary | ICD-10-CM | POA: Diagnosis not present

## 2019-09-06 LAB — BASIC METABOLIC PANEL
BUN: 17 mg/dL (ref 6–23)
CO2: 30 mEq/L (ref 19–32)
Calcium: 9.6 mg/dL (ref 8.4–10.5)
Chloride: 105 mEq/L (ref 96–112)
Creatinine, Ser: 0.86 mg/dL (ref 0.40–1.50)
GFR: 86.29 mL/min (ref >60.00)
Glucose, Bld: 97 mg/dL (ref 70–99)
Potassium: 4.1 mEq/L (ref 3.5–5.1)
Sodium: 142 mEq/L (ref 135–145)

## 2019-09-06 LAB — CBC WITH DIFFERENTIAL/PLATELET
Basophils Absolute: 0 10*3/uL (ref 0.0–0.1)
Basophils Relative: 0.5 % (ref 0.0–3.0)
Eosinophils Absolute: 0.1 10*3/uL (ref 0.0–0.7)
Eosinophils Relative: 1.9 % (ref 0.0–5.0)
HCT: 45.7 % (ref 39.0–52.0)
Hemoglobin: 15.1 g/dL (ref 13.0–17.0)
Lymphocytes Relative: 38.3 % (ref 12.0–46.0)
Lymphs Abs: 2.8 10*3/uL (ref 0.7–4.0)
MCHC: 33.1 g/dL (ref 30.0–36.0)
MCV: 87.2 fl (ref 78.0–100.0)
Monocytes Absolute: 0.7 10*3/uL (ref 0.1–1.0)
Monocytes Relative: 9 % (ref 3.0–12.0)
Neutro Abs: 3.7 10*3/uL (ref 1.4–7.7)
Neutrophils Relative %: 50.3 % (ref 43.0–77.0)
Platelets: 233 10*3/uL (ref 150.0–400.0)
RBC: 5.24 Mil/uL (ref 4.22–5.81)
RDW: 13.3 % (ref 11.5–15.5)
WBC: 7.3 10*3/uL (ref 4.0–10.5)

## 2019-09-06 LAB — LIPID PANEL
Cholesterol: 167 mg/dL (ref 0–200)
HDL: 53.3 mg/dL (ref >39.00)
LDL Cholesterol: 99 mg/dL (ref 0–99)
NonHDL: 113.41
Total CHOL/HDL Ratio: 3
Triglycerides: 73 mg/dL (ref 0.0–149.0)
VLDL: 14.6 mg/dL (ref 0.0–40.0)

## 2019-09-06 LAB — HEPATIC FUNCTION PANEL
ALT: 24 U/L (ref 0–53)
AST: 19 U/L (ref 0–37)
Albumin: 4.4 g/dL (ref 3.5–5.2)
Alkaline Phosphatase: 68 U/L (ref 39–117)
Bilirubin, Direct: 0.1 mg/dL (ref 0.0–0.3)
Total Bilirubin: 0.7 mg/dL (ref 0.2–1.2)
Total Protein: 6.8 g/dL (ref 6.0–8.3)

## 2019-09-06 LAB — TSH: TSH: 2.48 u[IU]/mL (ref 0.35–4.50)

## 2019-09-06 LAB — PSA, MEDICARE: PSA: 0.02 ng/ml — ABNORMAL LOW (ref 0.10–4.00)

## 2019-09-06 LAB — HEMOGLOBIN A1C: Hgb A1c MFr Bld: 5.5 % (ref 4.6–6.5)

## 2019-09-07 NOTE — Telephone Encounter (Signed)
Pt aware.

## 2019-09-07 NOTE — Telephone Encounter (Signed)
She will need to call billing [phone].

## 2019-09-08 ENCOUNTER — Ambulatory Visit (INDEPENDENT_AMBULATORY_CARE_PROVIDER_SITE_OTHER): Payer: Medicare PPO | Admitting: Internal Medicine

## 2019-09-08 ENCOUNTER — Other Ambulatory Visit: Payer: Self-pay

## 2019-09-08 ENCOUNTER — Encounter: Payer: Self-pay | Admitting: Internal Medicine

## 2019-09-08 DIAGNOSIS — M545 Low back pain, unspecified: Secondary | ICD-10-CM

## 2019-09-08 DIAGNOSIS — I471 Supraventricular tachycardia, unspecified: Secondary | ICD-10-CM

## 2019-09-08 DIAGNOSIS — K219 Gastro-esophageal reflux disease without esophagitis: Secondary | ICD-10-CM | POA: Diagnosis not present

## 2019-09-08 DIAGNOSIS — Z9109 Other allergy status, other than to drugs and biological substances: Secondary | ICD-10-CM | POA: Diagnosis not present

## 2019-09-08 DIAGNOSIS — Z8546 Personal history of malignant neoplasm of prostate: Secondary | ICD-10-CM

## 2019-09-08 DIAGNOSIS — I10 Essential (primary) hypertension: Secondary | ICD-10-CM | POA: Diagnosis not present

## 2019-09-08 DIAGNOSIS — E7800 Pure hypercholesterolemia, unspecified: Secondary | ICD-10-CM

## 2019-09-08 MED ORDER — ROSUVASTATIN CALCIUM 10 MG PO TABS: 10.0000 mg | ORAL_TABLET | Freq: Every day | ORAL | 1 refills | Status: DC

## 2019-09-08 NOTE — Progress Notes (Signed)
Patient ID: Edward James, male   DOB: Aug 15, 1941, 79 y.o.   MRN: [id number]   Virtual Visit via telephone Note  This visit type was conducted due to national recommendations for restrictions regarding the COVID-19 pandemic (e.g. social distancing).  This format is felt to be most appropriate for this patient at this time.  All issues noted in this document were discussed and addressed.  No physical exam was performed (except for noted visual exam findings with Video Visits).   I connected with Edward James by telephone and verified that I am speaking with the correct person using two identifiers. Location patient: home Location provider: work  Persons participating in the telephone visit: patient, provider  The limitations, risks, security and privacy concerns of performing an evaluation and management service by telephone and the availability of in person appointments have been discussed.  The patient expressed understanding and agreed to proceed.   Reason for visit: scheduled follow up.   HPI: States he is doing better.  Feels better since has turned cool.  No chest pain, increased heart rate or palpitations.  No sob.  No congestion or cough.  No acid reflux.  Just had EGD and colonoscopy - through [place] [facility].  Reviewed results.  States was told did not need another colonoscopy.  No abdominal pain. Bowels moving.  Not as active.  Discussed exercise.  Discussed recent labs.  On simvastatin.  Will change to crestor.  Appears blood pressure may be running a little higher than goal.  Discussed spot checking and sending in readings.  Handling stress.     ROS: See pertinent positives and negatives per HPI.  Past Medical History:  Diagnosis Date  . Atrial fibrillation ([place])   . Diverticulosis   . Dysrhythmia   . GERD (gastroesophageal reflux disease)   . Hypercholesterolemia   . Hypertension   . Prostate cancer [facility])    s/p radical retropubic prostatectomy 1999  . Spinal stenosis      Past Surgical History:  Procedure Laterality Date  . BIOPSY  05/19/2019   Procedure: BIOPSY;  Surgeon: Laurence Spates, MD;  Location: [facility];  Service: Endoscopy;;  . COLONOSCOPY WITH PROPOFOL N/A 05/19/2019   Procedure: COLONOSCOPY WITH PROPOFOL;  Surgeon: Laurence Spates, MD;  Location: [facility];  Service: Endoscopy;  Laterality: N/A;  . ESOPHAGOGASTRODUODENOSCOPY (EGD) WITH PROPOFOL N/A 05/19/2019   Procedure: ESOPHAGOGASTRODUODENOSCOPY (EGD) WITH PROPOFOL;  Surgeon: Laurence Spates, MD;  Location: [facility];  Service: Endoscopy;  Laterality: N/A;  . FOLEY CATHETER    . INGUINAL HERNIA REPAIR  1945  . INGUINAL HERNIA REPAIR Left 12/04/2015   Procedure: HERNIA REPAIR INGUINAL ADULT;  Surgeon: Leonie Green, MD;  Location: [facility] OR[facility];  Service: General;  Laterality: Left;  . LUMBAR FUSION     L3-4 fusion  . pilonidal cyst removal  1969  . POLYPECTOMY  05/19/2019   Procedure: POLYPECTOMY;  Surgeon: Laurence Spates, MD;  Location: [facility];  Service: Endoscopy;;  . RETROPUBIC PROSTATECTOMY     radical (1999)  . TONSILLECTOMY AND ADENOIDECTOMY  1948    Family History  Problem Relation Age of Onset  . AAA (abdominal aortic aneurysm) Father   . Nephrolithiasis Other   . Colon cancer Neg Hx     SOCIAL HX: revieewed   Current Outpatient Medications:  .  aspirin 81 MG tablet, Take 81 mg by mouth at bedtime. , Disp: , Rfl:  .  diltiazem (CARDIZEM CD) 120 MG 24 hr capsule, Take 1 capsule (120 mg total)  by mouth daily., Disp: 90 capsule, Rfl: 3 .  Multiple Vitamins-Minerals (PRESERVISION AREDS PO), Take 1 tablet by mouth daily., Disp: , Rfl:  .  Omega-3 Fatty Acids (FISH OIL) 1200 MG CAPS, Take 1,200 mg by mouth 2 (two) times daily., Disp: , Rfl:  .  pantoprazole (PROTONIX) 40 MG tablet, Take 1 tablet (40 mg total) by mouth daily., Disp: 90 tablet, Rfl: 3 .  Probiotic Product (ALIGN) 4 MG CAPS, Take 4 mg by mouth every evening., Disp: , Rfl:  .  rosuvastatin (CRESTOR)  10 MG tablet, Take 1 tablet (10 mg total) by mouth daily., Disp: 90 tablet, Rfl: 1 .  triamcinolone (NASACORT) 55 MCG/ACT AERO nasal inhaler, Place 2 sprays into the nose at bedtime. , Disp: , Rfl:  .  triamterene-hydrochlorothiazide (MAXZIDE-25) 37.5-25 MG tablet, TAKE ONE-HALF TABLET BY MOUTH ONCE A DAY, Disp: 45 tablet, Rfl: 1  EXAM:  GENERAL: alert.  Sounds to be in no acute distress.  Answering questions appropriately.    PSYCH/NEURO: pleasant and cooperative, no obvious depression or anxiety, speech and thought processing grossly intact  ASSESSMENT AND PLAN:  Discussed the following assessment and plan:  SVT (supraventricular tachycardia) (HCC) Recent stress test and holter.  Diagnosed with SVT.  Has seen Dr Caryl Comes.  Currently doing well.  Denies increased heart rate or palpitations.  Follow.    Back pain S/p surgery.  Doing well.  Follow.    Environmental allergies Controlled.   GERD (gastroesophageal reflux disease) Controlled on current regimen.  Follow.    History of prostate cancer PSA 09/06/19 - .2.   Hypercholesterolemia On simvastatin now.  Change to crestor 10mg  q day. Low cholesterol diet and exercise.  Follow lipid panel and liver function tests.    Hypertension States blood pressure has been running a little high.  Have him spot check his pressure and send in readings.  Follow pressure.  Follow metabolic panel.     No orders of the defined types were placed in this encounter.   Meds ordered this encounter  Medications  . rosuvastatin (CRESTOR) 10 MG tablet    Sig: Take 1 tablet (10 mg total) by mouth daily.    Dispense:  90 tablet    Refill:  1     I discussed the assessment and treatment plan with the patient. The patient was provided an opportunity to ask questions and all were answered. The patient agreed with the plan and demonstrated an understanding of the instructions.   The patient was advised to call back or seek an in-person evaluation if the  symptoms worsen or if the condition fails to improve as anticipated.  I provided 21 minutes of non-face-to-face time during this encounter.   Einar Pheasant, MD

## 2019-09-11 ENCOUNTER — Encounter: Payer: Self-pay | Admitting: Internal Medicine

## 2019-09-11 NOTE — Assessment & Plan Note (Signed)
Recent stress test and holter.  Diagnosed with SVT.  Has seen Dr Caryl Comes.  Currently doing well.  Denies increased heart rate or palpitations.  Follow.

## 2019-09-11 NOTE — Assessment & Plan Note (Signed)
PSA 09/06/19 - .2.

## 2019-09-11 NOTE — Assessment & Plan Note (Signed)
Controlled.  

## 2019-09-11 NOTE — Assessment & Plan Note (Signed)
On simvastatin now.  Change to crestor 10mg  q day. Low cholesterol diet and exercise.  Follow lipid panel and liver function tests.

## 2019-09-11 NOTE — Assessment & Plan Note (Signed)
Controlled on current regimen.  Follow.  

## 2019-09-11 NOTE — Assessment & Plan Note (Signed)
S/p surgery.  Doing well.  Follow.

## 2019-09-11 NOTE — Assessment & Plan Note (Signed)
States blood pressure has been running a little high.  Have him spot check his pressure and send in readings.  Follow pressure.  Follow metabolic panel.

## 2019-09-23 ENCOUNTER — Telehealth: Payer: Self-pay | Admitting: Internal Medicine

## 2019-09-23 DIAGNOSIS — R739 Hyperglycemia, unspecified: Secondary | ICD-10-CM

## 2019-09-23 DIAGNOSIS — E7800 Pure hypercholesterolemia, unspecified: Secondary | ICD-10-CM

## 2019-09-23 DIAGNOSIS — I10 Essential (primary) hypertension: Secondary | ICD-10-CM

## 2019-09-23 NOTE — Telephone Encounter (Signed)
Pt called wanting to have labs done before appt on 03/08/2020. Please advise and Thank you!

## 2019-09-27 ENCOUNTER — Telehealth: Payer: Self-pay | Admitting: Lab

## 2019-09-27 NOTE — Telephone Encounter (Signed)
Called Pt No answer left a VM to call office to schedule a lab appt.

## 2019-09-28 ENCOUNTER — Telehealth: Payer: Self-pay | Admitting: Lab

## 2019-09-28 DIAGNOSIS — E7800 Pure hypercholesterolemia, unspecified: Secondary | ICD-10-CM

## 2019-09-28 DIAGNOSIS — Z125 Encounter for screening for malignant neoplasm of prostate: Secondary | ICD-10-CM

## 2019-09-28 NOTE — Telephone Encounter (Signed)
Pt called back to schedule fasting lab work. Pt stated Dr Nicki Reaper told him on his last visit she  wanted him to have his blood work done. I scheduled Pt a lab appt for 03/06/20 @ 8:00am.  Message from last visit: On simvastatin now.  Change to crestor 10mg  q day. Low cholesterol diet and exercise.  Follow lipid panel and liver function tests.

## 2019-09-28 NOTE — Addendum Note (Signed)
Addended by: Lars Masson on: 09/28/2019 11:20 AM   Modules accepted: Orders

## 2019-09-28 NOTE — Telephone Encounter (Signed)
His lab appt is scheduled. I ordered labs. Just wanted to make sure that you did not need anything else ordered.

## 2019-09-29 NOTE — Telephone Encounter (Signed)
See other message for clarification of labs.

## 2019-09-29 NOTE — Addendum Note (Signed)
Addended by: Alisa Graff on: 09/29/2019 04:51 AM   Modules accepted: Orders

## 2019-09-29 NOTE — Telephone Encounter (Signed)
Patient scheduled for non-fasting labs in 4 weeks

## 2019-09-29 NOTE — Telephone Encounter (Signed)
Regarding his labs, I do want the fasting labs 1-2 days before his 03/08/20 appt.  This has been scheduled.  Also, since I started him on a new cholesterol medication, I would like for him to come in for non fasting lab appt in 4 weeks to check liver panel.  (this is routine when starting a new cholesterol medication).  Please notify pt and schedule a non fasting lab.  I have ordered the liver panel.

## 2019-10-18 ENCOUNTER — Other Ambulatory Visit: Payer: Self-pay

## 2019-10-26 ENCOUNTER — Encounter: Payer: Self-pay | Admitting: Internal Medicine

## 2019-10-26 ENCOUNTER — Other Ambulatory Visit (INDEPENDENT_AMBULATORY_CARE_PROVIDER_SITE_OTHER): Payer: Medicare PPO

## 2019-10-26 ENCOUNTER — Other Ambulatory Visit: Payer: Self-pay

## 2019-10-26 ENCOUNTER — Other Ambulatory Visit: Payer: Medicare PPO

## 2019-10-26 DIAGNOSIS — E7800 Pure hypercholesterolemia, unspecified: Secondary | ICD-10-CM | POA: Diagnosis not present

## 2019-10-26 LAB — HEPATIC FUNCTION PANEL
ALT: 25 U/L (ref 0–53)
AST: 22 U/L (ref 0–37)
Albumin: 4.2 g/dL (ref 3.5–5.2)
Alkaline Phosphatase: 82 U/L (ref 39–117)
Bilirubin, Direct: 0.1 mg/dL (ref 0.0–0.3)
Total Bilirubin: 0.7 mg/dL (ref 0.2–1.2)
Total Protein: 7.2 g/dL (ref 6.0–8.3)

## 2019-11-10 ENCOUNTER — Other Ambulatory Visit: Payer: Self-pay

## 2019-11-10 ENCOUNTER — Ambulatory Visit (INDEPENDENT_AMBULATORY_CARE_PROVIDER_SITE_OTHER): Payer: Medicare PPO | Admitting: Internal Medicine

## 2019-11-10 ENCOUNTER — Encounter: Payer: Self-pay | Admitting: Internal Medicine

## 2019-11-10 VITALS — BP 142/82 | HR 66 | Ht 76.0 in | Wt 245.1 lb

## 2019-11-10 DIAGNOSIS — I471 Supraventricular tachycardia: Secondary | ICD-10-CM | POA: Diagnosis not present

## 2019-11-10 DIAGNOSIS — R001 Bradycardia, unspecified: Secondary | ICD-10-CM | POA: Diagnosis not present

## 2019-11-10 DIAGNOSIS — I10 Essential (primary) hypertension: Secondary | ICD-10-CM | POA: Diagnosis not present

## 2019-11-10 DIAGNOSIS — I493 Ventricular premature depolarization: Secondary | ICD-10-CM | POA: Diagnosis not present

## 2019-11-10 NOTE — Progress Notes (Signed)
Patient Care Team: Einar Pheasant, MD as PCP - General (Internal Medicine) Deboraha Sprang, MD as PCP - Electrophysiology (Cardiology) Wellington Hampshire, MD as PCP - Cardiology (Cardiology)   HPI  Edward James is a 79 y.o. male is seen in followup for syncope and previously identified SVT, frequently occurring and thought to AVNRT. Not assoc  Previously seen 12/19 for SVT identified on a monitor.  Frequent episodes were noted.  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  No associated symptoms and so no therapy was indicated.  He describes an episode in March of becoming quite anxious while he was standing accompanied by lightheadedness.  No associated palpitations.  He has noted this anxiety before and since.  A couple of weeks ago he was out working in the yard in the heat.  A number of hours.  Went to [employer] to watch a baseball game with his son and was sitting in the center field grass.  Became anxious again and somewhat lightheaded.  No palpitations.  Walked to his car with his daughter when he passed out.  Unconscious for a couple of minutes.  Residual fatigue and orthostatic intolerance.  His heart rate monitor which he wears on his watch detected a heart rate of 160.  Notably, this is the rate of his SVT as detected previously.  The patient denies chest pain, shortness of breath, nocturnal dyspnea, orthopnea or peripheral edema.  There have been no palpitations, lightheadedness or syncope.   Tolerating meds  Records and Results Reviewed   Past Medical History:  Diagnosis Date  . Atrial fibrillation ([place])   . Diverticulosis   . Dysrhythmia   . GERD (gastroesophageal reflux disease)   . Hypercholesterolemia   . Hypertension   . Prostate cancer [facility])    s/p radical retropubic prostatectomy 1999  . Spinal stenosis     Past Surgical History:  Procedure Laterality Date  . BIOPSY  05/19/2019   Procedure: BIOPSY;  Surgeon: Laurence Spates, MD;  Location: [facility];  Service: Endoscopy;;  .  COLONOSCOPY WITH PROPOFOL N/A 05/19/2019   Procedure: COLONOSCOPY WITH PROPOFOL;  Surgeon: Laurence Spates, MD;  Location: [facility];  Service: Endoscopy;  Laterality: N/A;  . ESOPHAGOGASTRODUODENOSCOPY (EGD) WITH PROPOFOL N/A 05/19/2019   Procedure: ESOPHAGOGASTRODUODENOSCOPY (EGD) WITH PROPOFOL;  Surgeon: Laurence Spates, MD;  [facility]: [facility];  Service: Endoscopy;  Laterality: N/A;  . FOLEY CATHETER    . INGUINAL HERNIA REPAIR  1945  . INGUINAL HERNIA REPAIR Left 12/04/2015   Procedure: HERNIA REPAIR INGUINAL ADULT;  Surgeon: Leonie Green, MD;  Location: [facility];  Service: General;  Laterality: Left;  . LUMBAR FUSION     L3-4 fusion  . pilonidal cyst removal  1969  . POLYPECTOMY  05/19/2019   Procedure: POLYPECTOMY;  Surgeon: Laurence Spates, MD;  [facility]: [facility];  Service: Endoscopy;;  . RETROPUBIC PROSTATECTOMY     radical (1999)  . TONSILLECTOMY AND ADENOIDECTOMY  1948    Current Meds  Medication Sig  . aspirin 81 MG tablet Take 81 mg by mouth at bedtime.   Marland Kitchen diltiazem (CARDIZEM CD) 120 MG 24 hr capsule Take 1 capsule (120 mg total) by mouth daily.  . Multiple Vitamins-Minerals (PRESERVISION AREDS PO) Take 1 tablet by mouth daily.  . Omega-3 Fatty Acids (FISH OIL) 1200 MG CAPS Take 1,200 mg by mouth 2 (two) times daily.  . pantoprazole (PROTONIX) 40 MG tablet Take 1 tablet (40 mg total) by mouth daily.  . Probiotic Product (ALIGN) 4 MG CAPS  Take 4 mg by mouth every evening.  . rosuvastatin (CRESTOR) 10 MG tablet Take 1 tablet (10 mg total) by mouth daily.  Marland Kitchen triamcinolone (NASACORT) 55 MCG/ACT AERO nasal inhaler Place 2 sprays into the nose at bedtime.   . triamterene-hydrochlorothiazide (MAXZIDE-25) 37.5-25 MG tablet TAKE ONE-HALF TABLET BY MOUTH ONCE A DAY    No Known Allergies    Review of Systems negative except from HPI and PMH  Physical Exam BP (!) 142/82 (BP Location: Left Arm, Patient Position: Sitting, Cuff Size: Normal)   Pulse 66   Ht 6\' 4"   (1.93 m)   Wt 245 lb 2 oz (111.2 kg)   SpO2 99%   BMI 29.84 kg/m  Well developed and nourished in no acute distress HENT normal Neck supple with JVP-  flat *  Clear Regular rate and rhythm, no murmurs or gallops Abd-soft with active BS No Clubbing cyanosis edema Skin-warm and dry A & Oriented  Grossly normal sensory and motor function  ECG sinus rhythm at 58 Interval 20/10/43  Assessment and  Plan SVT  Bradycardia-nocturnal  Syncope  Hypertension  PVCs  Anxiety   No intercurrent syncope.  No intercurrent tachypalpitations.  We will continue his diltiazem.  Blood pressure little bit elevated.  He thinks it is better at home.  Anxiety is better  PVCs are also quiescient  .       Current medicines are reviewed at length with the patient today .  The patient does not  have concerns regarding medicines.

## 2019-11-10 NOTE — Patient Instructions (Signed)

## 2019-11-14 ENCOUNTER — Other Ambulatory Visit: Payer: Self-pay | Admitting: Internal Medicine

## 2019-11-24 ENCOUNTER — Other Ambulatory Visit: Payer: Self-pay

## 2019-11-24 ENCOUNTER — Encounter: Payer: Self-pay | Admitting: Internal Medicine

## 2019-11-24 ENCOUNTER — Ambulatory Visit: Payer: Medicare PPO | Admitting: Internal Medicine

## 2019-11-24 DIAGNOSIS — I10 Essential (primary) hypertension: Secondary | ICD-10-CM

## 2019-11-24 DIAGNOSIS — I471 Supraventricular tachycardia: Secondary | ICD-10-CM | POA: Diagnosis not present

## 2019-11-24 MED ORDER — LISINOPRIL 10 MG PO TABS: 10.0000 mg | ORAL_TABLET | Freq: Every day | ORAL | 1 refills | Status: DC

## 2019-11-24 NOTE — Progress Notes (Signed)
Patient ID: Edward James, male   DOB: 12-05-1940, 79 y.o.   MRN: [id number]   Subjective:    Patient ID: Edward James, male    DOB: 1941/02/09, 79 y.o.   MRN: [id number]  HPI  Patient here as a work in appt for elevated blood pressure.  States he has been doing relatively well.  Tries to stay active.  No chest pain or sob reported.  No increased heart rate or palpitations.  Blood pressure a little elevated at visit with Dr Caryl Comes.  Has monitored.  Handling stress.  No nausea or vomiting reported.  No abdominal pain or bowel change reported.  No headache or dizziness.   Past Medical History:  Diagnosis Date  . Atrial fibrillation ([place])   . Diverticulosis   . Dysrhythmia   . GERD (gastroesophageal reflux disease)   . Hypercholesterolemia   . Hypertension   . Prostate cancer [facility])    s/p radical retropubic prostatectomy 1999  . Spinal stenosis    Past Surgical History:  Procedure Laterality Date  . BIOPSY  05/19/2019   Procedure: BIOPSY;  Surgeon: Laurence Spates, MD;  Location: [facility];  Service: Endoscopy;;  . COLONOSCOPY WITH PROPOFOL N/A 05/19/2019   Procedure: COLONOSCOPY WITH PROPOFOL;  Surgeon: Laurence Spates, MD;  Location: [facility];  Service: Endoscopy;  Laterality: N/A;  . ESOPHAGOGASTRODUODENOSCOPY (EGD) WITH PROPOFOL N/A 05/19/2019   Procedure: ESOPHAGOGASTRODUODENOSCOPY (EGD) WITH PROPOFOL;  Surgeon: Laurence Spates, MD;  [facility];  Service: Endoscopy;  Laterality: N/A;  . FOLEY CATHETER    . INGUINAL HERNIA REPAIR  1945  . INGUINAL HERNIA REPAIR Left 12/04/2015   Procedure: HERNIA REPAIR INGUINAL ADULT;  Surgeon: Leonie Green, MD;  Location: [facility];  Service: General;  Laterality: Left;  . LUMBAR FUSION     L3-4 fusion  . pilonidal cyst removal  1969  . POLYPECTOMY  05/19/2019   Procedure: POLYPECTOMY;  Surgeon: Laurence Spates, MD;  [facility];  Service: Endoscopy;;  . RETROPUBIC PROSTATECTOMY     radical (1999)  . TONSILLECTOMY  AND ADENOIDECTOMY  1948   Family History  Problem Relation Age of Onset  . AAA (abdominal aortic aneurysm) Father   . Nephrolithiasis Other   . Colon cancer Neg Hx    Social History   Socioeconomic History  . Marital status: Married    Spouse name: Not on file  . Number of children: 3  . Years of education: Not on file  . Highest education level: Not on file  Occupational History  . Not on file  Tobacco Use  . Smoking status: Former Smoker    Quit date: 11/28/1973    Years since quitting: 46.0  . Smokeless tobacco: Never Used  Substance and Sexual Activity  . Alcohol use: Yes    Alcohol/week: 0.0 standard drinks    Comment: rarely  . Drug use: No  . Sexual activity: Not on file  Other Topics Concern  . Not on file  Social History Narrative  . Not on file   Social Determinants of Health   Financial Resource Strain:   . Difficulty of Paying Living Expenses:   Food Insecurity:   . Worried About Charity fundraiser in the Last Year:   . Arboriculturist in the Last Year:   Transportation Needs:   . Film/video editor (Medical):   Marland Kitchen Lack of Transportation (Non-Medical):   Physical Activity:   . Days of Exercise per Week:   . Minutes of Exercise  per Session:   Stress:   . Feeling of Stress :   Social Connections:   . Frequency of Communication with Friends and Family:   . Frequency of Social Gatherings with Friends and Family:   . Attends Religious Services:   . Active Member of Clubs or Organizations:   . Attends Archivist Meetings:   Marland Kitchen Marital Status:     Outpatient Encounter Medications as of 11/24/2019  Medication Sig  . aspirin 81 MG tablet Take 81 mg by mouth at bedtime.   . Multiple Vitamins-Minerals (PRESERVISION AREDS PO) Take 1 tablet by mouth daily.  . Omega-3 Fatty Acids (FISH OIL) 1200 MG CAPS Take 1,200 mg by mouth 2 (two) times daily.  . pantoprazole (PROTONIX) 40 MG tablet Take 1 tablet (40 mg total) by mouth daily.  . Probiotic  Product (ALIGN) 4 MG CAPS Take 4 mg by mouth every evening.  . rosuvastatin (CRESTOR) 10 MG tablet Take 1 tablet (10 mg total) by mouth daily.  Marland Kitchen triamcinolone (NASACORT) 55 MCG/ACT AERO nasal inhaler Place 2 sprays into the nose at bedtime.   . triamterene-hydrochlorothiazide (MAXZIDE-25) 37.5-25 MG tablet TAKE 1/2 TABLET BY MOUTH ONCE A DAY  . diltiazem (CARDIZEM CD) 120 MG 24 hr capsule Take 1 capsule (120 mg total) by mouth daily.  Marland Kitchen lisinopril (ZESTRIL) 10 MG tablet Take 1 tablet (10 mg total) by mouth daily.   No facility-administered encounter medications on file as of 11/24/2019.   Review of Systems  Constitutional: Negative for appetite change and unexpected weight change.  Respiratory: Negative for cough, chest tightness and shortness of breath.   Cardiovascular: Negative for chest pain, palpitations and leg swelling.  Gastrointestinal: Negative for abdominal pain, diarrhea, nausea and vomiting.  Musculoskeletal: Negative for joint swelling and myalgias.  Skin: Negative for color change and rash.  Neurological: Negative for dizziness, light-headedness and headaches.  Psychiatric/Behavioral: Negative for agitation and dysphoric mood.       Objective:    Physical Exam Constitutional:      General: He is not in acute distress.    Appearance: Normal appearance. He is well-developed.  HENT:     Head: Normocephalic and atraumatic.  Eyes:     General: No scleral icterus.       Right eye: No discharge.        Left eye: No discharge.     Conjunctiva/sclera: Conjunctivae normal.  Cardiovascular:     Rate and Rhythm: Normal rate and regular rhythm.  Pulmonary:     Effort: Pulmonary effort is normal. No respiratory distress.     Breath sounds: Normal breath sounds.  Abdominal:     General: Bowel sounds are normal.     Palpations: Abdomen is soft.     Tenderness: There is no abdominal tenderness.  Musculoskeletal:        General: No swelling or tenderness.     Cervical back:  Neck supple. No tenderness.  Lymphadenopathy:     Cervical: No cervical adenopathy.  Skin:    Findings: No erythema or rash.  Neurological:     Mental Status: He is alert.  Psychiatric:        Mood and Affect: Mood normal.        Behavior: Behavior normal.     BP (!) 160/88   Pulse (!) 53   Temp (!) 97.1 F (36.2 C)   Resp 16   Wt 248 lb (112.5 kg)   SpO2 98%   BMI 30.19 kg/m  Wt  Readings from Last 3 Encounters:  11/24/19 248 lb (112.5 kg)  11/10/19 245 lb 2 oz (111.2 kg)  05/19/19 230 lb (104.3 kg)     Lab Results  Component Value Date   WBC 7.3 09/06/2019   HGB 15.1 09/06/2019   HCT 45.7 09/06/2019   PLT 233.0 09/06/2019   GLUCOSE 97 09/06/2019   CHOL 167 09/06/2019   TRIG 73.0 09/06/2019   HDL 53.30 09/06/2019   LDLCALC 99 09/06/2019   ALT 25 10/26/2019   AST 22 10/26/2019   NA 142 09/06/2019   K 4.1 09/06/2019   CL 105 09/06/2019   CREATININE 0.86 09/06/2019   BUN 17 09/06/2019   CO2 30 09/06/2019   TSH 2.48 09/06/2019   PSA 0.02 (L) 09/06/2019   HGBA1C 5.5 09/06/2019       Assessment & Plan:   Problem List Items Addressed This Visit    Hypertension    Blood pressure elevated.  On diltiazem and triam/hctz.  Add lisionopril 10mg  q day.  Check metabolic panel in 123456 days.  Have him spot check his pressure.  Get him back in soon to reassess.        Relevant Medications   lisinopril (ZESTRIL) 10 MG tablet   Other Relevant Orders   Basic metabolic panel   SVT (supraventricular tachycardia) (HCC)    Denies increased heart rate or palpitations.  Just evaluated by Dr Caryl Comes.  Stable.  Continue diltiazem.        Relevant Medications   lisinopril (ZESTRIL) 10 MG tablet       Einar Pheasant, MD

## 2019-11-28 ENCOUNTER — Encounter: Payer: Self-pay | Admitting: Internal Medicine

## 2019-11-28 ENCOUNTER — Telehealth: Payer: Self-pay | Admitting: Internal Medicine

## 2019-11-28 NOTE — Assessment & Plan Note (Signed)
Blood pressure elevated.  On diltiazem and triam/hctz.  Add lisionopril 10mg  q day.  Check metabolic panel in 123456 days.  Have him spot check his pressure.  Get him back in soon to reassess.

## 2019-11-28 NOTE — Telephone Encounter (Signed)
Noted on schedule °

## 2019-11-28 NOTE — Assessment & Plan Note (Signed)
Denies increased heart rate or palpitations.  Just evaluated by Dr Caryl Comes.  Stable.  Continue diltiazem.

## 2019-11-28 NOTE — Telephone Encounter (Signed)
The lab on 12/07/19 just needs to be non fasting lab (met b).  The other labs ordered are for later.  Please make note on schedule.

## 2019-12-07 ENCOUNTER — Other Ambulatory Visit: Payer: Self-pay

## 2019-12-07 ENCOUNTER — Other Ambulatory Visit (INDEPENDENT_AMBULATORY_CARE_PROVIDER_SITE_OTHER): Payer: Medicare PPO

## 2019-12-07 DIAGNOSIS — I10 Essential (primary) hypertension: Secondary | ICD-10-CM | POA: Diagnosis not present

## 2019-12-07 LAB — BASIC METABOLIC PANEL
BUN: 18 mg/dL (ref 6–23)
CO2: 29 mEq/L (ref 19–32)
Calcium: 9.3 mg/dL (ref 8.4–10.5)
Chloride: 105 mEq/L (ref 96–112)
Creatinine, Ser: 0.83 mg/dL (ref 0.40–1.50)
GFR: 89.83 mL/min (ref >60.00)
Glucose, Bld: 66 mg/dL — ABNORMAL LOW (ref 70–99)
Potassium: 4 mEq/L (ref 3.5–5.1)
Sodium: 140 mEq/L (ref 135–145)

## 2019-12-21 DIAGNOSIS — L570 Actinic keratosis: Secondary | ICD-10-CM | POA: Diagnosis not present

## 2019-12-21 DIAGNOSIS — X32XXXA Exposure to sunlight, initial encounter: Secondary | ICD-10-CM | POA: Diagnosis not present

## 2019-12-21 DIAGNOSIS — C44629 Squamous cell carcinoma of skin of left upper limb, including shoulder: Secondary | ICD-10-CM | POA: Diagnosis not present

## 2019-12-21 DIAGNOSIS — Z85828 Personal history of other malignant neoplasm of skin: Secondary | ICD-10-CM | POA: Diagnosis not present

## 2019-12-21 DIAGNOSIS — D2272 Melanocytic nevi of left lower limb, including hip: Secondary | ICD-10-CM | POA: Diagnosis not present

## 2019-12-21 DIAGNOSIS — D485 Neoplasm of uncertain behavior of skin: Secondary | ICD-10-CM | POA: Diagnosis not present

## 2019-12-21 DIAGNOSIS — D2262 Melanocytic nevi of left upper limb, including shoulder: Secondary | ICD-10-CM | POA: Diagnosis not present

## 2019-12-21 DIAGNOSIS — D225 Melanocytic nevi of trunk: Secondary | ICD-10-CM | POA: Diagnosis not present

## 2019-12-21 DIAGNOSIS — L821 Other seborrheic keratosis: Secondary | ICD-10-CM | POA: Diagnosis not present

## 2019-12-21 DIAGNOSIS — D2261 Melanocytic nevi of right upper limb, including shoulder: Secondary | ICD-10-CM | POA: Diagnosis not present

## 2019-12-29 ENCOUNTER — Encounter: Payer: Self-pay | Admitting: Internal Medicine

## 2019-12-29 ENCOUNTER — Other Ambulatory Visit: Payer: Self-pay

## 2019-12-29 ENCOUNTER — Ambulatory Visit: Payer: Medicare PPO | Admitting: Internal Medicine

## 2019-12-29 DIAGNOSIS — K219 Gastro-esophageal reflux disease without esophagitis: Secondary | ICD-10-CM | POA: Diagnosis not present

## 2019-12-29 DIAGNOSIS — Z9109 Other allergy status, other than to drugs and biological substances: Secondary | ICD-10-CM | POA: Diagnosis not present

## 2019-12-29 DIAGNOSIS — I471 Supraventricular tachycardia: Secondary | ICD-10-CM | POA: Diagnosis not present

## 2019-12-29 DIAGNOSIS — E7800 Pure hypercholesterolemia, unspecified: Secondary | ICD-10-CM | POA: Diagnosis not present

## 2019-12-29 DIAGNOSIS — I10 Essential (primary) hypertension: Secondary | ICD-10-CM

## 2019-12-29 MED ORDER — LISINOPRIL 20 MG PO TABS: 20.0000 mg | ORAL_TABLET | Freq: Every day | ORAL | 1 refills | Status: DC

## 2019-12-29 NOTE — Progress Notes (Signed)
Patient ID: Edward James, male   DOB: 11/11/1940, 79 y.o.   MRN: [id number]   Subjective:    Patient ID: Edward James, male    DOB: Jun 29, 1941, 79 y.o.   MRN: [id number]  HPI This visit occurred during the SARS-CoV-2 public health emergency.  Safety protocols were in place, including screening questions prior to the visit, additional usage of staff PPE, and extensive cleaning of exam room while observing appropriate contact time as indicated for disinfecting solutions.  Patient here for a scheduled follow up.  Here to f/u on his blood pressure.  Was started on lisinopril 10mg  q day last visit.  He has been monitoring his blood pressure.  Blood pressures averaging 130-140s/70-80.  Tries to stay active.  No chest pain or sob reported.  No abdominal pain or bowel change reported.  Some stuffy nose.  Discussed using flonase and saline.  No significant symptoms.  No chest congestion or cough.     Past Medical History:  Diagnosis Date  . Atrial fibrillation ([place])   . Diverticulosis   . Dysrhythmia   . GERD (gastroesophageal reflux disease)   . Hypercholesterolemia   . Hypertension   . Prostate cancer [facility])    s/p radical retropubic prostatectomy 1999  . Spinal stenosis    Past Surgical History:  Procedure Laterality Date  . BIOPSY  05/19/2019   Procedure: BIOPSY;  Surgeon: Laurence Spates, MD;  [facility];  Service: Endoscopy;;  . COLONOSCOPY WITH PROPOFOL N/A 05/19/2019   Procedure: COLONOSCOPY WITH PROPOFOL;  Surgeon: Laurence Spates, MD;  [facility];  Service: Endoscopy;  Laterality: N/A;  . ESOPHAGOGASTRODUODENOSCOPY (EGD) WITH PROPOFOL N/A 05/19/2019   Procedure: ESOPHAGOGASTRODUODENOSCOPY (EGD) WITH PROPOFOL;  Surgeon: Laurence Spates, MD;  [facility];  Service: Endoscopy;  Laterality: N/A;  . FOLEY CATHETER    . INGUINAL HERNIA REPAIR  1945  . INGUINAL HERNIA REPAIR Left 12/04/2015   Procedure: HERNIA REPAIR INGUINAL ADULT;  Surgeon: Leonie Green,  MD;  Location: [facility];  Service: General;  Laterality: Left;  . LUMBAR FUSION     L3-4 fusion  . pilonidal cyst removal  1969  . POLYPECTOMY  05/19/2019   Procedure: POLYPECTOMY;  Surgeon: Laurence Spates, MD;  [facility];  Service: Endoscopy;;  . RETROPUBIC PROSTATECTOMY     radical (1999)  . TONSILLECTOMY AND ADENOIDECTOMY  1948   Family History  Problem Relation Age of Onset  . AAA (abdominal aortic aneurysm) Father   . Nephrolithiasis Other   . Colon cancer Neg Hx    Social History   Socioeconomic History  . Marital status: Married    Spouse name: Not on file  . Number of children: 3  . Years of education: Not on file  . Highest education level: Not on file  Occupational History  . Not on file  Tobacco Use  . Smoking status: Former Smoker    Quit date: 11/28/1973    Years since quitting: 46.1  . Smokeless tobacco: Never Used  Substance and Sexual Activity  . Alcohol use: Yes    Alcohol/week: 0.0 standard drinks    Comment: rarely  . Drug use: No  . Sexual activity: Not on file  Other Topics Concern  . Not on file  Social History Narrative  . Not on file   Social Determinants of Health   Financial Resource Strain:   . Difficulty of Paying Living Expenses:   Food Insecurity:   . Worried About Charity fundraiser in the  Last Year:   . [place] in the Last Year:   Transportation Needs:   . Film/video editor (Medical):   Marland Kitchen Lack of Transportation (Non-Medical):   Physical Activity:   . Days of Exercise per Week:   . Minutes of Exercise per Session:   Stress:   . Feeling of Stress :   Social Connections:   . Frequency of Communication with Friends and Family:   . Frequency of Social Gatherings with Friends and Family:   . Attends Religious Services:   . Active Member of Clubs or Organizations:   . Attends Archivist Meetings:   Marland Kitchen Marital Status:     Outpatient Encounter Medications as of 12/29/2019  Medication Sig  .  aspirin 81 MG tablet Take 81 mg by mouth at bedtime.   . Multiple Vitamins-Minerals (PRESERVISION AREDS PO) Take 1 tablet by mouth daily.  . Omega-3 Fatty Acids (FISH OIL) 1200 MG CAPS Take 1,200 mg by mouth 2 (two) times daily.  . pantoprazole (PROTONIX) 40 MG tablet Take 1 tablet (40 mg total) by mouth daily.  . Probiotic Product (ALIGN) 4 MG CAPS Take 4 mg by mouth every evening.  . rosuvastatin (CRESTOR) 10 MG tablet Take 1 tablet (10 mg total) by mouth daily.  Marland Kitchen triamcinolone (NASACORT) 55 MCG/ACT AERO nasal inhaler Place 2 sprays into the nose at bedtime.   . triamterene-hydrochlorothiazide (MAXZIDE-25) 37.5-25 MG tablet TAKE 1/2 TABLET BY MOUTH ONCE A DAY  . [DISCONTINUED] lisinopril (ZESTRIL) 10 MG tablet Take 1 tablet (10 mg total) by mouth daily.  Marland Kitchen diltiazem (CARDIZEM CD) 120 MG 24 hr capsule Take 1 capsule (120 mg total) by mouth daily.  Marland Kitchen lisinopril (ZESTRIL) 20 MG tablet Take 1 tablet (20 mg total) by mouth daily.   No facility-administered encounter medications on file as of 12/29/2019.    Review of Systems  Constitutional: Negative for appetite change and unexpected weight change.  HENT: Negative for sinus pressure.        Stuffy nose.    Respiratory: Negative for cough, chest tightness and shortness of breath.   Cardiovascular: Negative for chest pain, palpitations and leg swelling.  Gastrointestinal: Negative for abdominal pain, diarrhea, nausea and vomiting.  Genitourinary: Negative for difficulty urinating and dysuria.  Musculoskeletal: Negative for joint swelling and myalgias.  Skin: Negative for color change and rash.  Neurological: Negative for dizziness, light-headedness and headaches.  Psychiatric/Behavioral: Negative for agitation and dysphoric mood.       Objective:    Physical Exam Vitals reviewed.  Constitutional:      General: He is not in acute distress.    Appearance: Normal appearance. He is well-developed.  HENT:     Head: Normocephalic and  atraumatic.     Right Ear: External ear normal.     Left Ear: External ear normal.  Eyes:     General: No scleral icterus.       Right eye: No discharge.        Left eye: No discharge.     Conjunctiva/sclera: Conjunctivae normal.  Cardiovascular:     Rate and Rhythm: Normal rate and regular rhythm.  Pulmonary:     Effort: Pulmonary effort is normal. No respiratory distress.     Breath sounds: Normal breath sounds.  Abdominal:     General: Bowel sounds are normal.     Palpations: Abdomen is soft.     Tenderness: There is no abdominal tenderness.  Musculoskeletal:  General: No swelling or tenderness.     Cervical back: Neck supple. No tenderness.  Lymphadenopathy:     Cervical: No cervical adenopathy.  Skin:    Findings: No erythema or rash.  Neurological:     Mental Status: He is alert.  Psychiatric:        Mood and Affect: Mood normal.        Behavior: Behavior normal.     BP (!) 142/78   Pulse 62   Temp (!) 97.2 F (36.2 C) (Temporal)   Ht 6\' 4"  (1.93 m)   Wt 238 lb 3.2 oz (108 kg)   SpO2 98%   BMI 28.99 kg/m  Wt Readings from Last 3 Encounters:  12/29/19 238 lb 3.2 oz (108 kg)  11/24/19 248 lb (112.5 kg)  11/10/19 245 lb 2 oz (111.2 kg)     Lab Results  Component Value Date   WBC 7.3 09/06/2019   HGB 15.1 09/06/2019   HCT 45.7 09/06/2019   PLT 233.0 09/06/2019   GLUCOSE 66 (L) 12/07/2019   CHOL 167 09/06/2019   TRIG 73.0 09/06/2019   HDL 53.30 09/06/2019   LDLCALC 99 09/06/2019   ALT 25 10/26/2019   AST 22 10/26/2019   NA 140 12/07/2019   K 4.0 12/07/2019   CL 105 12/07/2019   CREATININE 0.83 12/07/2019   BUN 18 12/07/2019   CO2 29 12/07/2019   TSH 2.48 09/06/2019   PSA 0.02 (L) 09/06/2019   HGBA1C 5.5 09/06/2019       Assessment & Plan:   Problem List Items Addressed This Visit    Environmental allergies    flonase nasal spray and saline nasal spray.  Overall controlled.  Follow.        GERD (gastroesophageal reflux disease)     Controlled on protonix.        Hypercholesterolemia    On simvastatin.  Low cholesterol diet and exercise.  Follow lipid panel and liver function tests.        Relevant Medications   lisinopril (ZESTRIL) 20 MG tablet   Hypertension    Blood pressure as outlined.  Continue diltiazem.  Increase lisinopril to 20mg  q day.  Follow pressures.  Follow metabolic panel.       Relevant Medications   lisinopril (ZESTRIL) 20 MG tablet   SVT (supraventricular tachycardia) (HCC)    No increased heart rate or palpitations.  On diltiazem.  Stable.  Sees cardiology.  Follow.        Relevant Medications   lisinopril (ZESTRIL) 20 MG tablet       Einar Pheasant, MD

## 2020-01-02 ENCOUNTER — Encounter: Payer: Self-pay | Admitting: Internal Medicine

## 2020-01-02 NOTE — Assessment & Plan Note (Signed)
flonase nasal spray and saline nasal spray.  Overall controlled.  Follow.

## 2020-01-02 NOTE — Assessment & Plan Note (Signed)
On simvastatin.  Low cholesterol diet and exercise.  Follow lipid panel and liver function tests.   

## 2020-01-02 NOTE — Assessment & Plan Note (Signed)
No increased heart rate or palpitations.  On diltiazem.  Stable.  Sees cardiology.  Follow.

## 2020-01-02 NOTE — Assessment & Plan Note (Signed)
Blood pressure as outlined.  Continue diltiazem.  Increase lisinopril to 20mg  q day.  Follow pressures.  Follow metabolic panel.

## 2020-01-02 NOTE — Assessment & Plan Note (Signed)
Controlled on protonix.   

## 2020-01-23 DIAGNOSIS — C44629 Squamous cell carcinoma of skin of left upper limb, including shoulder: Secondary | ICD-10-CM | POA: Diagnosis not present

## 2020-02-06 ENCOUNTER — Encounter: Payer: Self-pay | Admitting: Internal Medicine

## 2020-02-06 ENCOUNTER — Ambulatory Visit: Payer: Medicare PPO | Admitting: Internal Medicine

## 2020-02-06 ENCOUNTER — Other Ambulatory Visit: Payer: Self-pay

## 2020-02-06 DIAGNOSIS — I10 Essential (primary) hypertension: Secondary | ICD-10-CM

## 2020-02-06 DIAGNOSIS — E7800 Pure hypercholesterolemia, unspecified: Secondary | ICD-10-CM | POA: Diagnosis not present

## 2020-02-06 DIAGNOSIS — I471 Supraventricular tachycardia: Secondary | ICD-10-CM

## 2020-02-06 NOTE — Progress Notes (Signed)
Patient ID: Edward James, male   DOB: 01/18/1941, 79 y.o.   MRN: [id number]   Subjective:    Patient ID: Edward James, male    DOB: May 03, 1941, 79 y.o.   MRN: [id number]  HPI This visit occurred during the SARS-CoV-2 public health emergency.  Safety protocols were in place, including screening questions prior to the visit, additional usage of staff PPE, and extensive cleaning of exam room while observing appropriate contact time as indicated for disinfecting solutions.  Patient here for a scheduled follow up.  Here to follow up regarding his blood pressure.  He reports he is doing well.  Tries to stay active.  Last visit, increased his lisinopril to 20mg  q day.   Blood pressures doing better.  Reviewed outside blood pressure checks.  Blood pressures averaging:  114-120s/60-70s.  Has lost weight.  Has decreased portion size.  Has increased water intake.  Overall feels good.  No chest pain.  No increased heart rate or palpitations.  No nausea or vomiting.  Bowels stable.    Past Medical History:  Diagnosis Date  . Atrial fibrillation ([place])   . Diverticulosis   . Dysrhythmia   . GERD (gastroesophageal reflux disease)   . Hypercholesterolemia   . Hypertension   . Prostate cancer [facility])    s/p radical retropubic prostatectomy 1999  . Spinal stenosis    Past Surgical History:  Procedure Laterality Date  . BIOPSY  05/19/2019   Procedure: BIOPSY;  Surgeon: Laurence Spates, MD;  Location: [facility];  Service: Endoscopy;;  . COLONOSCOPY WITH PROPOFOL N/A 05/19/2019   Procedure: COLONOSCOPY WITH PROPOFOL;  Surgeon: Laurence Spates, MD;  [facility]: [facility];  Service: Endoscopy;  Laterality: N/A;  . ESOPHAGOGASTRODUODENOSCOPY (EGD) WITH PROPOFOL N/A 05/19/2019   Procedure: ESOPHAGOGASTRODUODENOSCOPY (EGD) WITH PROPOFOL;  Surgeon: Laurence Spates, MD;  [facility];  Service: Endoscopy;  Laterality: N/A;  . FOLEY CATHETER    . INGUINAL HERNIA REPAIR  1945  . INGUINAL HERNIA REPAIR Left  12/04/2015   Procedure: HERNIA REPAIR INGUINAL ADULT;  Surgeon: Leonie Green, MD;  Location: [facility];  Service: General;  Laterality: Left;  . LUMBAR FUSION     L3-4 fusion  . pilonidal cyst removal  1969  . POLYPECTOMY  05/19/2019   Procedure: POLYPECTOMY;  Surgeon: Laurence Spates, MD;  [facility]: [facility];  Service: Endoscopy;;  . RETROPUBIC PROSTATECTOMY     radical (1999)  . TONSILLECTOMY AND ADENOIDECTOMY  1948   Family History  Problem Relation Age of Onset  . AAA (abdominal aortic aneurysm) Father   . Nephrolithiasis Other   . Colon cancer Neg Hx    Social History   Socioeconomic History  . Marital status: Married    Spouse name: Not on file  . Number of children: 3  . Years of education: Not on file  . Highest education level: Not on file  Occupational History  . Not on file  Tobacco Use  . Smoking status: Former Smoker    Quit date: 11/28/1973    Years since quitting: 46.2  . Smokeless tobacco: Never Used  Vaping Use  . Vaping Use: Never used  Substance and Sexual Activity  . Alcohol use: Yes    Alcohol/week: 0.0 standard drinks    Comment: rarely  . Drug use: No  . Sexual activity: Not on file  Other Topics Concern  . Not on file  Social History Narrative  . Not on file   Social Determinants of Health   Financial Resource Strain:   .  Difficulty of Paying Living Expenses:   Food Insecurity:   . Worried About Charity fundraiser in the Last Year:   . Arboriculturist in the Last Year:   Transportation Needs:   . Film/video editor (Medical):   Marland Kitchen Lack of Transportation (Non-Medical):   Physical Activity:   . Days of Exercise per Week:   . Minutes of Exercise per Session:   Stress:   . Feeling of Stress :   Social Connections:   . Frequency of Communication with Friends and Family:   . Frequency of Social Gatherings with Friends and Family:   . Attends Religious Services:   . Active Member of Clubs or Organizations:   . Attends Theatre manager Meetings:   Marland Kitchen Marital Status:     Outpatient Encounter Medications as of 02/06/2020  Medication Sig  . aspirin 81 MG tablet Take 81 mg by mouth at bedtime.   Marland Kitchen diltiazem (CARDIZEM CD) 120 MG 24 hr capsule Take 1 capsule (120 mg total) by mouth daily.  Marland Kitchen lisinopril (ZESTRIL) 20 MG tablet Take 1 tablet (20 mg total) by mouth daily.  . Multiple Vitamins-Minerals (PRESERVISION AREDS PO) Take 1 tablet by mouth daily.  . Omega-3 Fatty Acids (FISH OIL) 1200 MG CAPS Take 1,200 mg by mouth 2 (two) times daily.  . pantoprazole (PROTONIX) 40 MG tablet Take 1 tablet (40 mg total) by mouth daily.  . Probiotic Product (ALIGN) 4 MG CAPS Take 4 mg by mouth every evening.  . rosuvastatin (CRESTOR) 10 MG tablet Take 1 tablet (10 mg total) by mouth daily.  Marland Kitchen triamcinolone (NASACORT) 55 MCG/ACT AERO nasal inhaler Place 2 sprays into the nose at bedtime.   . triamterene-hydrochlorothiazide (MAXZIDE-25) 37.5-25 MG tablet TAKE 1/2 TABLET BY MOUTH ONCE A DAY   No facility-administered encounter medications on file as of 02/06/2020.    Review of Systems  Constitutional: Negative for appetite change and unexpected weight change.  HENT: Negative for congestion and sinus pressure.   Respiratory: Negative for cough, chest tightness and shortness of breath.   Cardiovascular: Negative for chest pain, palpitations and leg swelling.  Gastrointestinal: Negative for abdominal pain, diarrhea, nausea and vomiting.  Genitourinary: Negative for difficulty urinating and dysuria.  Musculoskeletal: Negative for joint swelling and myalgias.  Skin: Negative for color change and rash.  Neurological: Negative for dizziness, light-headedness and headaches.  Psychiatric/Behavioral: Negative for agitation and dysphoric mood.       Objective:    Physical Exam Constitutional:      General: He is not in acute distress.    Appearance: Normal appearance. He is well-developed.  HENT:     Head: Normocephalic and  atraumatic.     Right Ear: External ear normal.     Left Ear: External ear normal.  Eyes:     General: No scleral icterus.       Right eye: No discharge.        Left eye: No discharge.     Conjunctiva/sclera: Conjunctivae normal.  Cardiovascular:     Rate and Rhythm: Normal rate and regular rhythm.  Pulmonary:     Effort: Pulmonary effort is normal. No respiratory distress.     Breath sounds: Normal breath sounds.  Abdominal:     General: Bowel sounds are normal.     Palpations: Abdomen is soft.     Tenderness: There is no abdominal tenderness.  Musculoskeletal:        General: No swelling or tenderness.  Cervical back: Neck supple. No tenderness.  Lymphadenopathy:     Cervical: No cervical adenopathy.  Skin:    Findings: No erythema or rash.  Neurological:     Mental Status: He is alert.  Psychiatric:        Mood and Affect: Mood normal.        Behavior: Behavior normal.     BP 128/78   Pulse 63   Temp 97.8 F (36.6 C)   Resp 16   Ht 6\' 4"  (1.93 m)   Wt 234 lb (106.1 kg)   SpO2 99%   BMI 28.48 kg/m  Wt Readings from Last 3 Encounters:  02/06/20 234 lb (106.1 kg)  12/29/19 238 lb 3.2 oz (108 kg)  11/24/19 248 lb (112.5 kg)     Lab Results  Component Value Date   WBC 7.3 09/06/2019   HGB 15.1 09/06/2019   HCT 45.7 09/06/2019   PLT 233.0 09/06/2019   GLUCOSE 66 (L) 12/07/2019   CHOL 167 09/06/2019   TRIG 73.0 09/06/2019   HDL 53.30 09/06/2019   LDLCALC 99 09/06/2019   ALT 25 10/26/2019   AST 22 10/26/2019   NA 140 12/07/2019   K 4.0 12/07/2019   CL 105 12/07/2019   CREATININE 0.83 12/07/2019   BUN 18 12/07/2019   CO2 29 12/07/2019   TSH 2.48 09/06/2019   PSA 0.02 (L) 09/06/2019   HGBA1C 5.5 09/06/2019       Assessment & Plan:   Problem List Items Addressed This Visit    Hypercholesterolemia    On simvastatin.  Low cholesterol diet and exercise.  Follow lipid panel and liver function tests.        Hypertension    Blood pressure better.   Continue triam/hctz, diltiazem and lisinopril.  Follow pressures.  Follow metabolic panel.       SVT (supraventricular tachycardia) ([place])    Reports no increased heart rate or palpitations.  On diltiazem.  Stable.  Followed by cardiology.           Einar Pheasant, MD

## 2020-02-12 NOTE — Assessment & Plan Note (Signed)
On simvastatin.  Low cholesterol diet and exercise.  Follow lipid panel and liver function tests.   

## 2020-02-12 NOTE — Assessment & Plan Note (Signed)
Blood pressure better.  Continue triam/hctz, diltiazem and lisinopril.  Follow pressures.  Follow metabolic panel.

## 2020-02-12 NOTE — Assessment & Plan Note (Signed)
Reports no increased heart rate or palpitations.  On diltiazem.  Stable.  Followed by cardiology.

## 2020-02-24 ENCOUNTER — Other Ambulatory Visit: Payer: Self-pay | Admitting: Internal Medicine

## 2020-03-06 ENCOUNTER — Other Ambulatory Visit (INDEPENDENT_AMBULATORY_CARE_PROVIDER_SITE_OTHER): Payer: Medicare PPO

## 2020-03-06 ENCOUNTER — Other Ambulatory Visit: Payer: Self-pay

## 2020-03-06 DIAGNOSIS — I10 Essential (primary) hypertension: Secondary | ICD-10-CM | POA: Diagnosis not present

## 2020-03-06 DIAGNOSIS — E7800 Pure hypercholesterolemia, unspecified: Secondary | ICD-10-CM | POA: Diagnosis not present

## 2020-03-06 DIAGNOSIS — Z125 Encounter for screening for malignant neoplasm of prostate: Secondary | ICD-10-CM

## 2020-03-06 DIAGNOSIS — R739 Hyperglycemia, unspecified: Secondary | ICD-10-CM | POA: Diagnosis not present

## 2020-03-06 LAB — BASIC METABOLIC PANEL
BUN: 19 mg/dL (ref 6–23)
CO2: 28 mEq/L (ref 19–32)
Calcium: 9.4 mg/dL (ref 8.4–10.5)
Chloride: 106 mEq/L (ref 96–112)
Creatinine, Ser: 0.91 mg/dL (ref 0.40–1.50)
GFR: 80.73 mL/min (ref >60.00)
Glucose, Bld: 95 mg/dL (ref 70–99)
Potassium: 4.2 mEq/L (ref 3.5–5.1)
Sodium: 141 mEq/L (ref 135–145)

## 2020-03-06 LAB — CBC WITH DIFFERENTIAL/PLATELET
Basophils Absolute: 0 10*3/uL (ref 0.0–0.1)
Basophils Relative: 0.5 % (ref 0.0–3.0)
Eosinophils Absolute: 0.2 10*3/uL (ref 0.0–0.7)
Eosinophils Relative: 2.3 % (ref 0.0–5.0)
HCT: 44.2 % (ref 39.0–52.0)
Hemoglobin: 14.8 g/dL (ref 13.0–17.0)
Lymphocytes Relative: 33.8 % (ref 12.0–46.0)
Lymphs Abs: 2.4 10*3/uL (ref 0.7–4.0)
MCHC: 33.4 g/dL (ref 30.0–36.0)
MCV: 87.2 fl (ref 78.0–100.0)
Monocytes Absolute: 0.7 10*3/uL (ref 0.1–1.0)
Monocytes Relative: 9.3 % (ref 3.0–12.0)
Neutro Abs: 3.9 10*3/uL (ref 1.4–7.7)
Neutrophils Relative %: 54.1 % (ref 43.0–77.0)
Platelets: 209 10*3/uL (ref 150.0–400.0)
RBC: 5.07 Mil/uL (ref 4.22–5.81)
RDW: 13.6 % (ref 11.5–15.5)
WBC: 7.2 10*3/uL (ref 4.0–10.5)

## 2020-03-06 LAB — LIPID PANEL
Cholesterol: 113 mg/dL (ref 0–200)
HDL: 46.9 mg/dL (ref >39.00)
LDL Cholesterol: 54 mg/dL (ref 0–99)
NonHDL: 66.32
Total CHOL/HDL Ratio: 2
Triglycerides: 61 mg/dL (ref 0.0–149.0)
VLDL: 12.2 mg/dL (ref 0.0–40.0)

## 2020-03-06 LAB — HEPATIC FUNCTION PANEL
ALT: 23 U/L (ref 0–53)
AST: 18 U/L (ref 0–37)
Albumin: 4.3 g/dL (ref 3.5–5.2)
Alkaline Phosphatase: 58 U/L (ref 39–117)
Bilirubin, Direct: 0.1 mg/dL (ref 0.0–0.3)
Total Bilirubin: 0.6 mg/dL (ref 0.2–1.2)
Total Protein: 6.3 g/dL (ref 6.0–8.3)

## 2020-03-06 LAB — HEMOGLOBIN A1C: Hgb A1c MFr Bld: 5.7 % (ref 4.6–6.5)

## 2020-03-06 LAB — PSA, MEDICARE: PSA: 0 ng/ml — ABNORMAL LOW (ref 0.10–4.00)

## 2020-03-06 LAB — TSH: TSH: 1.91 u[IU]/mL (ref 0.35–4.50)

## 2020-03-08 ENCOUNTER — Ambulatory Visit (INDEPENDENT_AMBULATORY_CARE_PROVIDER_SITE_OTHER): Payer: Medicare PPO | Admitting: Internal Medicine

## 2020-03-08 ENCOUNTER — Encounter: Payer: Self-pay | Admitting: Internal Medicine

## 2020-03-08 ENCOUNTER — Other Ambulatory Visit: Payer: Self-pay

## 2020-03-08 VITALS — BP 126/72 | HR 61 | Temp 97.5°F | Resp 16 | Ht 76.0 in | Wt 234.0 lb

## 2020-03-08 DIAGNOSIS — R739 Hyperglycemia, unspecified: Secondary | ICD-10-CM

## 2020-03-08 DIAGNOSIS — Z0000 Encounter for general adult medical examination without abnormal findings: Secondary | ICD-10-CM | POA: Diagnosis not present

## 2020-03-08 DIAGNOSIS — E7800 Pure hypercholesterolemia, unspecified: Secondary | ICD-10-CM

## 2020-03-08 DIAGNOSIS — I471 Supraventricular tachycardia: Secondary | ICD-10-CM

## 2020-03-08 DIAGNOSIS — K219 Gastro-esophageal reflux disease without esophagitis: Secondary | ICD-10-CM | POA: Diagnosis not present

## 2020-03-08 DIAGNOSIS — I10 Essential (primary) hypertension: Secondary | ICD-10-CM | POA: Diagnosis not present

## 2020-03-08 DIAGNOSIS — Z8546 Personal history of malignant neoplasm of prostate: Secondary | ICD-10-CM | POA: Diagnosis not present

## 2020-03-08 MED ORDER — ROSUVASTATIN CALCIUM 10 MG PO TABS: 10.0000 mg | ORAL_TABLET | Freq: Every day | ORAL | 1 refills | Status: DC

## 2020-03-08 NOTE — Progress Notes (Signed)
Patient ID: Edward James, male   DOB: [birth year], 79 y.o.   MRN: [id number]   Subjective:    Patient ID: Edward James, male    DOB: 1941/08/10, 79 y.o.   MRN: [id number]  HPI This visit occurred during the SARS-CoV-2 public health emergency.  Safety protocols were in place, including screening questions prior to the visit, additional usage of staff PPE, and extensive cleaning of exam room while observing appropriate contact time as indicated for disinfecting solutions.  Patient here for his physical exam.  He reports he is doing well.  Had an episode approximately one month ago of some increased belching.  He started taking align and this resolved.  Stays active.  No chest pain or sob.  No abdominal pain or acid reflux.  Bowels moving.  States blood pressures averaging 115-130/60-70s.  Handling stress. Uses nasacort and saline nasal spray prn and this controls congestion.  Discussed labs.     Past Medical History:  Diagnosis Date  . Atrial fibrillation ([place])   . Diverticulosis   . Dysrhythmia   . GERD (gastroesophageal reflux disease)   . Hypercholesterolemia   . Hypertension   . Prostate cancer [facility])    s/p radical retropubic prostatectomy 1999  . Spinal stenosis    Past Surgical History:  Procedure Laterality Date  . BIOPSY  05/19/2019   Procedure: BIOPSY;  Surgeon: Laurence Spates, MD;  [facility];  Service: Endoscopy;;  . COLONOSCOPY WITH PROPOFOL N/A 05/19/2019   Procedure: COLONOSCOPY WITH PROPOFOL;  Surgeon: Laurence Spates, MD;  [facility];  Service: Endoscopy;  Laterality: N/A;  . ESOPHAGOGASTRODUODENOSCOPY (EGD) WITH PROPOFOL N/A 05/19/2019   Procedure: ESOPHAGOGASTRODUODENOSCOPY (EGD) WITH PROPOFOL;  Surgeon: Laurence Spates, MD;  [facility];  Service: Endoscopy;  Laterality: N/A;  . FOLEY CATHETER    . INGUINAL HERNIA REPAIR  1945  . INGUINAL HERNIA REPAIR Left 12/04/2015   Procedure: HERNIA REPAIR INGUINAL ADULT;  Surgeon: Leonie Green, MD;  Location: [facility];  Service: General;  Laterality: Left;  . LUMBAR FUSION     L3-4 fusion  . pilonidal cyst removal  1969  . POLYPECTOMY  05/19/2019   Procedure: POLYPECTOMY;  Surgeon: Laurence Spates, MD;  [facility];  Service: Endoscopy;;  . RETROPUBIC PROSTATECTOMY     radical (1999)  . TONSILLECTOMY AND ADENOIDECTOMY  1948   Family History  Problem Relation Age of Onset  . AAA (abdominal aortic aneurysm) Father   . Nephrolithiasis Other   . Colon cancer Neg Hx    Social History   Socioeconomic History  . Marital status: Married    Spouse name: Not on file  . Number of children: 3  . Years of education: Not on file  . Highest education level: Not on file  Occupational History  . Not on file  Tobacco Use  . Smoking status: Former Smoker    Quit date: 11/28/1973    Years since quitting: 46.3  . Smokeless tobacco: Never Used  Vaping Use  . Vaping Use: Never used  Substance and Sexual Activity  . Alcohol use: Yes    Alcohol/week: 0.0 standard drinks    Comment: rarely  . Drug use: No  . Sexual activity: Not on file  Other Topics Concern  . Not on file  Social History Narrative  . Not on file   Social Determinants of Health   Financial Resource Strain:   . Difficulty of Paying Living Expenses:   Food Insecurity:   . Worried About  Running Out of Food in the Last Year:   . [place] in the Last Year:   Transportation Needs:   . Lack of Transportation (Medical):   Marland Kitchen Lack of Transportation (Non-Medical):   Physical Activity:   . Days of Exercise per Week:   . Minutes of Exercise per Session:   Stress:   . Feeling of Stress :   Social Connections:   . Frequency of Communication with Friends and Family:   . Frequency of Social Gatherings with Friends and Family:   . Attends Religious Services:   . Active Member of Clubs or Organizations:   . Attends Archivist Meetings:   Marland Kitchen Marital Status:     Outpatient Encounter  Medications as of 03/08/2020  Medication Sig  . aspirin 81 MG tablet Take 81 mg by mouth at bedtime.   Marland Kitchen lisinopril (ZESTRIL) 20 MG tablet Take 1 tablet (20 mg total) by mouth daily.  . Multiple Vitamins-Minerals (PRESERVISION AREDS PO) Take 1 tablet by mouth daily.  . Omega-3 Fatty Acids (FISH OIL) 1200 MG CAPS Take 1,200 mg by mouth 2 (two) times daily.  . pantoprazole (PROTONIX) 40 MG tablet TAKE 1 TABLET BY MOUTH DAILY  . Probiotic Product (ALIGN) 4 MG CAPS Take 4 mg by mouth every evening.  . rosuvastatin (CRESTOR) 10 MG tablet Take 1 tablet (10 mg total) by mouth daily.  Marland Kitchen triamcinolone (NASACORT) 55 MCG/ACT AERO nasal inhaler Place 2 sprays into the nose at bedtime.   . triamterene-hydrochlorothiazide (MAXZIDE-25) 37.5-25 MG tablet TAKE 1/2 TABLET BY MOUTH ONCE A DAY  . [DISCONTINUED] rosuvastatin (CRESTOR) 10 MG tablet Take 1 tablet (10 mg total) by mouth daily.  Marland Kitchen diltiazem (CARDIZEM CD) 120 MG 24 hr capsule Take 1 capsule (120 mg total) by mouth daily.   No facility-administered encounter medications on file as of 03/08/2020.    Review of Systems  Constitutional: Negative for appetite change and unexpected weight change.  HENT: Negative for congestion and sinus pressure.   Eyes: Negative for pain and visual disturbance.  Respiratory: Negative for cough, chest tightness and shortness of breath.   Cardiovascular: Negative for chest pain, palpitations and leg swelling.  Gastrointestinal: Negative for abdominal pain, diarrhea, nausea and vomiting.  Genitourinary: Negative for difficulty urinating and dysuria.  Musculoskeletal: Negative for joint swelling and myalgias.  Skin: Negative for color change and rash.  Neurological: Negative for dizziness, light-headedness and headaches.  Hematological: Negative for adenopathy. Does not bruise/bleed easily.  Psychiatric/Behavioral: Negative for agitation and dysphoric mood.       Objective:    Physical Exam Vitals reviewed.   Constitutional:      General: He is not in acute distress.    Appearance: Normal appearance. He is well-developed.  HENT:     Head: Normocephalic and atraumatic.  Eyes:     General:        Right eye: No discharge.        Left eye: No discharge.     Conjunctiva/sclera: Conjunctivae normal.  Neck:     Thyroid: No thyromegaly.  Cardiovascular:     Rate and Rhythm: Normal rate and regular rhythm.  Pulmonary:     Effort: No respiratory distress.     Breath sounds: Normal breath sounds. No wheezing.  Abdominal:     General: Bowel sounds are normal.     Palpations: Abdomen is soft.     Tenderness: There is no abdominal tenderness.  Genitourinary:    Comments: Not performed.  Musculoskeletal:        General: No swelling or tenderness.     Cervical back: Neck supple. No tenderness.  Lymphadenopathy:     Cervical: No cervical adenopathy.  Skin:    Findings: No erythema or rash.  Neurological:     Mental Status: He is alert and oriented to person, place, and time.  Psychiatric:        Mood and Affect: Mood normal.        Behavior: Behavior normal.     BP 126/72   Pulse 61   Temp (!) 97.5 F (36.4 C)   Resp 16   Ht _0  (1.93 m)   Wt 234 lb (106.1 kg)   SpO2 99%   BMI 28.48 kg/m  Wt Readings from Last 3 Encounters:  03/08/20 234 lb (106.1 kg)  02/06/20 234 lb (106.1 kg)  12/29/19 238 lb 3.2 oz (108 kg)     Lab Results  Component Value Date   WBC 7.2 03/06/2020   HGB 14.8 03/06/2020   HCT 44.2 03/06/2020   PLT 209.0 03/06/2020   GLUCOSE 95 03/06/2020   CHOL 113 03/06/2020   TRIG 61.0 03/06/2020   HDL 46.90 03/06/2020   LDLCALC 54 03/06/2020   ALT 23 03/06/2020   AST 18 03/06/2020   NA 141 03/06/2020   K 4.2 03/06/2020   CL 106 03/06/2020   CREATININE 0.91 03/06/2020   BUN 19 03/06/2020   CO2 28 03/06/2020   TSH 1.91 03/06/2020   PSA 0.00 Repeated and verified X2. (L) 03/06/2020   HGBA1C 5.7 03/06/2020       Assessment & Plan:   Problem List  Items Addressed This Visit    GERD (gastroesophageal reflux disease)    Controlled on protonix.        Health care maintenance    Physical today 03/08/20.  PSA 03/06/20 - 0.0.  Colonoscopy 05/19/19 - inflammatory polyp.  States was told did not need f/u colonoscopy.        History of prostate cancer    PSA 0.0 - 03/06/20.        Hypercholesterolemia    On crestor.  Low cholesterol diet and exercise.  Follow lipid panel and liver function tests.        Relevant Medications   rosuvastatin (CRESTOR) 10 MG tablet   Other Relevant Orders   Hepatic function panel   Lipid panel   Hyperglycemia    Low carb diet and exercise.  Follow met b and a1c.       Relevant Orders   Hemoglobin A1c   Hypertension    Blood pressure on my check 126/72.  Continue triam/hctz, diltiazem and lisinopril.  Follow pressures.  Follow metabolic panel.        Relevant Medications   rosuvastatin (CRESTOR) 10 MG tablet   Other Relevant Orders   Basic metabolic panel   SVT (supraventricular tachycardia) ([facility])    No increased heart rate or palpitations.  On diltiazem.  Stable.  Followed by cardiology.        Relevant Medications   rosuvastatin (CRESTOR) 10 MG tablet    Other Visit Diagnoses    Routine general medical examination at a health care facility    -  Primary       Einar Pheasant, MD

## 2020-03-08 NOTE — Assessment & Plan Note (Addendum)
Physical today 03/08/20.  PSA 03/06/20 - 0.0.  Colonoscopy 05/19/19 - inflammatory polyp.  States was told did not need f/u colonoscopy.

## 2020-03-14 ENCOUNTER — Encounter: Payer: Self-pay | Admitting: Internal Medicine

## 2020-03-14 DIAGNOSIS — R739 Hyperglycemia, unspecified: Secondary | ICD-10-CM | POA: Insufficient documentation

## 2020-03-14 NOTE — Assessment & Plan Note (Signed)
PSA 0.0 - 03/06/20.

## 2020-03-14 NOTE — Assessment & Plan Note (Signed)
No increased heart rate or palpitations.  On diltiazem.  Stable.  Followed by cardiology.

## 2020-03-14 NOTE — Assessment & Plan Note (Signed)
Low carb diet and exercise.  Follow met b and a1c.  

## 2020-03-14 NOTE — Assessment & Plan Note (Signed)
Blood pressure on my check 126/72.  Continue triam/hctz, diltiazem and lisinopril.  Follow pressures.  Follow metabolic panel.

## 2020-03-14 NOTE — Assessment & Plan Note (Signed)
On crestor.  Low cholesterol diet and exercise.  Follow lipid panel and liver function tests.   

## 2020-03-14 NOTE — Assessment & Plan Note (Signed)
Controlled on protonix.   

## 2020-03-18 IMAGING — CR DG CHEST 2V
2 series · 2 of 2 positions shown · non-contrast
Comparison: None.

CLINICAL DATA: 77-year-old male with chest pain.

EXAM:
CHEST - 2 VIEW

[chest pa]
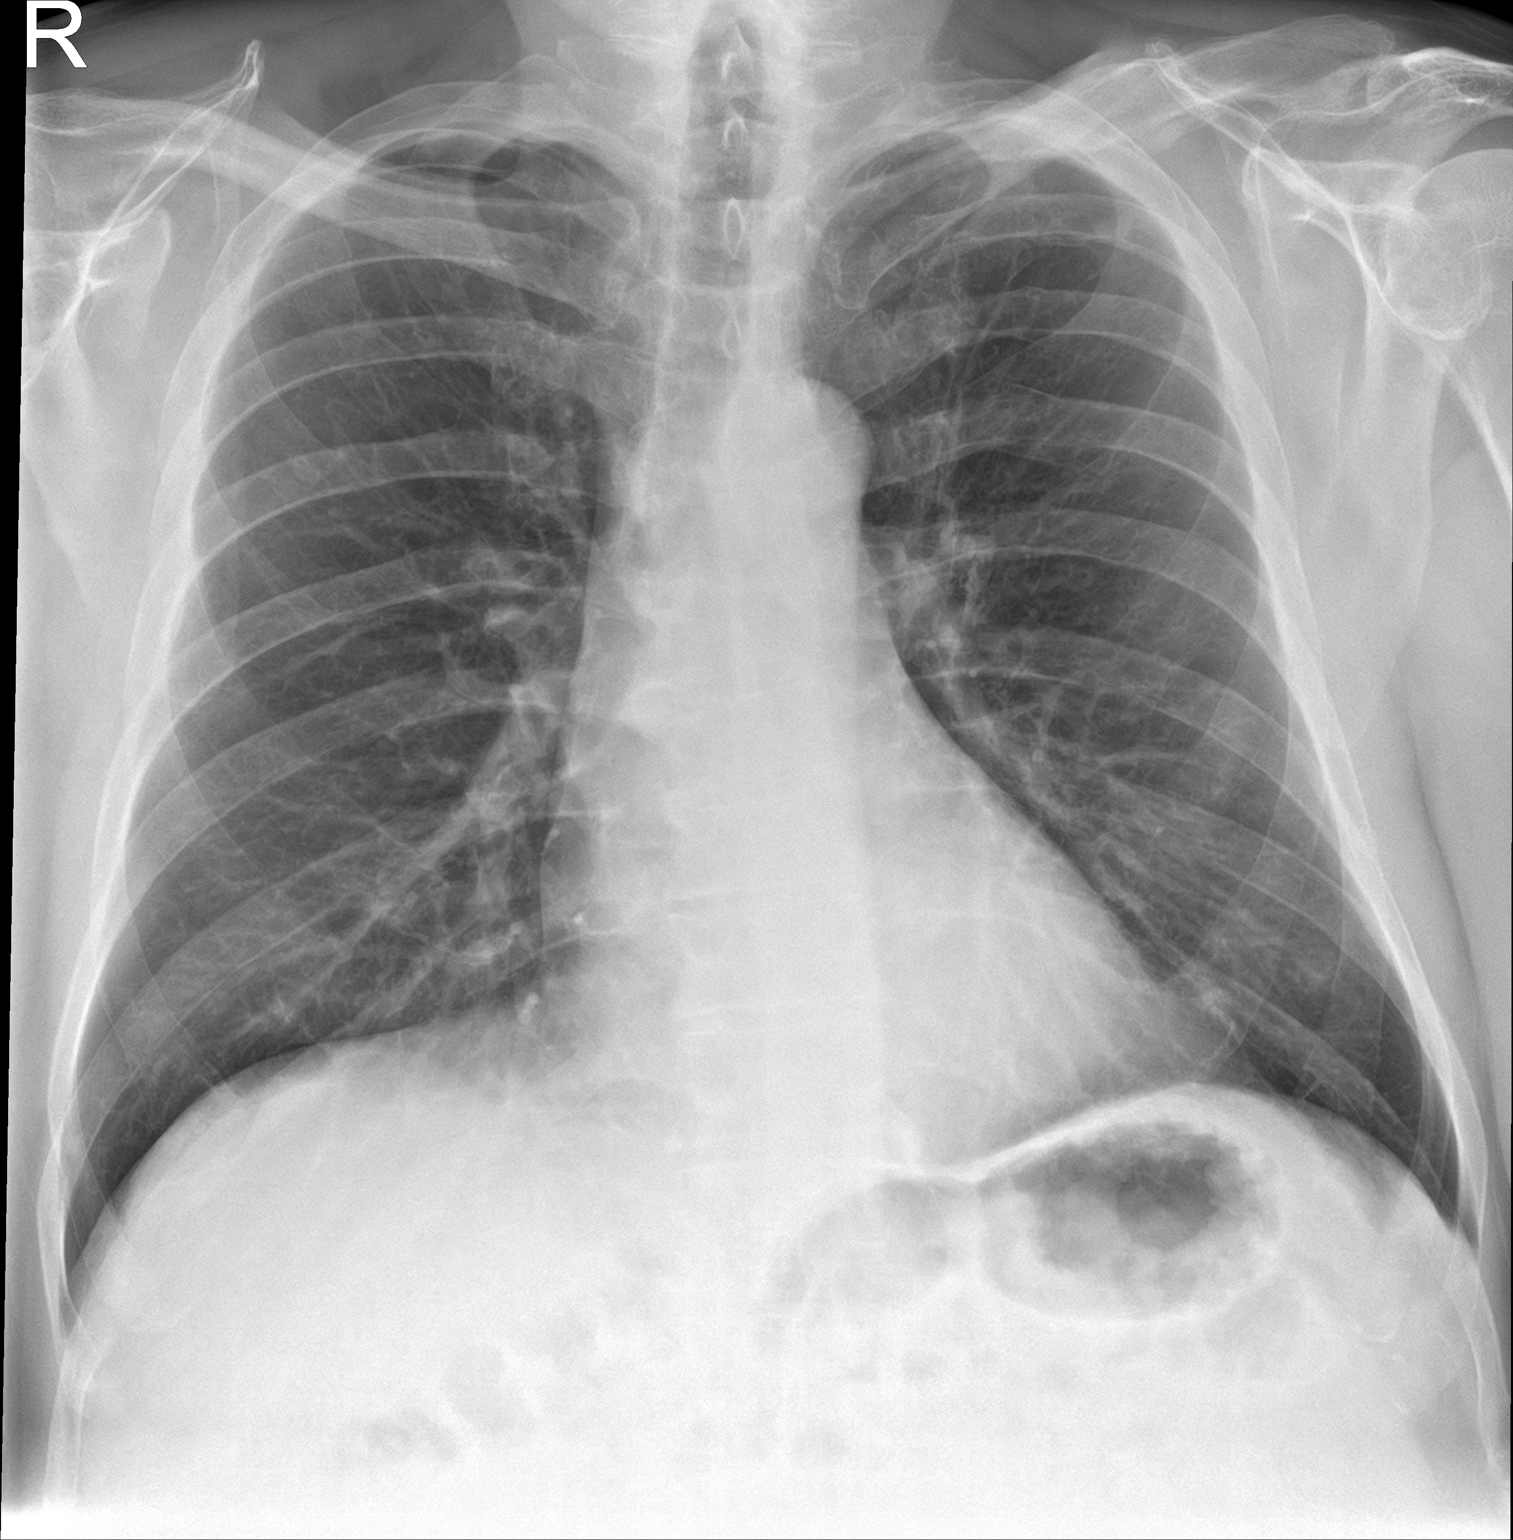

[chest lat]
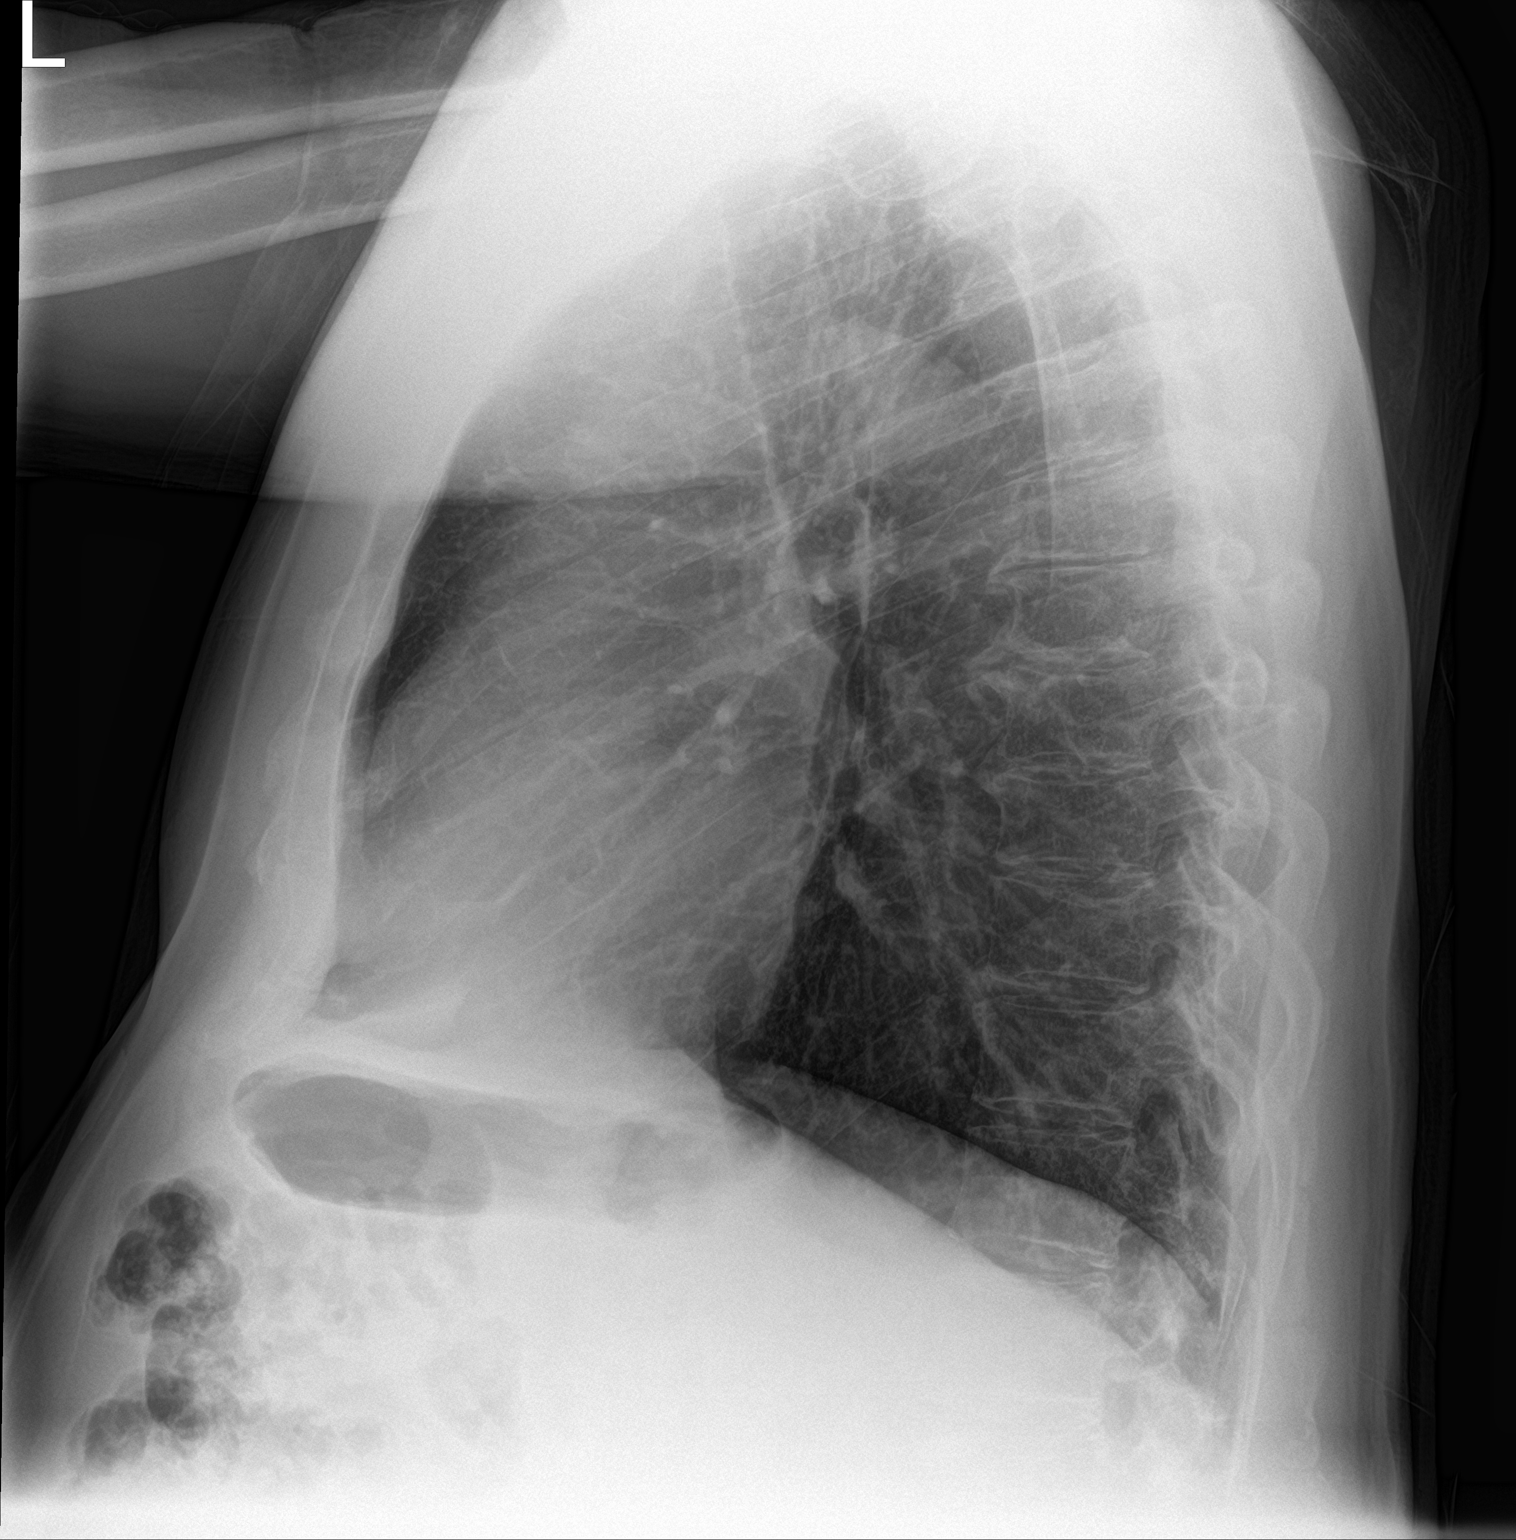

[2 of 2 positions shown; findings below may reference images not displayed]

FINDINGS: The heart size and mediastinal contours are within normal limits.
Both lungs are clear. The visualized skeletal structures are
unremarkable.
IMPRESSION: No active cardiopulmonary disease.

## 2020-03-26 ENCOUNTER — Other Ambulatory Visit: Payer: Self-pay | Admitting: Internal Medicine

## 2020-05-12 ENCOUNTER — Other Ambulatory Visit: Payer: Self-pay | Admitting: Internal Medicine

## 2020-05-31 DIAGNOSIS — D2271 Melanocytic nevi of right lower limb, including hip: Secondary | ICD-10-CM | POA: Diagnosis not present

## 2020-05-31 DIAGNOSIS — D2262 Melanocytic nevi of left upper limb, including shoulder: Secondary | ICD-10-CM | POA: Diagnosis not present

## 2020-05-31 DIAGNOSIS — D2261 Melanocytic nevi of right upper limb, including shoulder: Secondary | ICD-10-CM | POA: Diagnosis not present

## 2020-05-31 DIAGNOSIS — X32XXXA Exposure to sunlight, initial encounter: Secondary | ICD-10-CM | POA: Diagnosis not present

## 2020-05-31 DIAGNOSIS — D225 Melanocytic nevi of trunk: Secondary | ICD-10-CM | POA: Diagnosis not present

## 2020-05-31 DIAGNOSIS — L570 Actinic keratosis: Secondary | ICD-10-CM | POA: Diagnosis not present

## 2020-05-31 DIAGNOSIS — Z85828 Personal history of other malignant neoplasm of skin: Secondary | ICD-10-CM | POA: Diagnosis not present

## 2020-06-15 ENCOUNTER — Other Ambulatory Visit: Payer: Self-pay

## 2020-06-15 ENCOUNTER — Ambulatory Visit (INDEPENDENT_AMBULATORY_CARE_PROVIDER_SITE_OTHER): Payer: Medicare PPO

## 2020-06-15 DIAGNOSIS — Z23 Encounter for immunization: Secondary | ICD-10-CM | POA: Diagnosis not present

## 2020-07-02 ENCOUNTER — Other Ambulatory Visit: Payer: Self-pay | Admitting: Internal Medicine

## 2020-07-06 ENCOUNTER — Other Ambulatory Visit: Payer: Self-pay

## 2020-07-06 ENCOUNTER — Other Ambulatory Visit (INDEPENDENT_AMBULATORY_CARE_PROVIDER_SITE_OTHER): Payer: Medicare PPO

## 2020-07-06 DIAGNOSIS — I10 Essential (primary) hypertension: Secondary | ICD-10-CM

## 2020-07-06 DIAGNOSIS — R739 Hyperglycemia, unspecified: Secondary | ICD-10-CM

## 2020-07-06 DIAGNOSIS — E7800 Pure hypercholesterolemia, unspecified: Secondary | ICD-10-CM

## 2020-07-06 LAB — BASIC METABOLIC PANEL
BUN: 17 mg/dL (ref 6–23)
CO2: 29 mEq/L (ref 19–32)
Calcium: 9.3 mg/dL (ref 8.4–10.5)
Chloride: 105 mEq/L (ref 96–112)
Creatinine, Ser: 0.82 mg/dL (ref 0.40–1.50)
GFR: 84.68 mL/min (ref >60.00)
Glucose, Bld: 84 mg/dL (ref 70–99)
Potassium: 4.7 mEq/L (ref 3.5–5.1)
Sodium: 140 mEq/L (ref 135–145)

## 2020-07-06 LAB — HEPATIC FUNCTION PANEL
ALT: 29 U/L (ref 0–53)
AST: 22 U/L (ref 0–37)
Albumin: 4.2 g/dL (ref 3.5–5.2)
Alkaline Phosphatase: 87 U/L (ref 39–117)
Bilirubin, Direct: 0.2 mg/dL (ref 0.0–0.3)
Total Bilirubin: 0.7 mg/dL (ref 0.2–1.2)
Total Protein: 6.4 g/dL (ref 6.0–8.3)

## 2020-07-06 LAB — HEMOGLOBIN A1C: Hgb A1c MFr Bld: 5.7 % (ref 4.6–6.5)

## 2020-07-06 LAB — LIPID PANEL
Cholesterol: 130 mg/dL (ref 0–200)
HDL: 54.3 mg/dL (ref >39.00)
LDL Cholesterol: 65 mg/dL (ref 0–99)
NonHDL: 75.74
Total CHOL/HDL Ratio: 2
Triglycerides: 56 mg/dL (ref 0.0–149.0)
VLDL: 11.2 mg/dL (ref 0.0–40.0)

## 2020-07-09 ENCOUNTER — Ambulatory Visit: Payer: Medicare PPO | Admitting: Internal Medicine

## 2020-07-10 ENCOUNTER — Other Ambulatory Visit: Payer: Self-pay

## 2020-07-10 ENCOUNTER — Ambulatory Visit: Payer: Medicare PPO | Admitting: Internal Medicine

## 2020-07-10 DIAGNOSIS — R739 Hyperglycemia, unspecified: Secondary | ICD-10-CM | POA: Diagnosis not present

## 2020-07-10 DIAGNOSIS — E7800 Pure hypercholesterolemia, unspecified: Secondary | ICD-10-CM | POA: Diagnosis not present

## 2020-07-10 DIAGNOSIS — K219 Gastro-esophageal reflux disease without esophagitis: Secondary | ICD-10-CM | POA: Diagnosis not present

## 2020-07-10 DIAGNOSIS — I471 Supraventricular tachycardia: Secondary | ICD-10-CM

## 2020-07-10 DIAGNOSIS — Z8546 Personal history of malignant neoplasm of prostate: Secondary | ICD-10-CM

## 2020-07-10 DIAGNOSIS — I10 Essential (primary) hypertension: Secondary | ICD-10-CM

## 2020-07-10 DIAGNOSIS — M545 Low back pain, unspecified: Secondary | ICD-10-CM

## 2020-07-10 NOTE — Progress Notes (Signed)
Patient ID: Edward James, male   DOB: June 15, 1941, 79 y.o.   MRN: [id number]   Subjective:    Patient ID: Edward James, male    DOB: 1940/11/13, 79 y.o.   MRN: [id number]  HPI This visit occurred during the SARS-CoV-2 public health emergency.  Safety protocols were in place, including screening questions prior to the visit, additional usage of staff PPE, and extensive cleaning of exam room while observing appropriate contact time as indicated for disinfecting solutions.  Patient here for a scheduled follow up. He reports he is doing relatively well.  Does report some increased stress with covid, etc.  Overall he feels he is handling things relatively well and does not feel needs any further intervention.  He has noticed some low back discomfort.  No radicular symptoms.  Limits his activity at times - if stands for long periods or with increased walking.  No chest pain or sob reported.  No cough or congestion.  Previous increased gas when off align.  Back taking align now - resolved.  Bowels stable.  Has had two episodes of SVT in 6 months.  Sees Dr Caryl Comes.  Feels heart is doing better.     Past Medical History:  Diagnosis Date  . Atrial fibrillation ([place])   . Diverticulosis   . Dysrhythmia   . GERD (gastroesophageal reflux disease)   . Hypercholesterolemia   . Hypertension   . Prostate cancer [facility])    s/p radical retropubic prostatectomy 1999  . Spinal stenosis    Past Surgical History:  Procedure Laterality Date  . BIOPSY  05/19/2019   Procedure: BIOPSY;  Surgeon: Laurence Spates, MD;  Location: [facility];  Service: Endoscopy;;  . COLONOSCOPY WITH PROPOFOL N/A 05/19/2019   Procedure: COLONOSCOPY WITH PROPOFOL;  Surgeon: Laurence Spates, MD;  Location: [facility];  Service: Endoscopy;  Laterality: N/A;  . ESOPHAGOGASTRODUODENOSCOPY (EGD) WITH PROPOFOL N/A 05/19/2019   Procedure: ESOPHAGOGASTRODUODENOSCOPY (EGD) WITH PROPOFOL;  Surgeon: Laurence Spates, MD;  [facility];   Service: Endoscopy;  Laterality: N/A;  . FOLEY CATHETER    . INGUINAL HERNIA REPAIR  1945  . INGUINAL HERNIA REPAIR Left 12/04/2015   Procedure: HERNIA REPAIR INGUINAL ADULT;  Surgeon: Leonie Green, MD;  Location: [facility];  Service: General;  Laterality: Left;  . LUMBAR FUSION     L3-4 fusion  . pilonidal cyst removal  1969  . POLYPECTOMY  05/19/2019   Procedure: POLYPECTOMY;  Surgeon: Laurence Spates, MD;  [facility];  Service: Endoscopy;;  . RETROPUBIC PROSTATECTOMY     radical (1999)  . TONSILLECTOMY AND ADENOIDECTOMY  1948   Family History  Problem Relation Age of Onset  . AAA (abdominal aortic aneurysm) Father   . Nephrolithiasis Other   . Colon cancer Neg Hx    Social History   Socioeconomic History  . Marital status: Married    Spouse name: Not on file  . Number of children: 3  . Years of education: Not on file  . Highest education level: Not on file  Occupational History  . Not on file  Tobacco Use  . Smoking status: Former Smoker    Quit date: 11/28/1973    Years since quitting: 46.6  . Smokeless tobacco: Never Used  Vaping Use  . Vaping Use: Never used  Substance and Sexual Activity  . Alcohol use: Yes    Alcohol/week: 0.0 standard drinks    Comment: rarely  . Drug use: No  . Sexual activity: Not on file  Other Topics  Concern  . Not on file  Social History Narrative  . Not on file   Social Determinants of Health   Financial Resource Strain:   . Difficulty of Paying Living Expenses: Not on file  Food Insecurity:   . Worried About Charity fundraiser in the Last Year: Not on file  . Ran Out of Food in the Last Year: Not on file  Transportation Needs:   . Lack of Transportation (Medical): Not on file  . Lack of Transportation (Non-Medical): Not on file  Physical Activity:   . Days of Exercise per Week: Not on file  . Minutes of Exercise per Session: Not on file  Stress:   . Feeling of Stress : Not on file  Social Connections:   .  Frequency of Communication with Friends and Family: Not on file  . Frequency of Social Gatherings with Friends and Family: Not on file  . Attends Religious Services: Not on file  . Active Member of Clubs or Organizations: Not on file  . Attends Archivist Meetings: Not on file  . Marital Status: Not on file    Outpatient Encounter Medications as of 07/10/2020  Medication Sig  . aspirin 81 MG tablet Take 81 mg by mouth at bedtime.   Marland Kitchen diltiazem (CARDIZEM CD) 120 MG 24 hr capsule TAKE 1 CAPSULE BY MOUTH DAILY  . lisinopril (ZESTRIL) 20 MG tablet TAKE 1 TABLET BY MOUTH DAILY  . Multiple Vitamins-Minerals (PRESERVISION AREDS PO) Take 1 tablet by mouth daily.  . Omega-3 Fatty Acids (FISH OIL) 1200 MG CAPS Take 1,200 mg by mouth 2 (two) times daily.  . pantoprazole (PROTONIX) 40 MG tablet TAKE 1 TABLET BY MOUTH DAILY  . Probiotic Product (ALIGN) 4 MG CAPS Take 4 mg by mouth every evening.  . rosuvastatin (CRESTOR) 10 MG tablet Take 1 tablet (10 mg total) by mouth daily.  Marland Kitchen triamcinolone (NASACORT) 55 MCG/ACT AERO nasal inhaler Place 2 sprays into the nose at bedtime.   . triamterene-hydrochlorothiazide (MAXZIDE-25) 37.5-25 MG tablet TAKE 1/2 TABLET BY MOUTH ONCE A DAY   No facility-administered encounter medications on file as of 07/10/2020.    Review of Systems  Constitutional: Negative for appetite change and unexpected weight change.  HENT: Negative for congestion and sinus pressure.   Respiratory: Negative for cough, chest tightness and shortness of breath.   Cardiovascular: Negative for chest pain, palpitations and leg swelling.  Gastrointestinal: Negative for abdominal pain, diarrhea, nausea and vomiting.  Genitourinary: Negative for difficulty urinating and dysuria.  Musculoskeletal: Positive for back pain. Negative for joint swelling and myalgias.  Skin: Negative for color change and rash.  Neurological: Negative for dizziness, light-headedness and headaches.   Psychiatric/Behavioral: Negative for agitation and dysphoric mood.       Objective:    Physical Exam Vitals reviewed.  Constitutional:      General: He is not in acute distress.    Appearance: Normal appearance. He is well-developed.  HENT:     Head: Normocephalic and atraumatic.     Right Ear: External ear normal.     Left Ear: External ear normal.  Eyes:     General: No scleral icterus.       Right eye: No discharge.        Left eye: No discharge.     Conjunctiva/sclera: Conjunctivae normal.  Cardiovascular:     Rate and Rhythm: Normal rate and regular rhythm.  Pulmonary:     Effort: Pulmonary effort is normal. No respiratory  distress.     Breath sounds: Normal breath sounds.  Abdominal:     General: Bowel sounds are normal.     Palpations: Abdomen is soft.     Tenderness: There is no abdominal tenderness.  Musculoskeletal:        General: No swelling or tenderness.     Cervical back: Neck supple. No tenderness.  Lymphadenopathy:     Cervical: No cervical adenopathy.  Skin:    Findings: No erythema or rash.  Neurological:     Mental Status: He is alert.  Psychiatric:        Mood and Affect: Mood normal.        Behavior: Behavior normal.     BP 134/74   Pulse 65   Temp 97.6 F (36.4 C) (Oral)   Resp 16   Ht '6\' 4"'  (1.93 m)   Wt 236 lb 9.6 oz (107.3 kg)   SpO2 99%   BMI 28.80 kg/m  Wt Readings from Last 3 Encounters:  07/10/20 236 lb 9.6 oz (107.3 kg)  03/08/20 234 lb (106.1 kg)  02/06/20 234 lb (106.1 kg)     Lab Results  Component Value Date   WBC 7.2 03/06/2020   HGB 14.8 03/06/2020   HCT 44.2 03/06/2020   PLT 209.0 03/06/2020   GLUCOSE 84 07/06/2020   CHOL 130 07/06/2020   TRIG 56.0 07/06/2020   HDL 54.30 07/06/2020   LDLCALC 65 07/06/2020   ALT 29 07/06/2020   AST 22 07/06/2020   NA 140 07/06/2020   K 4.7 07/06/2020   CL 105 07/06/2020   CREATININE 0.82 07/06/2020   BUN 17 07/06/2020   CO2 29 07/06/2020   TSH 1.91 03/06/2020   PSA  0.00 Repeated and verified X2. (L) 03/06/2020   HGBA1C 5.7 07/06/2020       Assessment & Plan:   Problem List Items Addressed This Visit    SVT (supraventricular tachycardia) ([place])    Followed by Dr Caryl Comes.  Appears to be doing better.  On diltiazem.  Follow.        Hypertension    Continue triam/hctz, diltiazem and lisinopril.  Blood pressure as outlined - checked by me 134/74.  Follow pressures. Follow metabolic panel.       Hyperglycemia    Low carb diet and exercise.  Follow met b and a1c.       Hypercholesterolemia    On crestor.  Low cholesterol diet and exercise.  Follow lipid panel and liver function tests.        History of prostate cancer    03/06/20 - PSA 0.0.        GERD (gastroesophageal reflux disease)    No upper symptoms reported. On protonix.        Back pain    Low back pain.  Aggravated by standing for long periods.  Tylenol.  Desires no further intervention at this time.  Follow.           Einar Pheasant, MD

## 2020-07-15 ENCOUNTER — Encounter: Payer: Self-pay | Admitting: Internal Medicine

## 2020-07-15 NOTE — Assessment & Plan Note (Signed)
03/06/20 - PSA 0.0.

## 2020-07-15 NOTE — Assessment & Plan Note (Signed)
Low back pain.  Aggravated by standing for long periods.  Tylenol.  Desires no further intervention at this time.  Follow.

## 2020-07-15 NOTE — Assessment & Plan Note (Signed)
No upper symptoms reported.  On protonix.   

## 2020-07-15 NOTE — Assessment & Plan Note (Signed)
On crestor.  Low cholesterol diet and exercise.  Follow lipid panel and liver function tests.   

## 2020-07-15 NOTE — Assessment & Plan Note (Signed)
Low carb diet and exercise.  Follow met b and a1c.  

## 2020-07-15 NOTE — Assessment & Plan Note (Signed)
Continue triam/hctz, diltiazem and lisinopril.  Blood pressure as outlined - checked by me 134/74.  Follow pressures. Follow metabolic panel.

## 2020-07-15 NOTE — Assessment & Plan Note (Signed)
Followed by Dr Caryl Comes.  Appears to be doing better.  On diltiazem.  Follow.

## 2020-09-11 ENCOUNTER — Other Ambulatory Visit: Payer: Self-pay | Admitting: Internal Medicine

## 2020-11-01 ENCOUNTER — Telehealth: Payer: Self-pay | Admitting: *Deleted

## 2020-11-01 DIAGNOSIS — I10 Essential (primary) hypertension: Secondary | ICD-10-CM

## 2020-11-01 DIAGNOSIS — Z8546 Personal history of malignant neoplasm of prostate: Secondary | ICD-10-CM

## 2020-11-01 DIAGNOSIS — R739 Hyperglycemia, unspecified: Secondary | ICD-10-CM

## 2020-11-01 DIAGNOSIS — E7800 Pure hypercholesterolemia, unspecified: Secondary | ICD-10-CM

## 2020-11-01 NOTE — Telephone Encounter (Signed)
Order placed for f/u labs.  

## 2020-11-01 NOTE — Telephone Encounter (Signed)
Please place future orders for lab appt.  

## 2020-11-05 ENCOUNTER — Other Ambulatory Visit: Payer: Self-pay

## 2020-11-05 ENCOUNTER — Other Ambulatory Visit (INDEPENDENT_AMBULATORY_CARE_PROVIDER_SITE_OTHER): Payer: Medicare PPO

## 2020-11-05 ENCOUNTER — Other Ambulatory Visit: Payer: Self-pay | Admitting: Internal Medicine

## 2020-11-05 DIAGNOSIS — R739 Hyperglycemia, unspecified: Secondary | ICD-10-CM | POA: Diagnosis not present

## 2020-11-05 DIAGNOSIS — E7800 Pure hypercholesterolemia, unspecified: Secondary | ICD-10-CM

## 2020-11-05 DIAGNOSIS — Z8546 Personal history of malignant neoplasm of prostate: Secondary | ICD-10-CM | POA: Diagnosis not present

## 2020-11-05 DIAGNOSIS — I10 Essential (primary) hypertension: Secondary | ICD-10-CM | POA: Diagnosis not present

## 2020-11-05 LAB — LIPID PANEL
Cholesterol: 133 mg/dL (ref 0–200)
HDL: 51.6 mg/dL (ref >39.00)
LDL Cholesterol: 67 mg/dL (ref 0–99)
NonHDL: 80.92
Total CHOL/HDL Ratio: 3
Triglycerides: 70 mg/dL (ref 0.0–149.0)
VLDL: 14 mg/dL (ref 0.0–40.0)

## 2020-11-05 LAB — HEPATIC FUNCTION PANEL
ALT: 15 U/L (ref 0–53)
AST: 15 U/L (ref 0–37)
Albumin: 4.2 g/dL (ref 3.5–5.2)
Alkaline Phosphatase: 56 U/L (ref 39–117)
Bilirubin, Direct: 0.2 mg/dL (ref 0.0–0.3)
Total Bilirubin: 0.8 mg/dL (ref 0.2–1.2)
Total Protein: 6.5 g/dL (ref 6.0–8.3)

## 2020-11-05 LAB — BASIC METABOLIC PANEL
BUN: 19 mg/dL (ref 6–23)
CO2: 28 mEq/L (ref 19–32)
Calcium: 9.4 mg/dL (ref 8.4–10.5)
Chloride: 105 mEq/L (ref 96–112)
Creatinine, Ser: 0.91 mg/dL (ref 0.40–1.50)
GFR: 81.05 mL/min (ref >60.00)
Glucose, Bld: 95 mg/dL (ref 70–99)
Potassium: 4.5 mEq/L (ref 3.5–5.1)
Sodium: 140 mEq/L (ref 135–145)

## 2020-11-05 LAB — PSA, MEDICARE: PSA: 0.01 ng/ml — ABNORMAL LOW (ref 0.10–4.00)

## 2020-11-05 LAB — HEMOGLOBIN A1C: Hgb A1c MFr Bld: 5.6 % (ref 4.6–6.5)

## 2020-11-07 ENCOUNTER — Ambulatory Visit: Payer: Medicare PPO | Admitting: Internal Medicine

## 2020-11-08 ENCOUNTER — Other Ambulatory Visit: Payer: Self-pay

## 2020-11-08 ENCOUNTER — Encounter: Payer: Self-pay | Admitting: Internal Medicine

## 2020-11-08 ENCOUNTER — Ambulatory Visit: Payer: Medicare PPO | Admitting: Internal Medicine

## 2020-11-08 VITALS — BP 130/78 | HR 65 | Temp 98.2°F | Resp 16 | Ht 76.0 in | Wt 243.6 lb

## 2020-11-08 DIAGNOSIS — K219 Gastro-esophageal reflux disease without esophagitis: Secondary | ICD-10-CM | POA: Diagnosis not present

## 2020-11-08 DIAGNOSIS — R739 Hyperglycemia, unspecified: Secondary | ICD-10-CM | POA: Diagnosis not present

## 2020-11-08 DIAGNOSIS — R140 Abdominal distension (gaseous): Secondary | ICD-10-CM | POA: Diagnosis not present

## 2020-11-08 DIAGNOSIS — I10 Essential (primary) hypertension: Secondary | ICD-10-CM

## 2020-11-08 DIAGNOSIS — Z125 Encounter for screening for malignant neoplasm of prostate: Secondary | ICD-10-CM

## 2020-11-08 DIAGNOSIS — Z8546 Personal history of malignant neoplasm of prostate: Secondary | ICD-10-CM | POA: Diagnosis not present

## 2020-11-08 DIAGNOSIS — I471 Supraventricular tachycardia: Secondary | ICD-10-CM

## 2020-11-08 DIAGNOSIS — E7800 Pure hypercholesterolemia, unspecified: Secondary | ICD-10-CM | POA: Diagnosis not present

## 2020-11-08 NOTE — Progress Notes (Signed)
Patient ID: Edward James, male   DOB: [birth year], 80 y.o.   MRN: [id number]   Subjective:    Patient ID: Edward James, male    DOB: [birth year], 80 y.o.   MRN: [id number]  HPI This visit occurred during the SARS-CoV-2 public health emergency.  Safety protocols were in place, including screening questions prior to the visit, additional usage of staff PPE, and extensive cleaning of exam room while observing appropriate contact time as indicated for disinfecting solutions.  Patient here for a scheduled follow up.  Here to follow up regarding blood pressure and cholesterol.  Also has a history of SVT.  Stable.  Sees Dr Caryl Comes.  Tries to stay active.  No chest pain or sob reported.  Breathing stable. No cough or congestion.  No acid reflux.  No abdominal pain.  Bowels moving.  Bloating better since taking align. No significant issue now.    Past Medical History:  Diagnosis Date  . Atrial fibrillation ([place])   . Diverticulosis   . Dysrhythmia   . GERD (gastroesophageal reflux disease)   . Hypercholesterolemia   . Hypertension   . Prostate cancer [facility])    s/p radical retropubic prostatectomy 1999  . Spinal stenosis    Past Surgical History:  Procedure Laterality Date  . BIOPSY  05/19/2019   Procedure: BIOPSY;  Surgeon: Laurence Spates, MD;  [facility];  Service: Endoscopy;;  . COLONOSCOPY WITH PROPOFOL N/A 05/19/2019   Procedure: COLONOSCOPY WITH PROPOFOL;  Surgeon: Laurence Spates, MD;  [facility];  Service: Endoscopy;  Laterality: N/A;  . ESOPHAGOGASTRODUODENOSCOPY (EGD) WITH PROPOFOL N/A 05/19/2019   Procedure: ESOPHAGOGASTRODUODENOSCOPY (EGD) WITH PROPOFOL;  Surgeon: Laurence Spates, MD;  [facility];  Service: Endoscopy;  Laterality: N/A;  . FOLEY CATHETER    . INGUINAL HERNIA REPAIR  1945  . INGUINAL HERNIA REPAIR Left 12/04/2015   Procedure: HERNIA REPAIR INGUINAL ADULT;  Surgeon: Leonie Green, MD;  Location: [facility];  Service: General;  Laterality:  Left;  . LUMBAR FUSION     L3-4 fusion  . pilonidal cyst removal  1969  . POLYPECTOMY  05/19/2019   Procedure: POLYPECTOMY;  Surgeon: Laurence Spates, MD;  [facility];  Service: Endoscopy;;  . RETROPUBIC PROSTATECTOMY     radical (1999)  . TONSILLECTOMY AND ADENOIDECTOMY  1948   Family History  Problem Relation Age of Onset  . AAA (abdominal aortic aneurysm) Father   . Nephrolithiasis Other   . Colon cancer Neg Hx    Social History   Socioeconomic History  . Marital status: Married    Spouse name: Not on file  . Number of children: 3  . Years of education: Not on file  . Highest education level: Not on file  Occupational History  . Not on file  Tobacco Use  . Smoking status: Former Smoker    Quit date: 11/28/1973    Years since quitting: 46.9  . Smokeless tobacco: Never Used  Vaping Use  . Vaping Use: Never used  Substance and Sexual Activity  . Alcohol use: Yes    Alcohol/week: 0.0 standard drinks    Comment: rarely  . Drug use: No  . Sexual activity: Not on file  Other Topics Concern  . Not on file  Social History Narrative  . Not on file   Social Determinants of Health   Financial Resource Strain: Not on file  Food Insecurity: Not on file  Transportation Needs: Not on file  Physical Activity: Not on file  Stress:  Not on file  Social Connections: Not on file    Outpatient Encounter Medications as of 11/08/2020  Medication Sig  . aspirin 81 MG tablet Take 81 mg by mouth at bedtime.   Marland Kitchen diltiazem (CARDIZEM CD) 120 MG 24 hr capsule TAKE 1 CAPSULE BY MOUTH DAILY  . lisinopril (ZESTRIL) 20 MG tablet TAKE 1 TABLET BY MOUTH DAILY  . Multiple Vitamins-Minerals (PRESERVISION AREDS PO) Take 1 tablet by mouth daily.  . Omega-3 Fatty Acids (FISH OIL) 1200 MG CAPS Take 1,200 mg by mouth 2 (two) times daily.  . pantoprazole (PROTONIX) 40 MG tablet TAKE 1 TABLET BY MOUTH DAILY  . Probiotic Product (ALIGN) 4 MG CAPS Take 4 mg by mouth every evening.  .  rosuvastatin (CRESTOR) 10 MG tablet TAKE 1 TABLET BY MOUTH DAILY  . triamcinolone (NASACORT) 55 MCG/ACT AERO nasal inhaler Place 2 sprays into the nose at bedtime.  . triamterene-hydrochlorothiazide (MAXZIDE-25) 37.5-25 MG tablet TAKE 1/2 TABLET BY MOUTH ONCE A DAY   No facility-administered encounter medications on file as of 11/08/2020.    Review of Systems  Constitutional: Negative for appetite change and unexpected weight change.  HENT: Negative for congestion and sinus pressure.   Respiratory: Negative for cough, chest tightness and shortness of breath.   Cardiovascular: Negative for chest pain, palpitations and leg swelling.  Gastrointestinal: Negative for abdominal pain, diarrhea, nausea and vomiting.  Genitourinary: Negative for difficulty urinating and dysuria.  Musculoskeletal: Negative for joint swelling and myalgias.  Skin: Negative for color change and rash.  Neurological: Negative for dizziness, light-headedness and headaches.  Psychiatric/Behavioral: Negative for agitation and dysphoric mood.       Objective:    Physical Exam Vitals reviewed.  Constitutional:      General: He is not in acute distress.    Appearance: Normal appearance. He is well-developed.  HENT:     Head: Normocephalic and atraumatic.     Right Ear: External ear normal.     Left Ear: External ear normal.  Eyes:     General: No scleral icterus.       Right eye: No discharge.        Left eye: No discharge.     Conjunctiva/sclera: Conjunctivae normal.  Cardiovascular:     Rate and Rhythm: Normal rate and regular rhythm.  Pulmonary:     Effort: Pulmonary effort is normal. No respiratory distress.     Breath sounds: Normal breath sounds.  Abdominal:     General: Bowel sounds are normal.     Palpations: Abdomen is soft.     Tenderness: There is no abdominal tenderness.  Musculoskeletal:        General: No swelling or tenderness.     Cervical back: Neck supple. No tenderness.  Lymphadenopathy:      Cervical: No cervical adenopathy.  Skin:    Findings: No erythema or rash.  Neurological:     Mental Status: He is alert.  Psychiatric:        Mood and Affect: Mood normal.        Behavior: Behavior normal.     BP 130/78   Pulse 65   Temp 98.2 F (36.8 C) (Oral)   Resp 16   Ht '6\' 4"'  (1.93 m)   Wt 243 lb 9.6 oz (110.5 kg)   SpO2 99%   BMI 29.65 kg/m  Wt Readings from Last 3 Encounters:  11/08/20 243 lb 9.6 oz (110.5 kg)  07/10/20 236 lb 9.6 oz (107.3 kg)  03/08/20 234  lb (106.1 kg)     Lab Results  Component Value Date   WBC 7.2 03/06/2020   HGB 14.8 03/06/2020   HCT 44.2 03/06/2020   PLT 209.0 03/06/2020   GLUCOSE 95 11/05/2020   CHOL 133 11/05/2020   TRIG 70.0 11/05/2020   HDL 51.60 11/05/2020   LDLCALC 67 11/05/2020   ALT 15 11/05/2020   AST 15 11/05/2020   NA 140 11/05/2020   K 4.5 11/05/2020   CL 105 11/05/2020   CREATININE 0.91 11/05/2020   BUN 19 11/05/2020   CO2 28 11/05/2020   TSH 1.91 03/06/2020   PSA 0.01 (L) 11/05/2020   HGBA1C 5.6 11/05/2020       Assessment & Plan:   Problem List Items Addressed This Visit    Bloating    Bloating improved on align.  Not a significant issue for him.  Follow.        GERD (gastroesophageal reflux disease)    No upper symptoms reported. On protonix.       History of prostate cancer    PSA just checked - .01.  Follow.        Hypercholesterolemia    On crestor.  Continue.  Low cholesterol diet and exercise.  Follow lipid panel and liver function tests.       Relevant Orders   Hepatic function panel   Lipid panel   Hyperglycemia    Low carb diet and exercise.  Follow met b and a1c.       Relevant Orders   Hemoglobin A1c   Hypertension    Continue triam/hctz, diltiazem and lisinopril.  Blood pressures as outlined.  Follow pressures.  Follow metabolic panel.       Relevant Orders   CBC with Differential/Platelet   TSH   Basic metabolic panel   SVT (supraventricular tachycardia) ([facility])     On diltiazem. Doing well.  No significant issues.  Followed by Dr Caryl Comes.        Other Visit Diagnoses    Prostate cancer screening    -  Primary   Relevant Orders   PSA, Medicare       Einar Pheasant, MD

## 2020-11-10 ENCOUNTER — Encounter: Payer: Self-pay | Admitting: Internal Medicine

## 2020-11-10 NOTE — Assessment & Plan Note (Signed)
Bloating improved on align.  Not a significant issue for him.  Follow.

## 2020-11-10 NOTE — Assessment & Plan Note (Signed)
On crestor.  Continue.  Low cholesterol diet and exercise.  Follow lipid panel and liver function tests.  

## 2020-11-10 NOTE — Assessment & Plan Note (Signed)
PSA just checked - .01.  Follow.

## 2020-11-10 NOTE — Assessment & Plan Note (Signed)
Continue triam/hctz, diltiazem and lisinopril.  Blood pressures as outlined.  Follow pressures.  Follow metabolic panel.

## 2020-11-10 NOTE — Assessment & Plan Note (Signed)
No upper symptoms reported.  On protonix.   

## 2020-11-10 NOTE — Assessment & Plan Note (Signed)
On diltiazem. Doing well.  No significant issues.  Followed by Dr Caryl Comes.

## 2020-11-10 NOTE — Assessment & Plan Note (Signed)
Low carb diet and exercise.  Follow met b and a1c.  

## 2020-11-22 ENCOUNTER — Ambulatory Visit (INDEPENDENT_AMBULATORY_CARE_PROVIDER_SITE_OTHER): Payer: Medicare PPO

## 2020-11-22 VITALS — Ht 76.0 in | Wt 243.0 lb

## 2020-11-22 DIAGNOSIS — Z0000 Encounter for general adult medical examination without abnormal findings: Secondary | ICD-10-CM

## 2020-11-22 NOTE — Progress Notes (Signed)
Subjective:   Edward James is a 80 y.o. male who presents for an Initial Medicare Annual Wellness Visit.  Review of Systems    No ROS.  Medicare Wellness Virtual Visit.   Cardiac Risk Factors include: advanced age (>20men, >1 women);male gender;hypertension     Objective:    Today's Vitals   11/22/20 0840  Weight: 243 lb (110.2 kg)  Height: 6\' 4"  (1.93 m)   Body mass index is 29.58 kg/m.  Advanced Directives 11/22/2020 05/19/2019 05/17/2019 05/26/2018 12/04/2015 11/29/2015  Does Patient Have a Medical Advance Directive? No No No No No No  Would patient like information on creating a medical advance directive? No - Patient declined No - Patient declined No - Patient declined No - Patient declined - -    Current Medications (verified) Outpatient Encounter Medications as of 11/22/2020  Medication Sig  . aspirin 81 MG tablet Take 81 mg by mouth at bedtime.   Marland Kitchen diltiazem (CARDIZEM CD) 120 MG 24 hr capsule TAKE 1 CAPSULE BY MOUTH DAILY  . lisinopril (ZESTRIL) 20 MG tablet TAKE 1 TABLET BY MOUTH DAILY  . Multiple Vitamins-Minerals (PRESERVISION AREDS PO) Take 1 tablet by mouth daily.  . Omega-3 Fatty Acids (FISH OIL) 1200 MG CAPS Take 1,200 mg by mouth 2 (two) times daily.  . pantoprazole (PROTONIX) 40 MG tablet TAKE 1 TABLET BY MOUTH DAILY  . Probiotic Product (ALIGN) 4 MG CAPS Take 4 mg by mouth every evening.  . rosuvastatin (CRESTOR) 10 MG tablet TAKE 1 TABLET BY MOUTH DAILY  . triamcinolone (NASACORT) 55 MCG/ACT AERO nasal inhaler Place 2 sprays into the nose at bedtime.  . triamterene-hydrochlorothiazide (MAXZIDE-25) 37.5-25 MG tablet TAKE 1/2 TABLET BY MOUTH ONCE A DAY   No facility-administered encounter medications on file as of 11/22/2020.    Allergies (verified) Patient has no known allergies.   History: Past Medical History:  Diagnosis Date  . Atrial fibrillation ([place])   . Diverticulosis   . Dysrhythmia   . GERD (gastroesophageal reflux disease)   .  Hypercholesterolemia   . Hypertension   . Prostate cancer [facility])    s/p radical retropubic prostatectomy 1999  . Spinal stenosis    Past Surgical History:  Procedure Laterality Date  . BIOPSY  05/19/2019   Procedure: BIOPSY;  Surgeon: Laurence Spates, MD;  Location: [facility];  Service: Endoscopy;;  . COLONOSCOPY WITH PROPOFOL N/A 05/19/2019   Procedure: COLONOSCOPY WITH PROPOFOL;  Surgeon: Laurence Spates, MD;  Location: [facility];  Service: Endoscopy;  Laterality: N/A;  . ESOPHAGOGASTRODUODENOSCOPY (EGD) WITH PROPOFOL N/A 05/19/2019   Procedure: ESOPHAGOGASTRODUODENOSCOPY (EGD) WITH PROPOFOL;  Surgeon: Laurence Spates, MD;  Location: [facility];  Service: Endoscopy;  Laterality: N/A;  . FOLEY CATHETER    . INGUINAL HERNIA REPAIR  1945  . INGUINAL HERNIA REPAIR Left 12/04/2015   Procedure: HERNIA REPAIR INGUINAL ADULT;  Surgeon: Leonie Green, MD;  Location: [facility] ORS;  Service: General;  Laterality: Left;  . LUMBAR FUSION     L3-4 fusion  . pilonidal cyst removal  1969  . POLYPECTOMY  05/19/2019   Procedure: POLYPECTOMY;  Surgeon: Laurence Spates, MD;  Location: [facility];  Service: Endoscopy;;  . RETROPUBIC PROSTATECTOMY     radical (1999)  . TONSILLECTOMY AND ADENOIDECTOMY  1948   Family History  Problem Relation Age of Onset  . Arthritis Mother   . Dementia Mother   . AAA (abdominal aortic aneurysm) Father   . Nephrolithiasis Other   . Colon cancer Neg Hx  Social History   Socioeconomic History  . Marital status: Married    Spouse name: Not on file  . Number of children: 3  . Years of education: Not on file  . Highest education level: Not on file  Occupational History  . Not on file  Tobacco Use  . Smoking status: Former Smoker    Quit date: 11/28/1973    Years since quitting: 47.0  . Smokeless tobacco: Never Used  Vaping Use  . Vaping Use: Never used  Substance and Sexual Activity  . Alcohol use: Yes    Alcohol/week: 0.0 standard drinks    Comment:  rarely  . Drug use: No  . Sexual activity: Not on file  Other Topics Concern  . Not on file  Social History Narrative  . Not on file   Social Determinants of Health   Financial Resource Strain: Low Risk   . Difficulty of Paying Living Expenses: Not hard at all  Food Insecurity: No Food Insecurity  . Worried About Charity fundraiser in the Last Year: Never true  . Ran Out of Food in the Last Year: Never true  Transportation Needs: No Transportation Needs  . Lack of Transportation (Medical): No  . Lack of Transportation (Non-Medical): No  Physical Activity: Not on file  Stress: No Stress Concern Present  . Feeling of Stress : Not at all  Social Connections: Unknown  . Frequency of Communication with Friends and Family: Not on file  . Frequency of Social Gatherings with Friends and Family: Not on file  . Attends Religious Services: Not on file  . Active Member of Clubs or Organizations: Not on file  . Attends Archivist Meetings: Not on file  . Marital Status: Married    Tobacco Counseling Counseling given: Not Answered   Clinical Intake:  Pre-visit preparation completed: Yes        Diabetes: No  How often do you need to have someone help you when you read instructions, pamphlets, or other written materials from your doctor or pharmacy?: 1 - Never  Interpreter Needed?: No      Activities of Daily Living In your present state of health, do you have any difficulty performing the following activities: 11/22/2020  Hearing? N  Vision? N  Difficulty concentrating or making decisions? N  Walking or climbing stairs? N  Dressing or bathing? N  Doing errands, shopping? N  Preparing Food and eating ? N  Using the Toilet? N  In the past six months, have you accidently leaked urine? N  Do you have problems with loss of bowel control? N  Managing your Medications? N  Managing your Finances? N  Housekeeping or managing your Housekeeping? N  Some recent data  might be hidden    Patient Care Team: Einar Pheasant, MD as PCP - General (Internal Medicine) Deboraha Sprang, MD as PCP - Electrophysiology (Cardiology) Wellington Hampshire, MD as PCP - Cardiology (Cardiology)  Indicate any recent Medical Services you may have received from other than [facility] providers in the past year (date may be approximate).     Assessment:   This is a routine wellness examination for [place].  I connected with [place] today by telephone and verified that I am speaking with the correct person using two identifiers. Location patient: home Location provider: work Persons participating in the virtual visit: patient, Marine scientist.    I discussed the limitations, risks, security and privacy concerns of performing an evaluation and management service by telephone  and the availability of in person appointments. The patient expressed understanding and verbally consented to this telephonic visit.    Interactive audio and video telecommunications were attempted between this provider and patient, however failed, due to patient having technical difficulties OR patient did not have access to video capability.  We continued and completed visit with audio only.  Some vital signs may be absent or patient reported.   Hearing/Vision screen  Hearing Screening   125Hz  250Hz  500Hz  1000Hz  2000Hz  3000Hz  4000Hz  6000Hz  8000Hz   Right ear:           Left ear:           Comments: Patient is able to hear conversational tones without difficulty.  No issues reported.  Vision Screening Comments: Wears corrective lenses Visual acuity not assessed, virtual visit.  They have seen their ophthalmologist in the last 12 months.     Dietary issues and exercise activities discussed: Current Exercise Habits: Home exercise routine, Intensity: Mild  Healthy diet Good water intake  Goals    . Follow up with Primary Care Provider     As needed      Depression Screen PHQ 2/9 Scores 11/22/2020 12/29/2019  09/08/2019 08/04/2018 06/10/2017 06/09/2016 12/03/2015  PHQ - 2 Score 0 0 0 0 0 0 0    Fall Risk Fall Risk  11/22/2020 12/29/2019 09/08/2019 08/04/2018 06/10/2017  Falls in the past year? 0 0 0 0 No  Number falls in past yr: 0 0 - 0 -  Injury with Fall? 0 - - 0 -  Follow up Falls evaluation completed Falls evaluation completed Falls evaluation completed - -    FALL RISK PREVENTION PERTAINING TO THE HOME: Handrails in use when climbing stairs? Yes Home free of loose throw rugs in walkways, pet beds, electrical cords, etc? Yes  Adequate lighting in your home to reduce risk of falls? Yes   ASSISTIVE DEVICES UTILIZED TO PREVENT FALLS: Life alert? No  Use of a cane, walker or w/c? No  Grab bars in the bathroom? No  Shower chair or bench in shower? No  Elevated toilet seat? Yes   TIMED UP AND GO:  Was the test performed? No . Virtual visit.   Cognitive Function:  Patient is alert and oriented x3.  Denies difficulty focusing, making decisions, memory loss.  Enjoys reading MMSE/6CIT deferred. Normal by direct communication/observation.      Immunizations Immunization History  Administered Date(s) Administered  . Fluad Quad(high Dose 65+) 06/20/2019, 06/15/2020  . Influenza, High Dose Seasonal PF 06/09/2016, 06/10/2017, 06/15/2018  . Influenza,inj,Quad PF,6+ Mos 06/21/2013, 05/05/2014, 05/09/2015  . Influenza-Unspecified 06/01/2015  . Moderna Sars-Covid-2 Vaccination 09/16/2019, 10/14/2019, 07/13/2020  . Pneumococcal Conjugate-13 11/01/2013  . Pneumococcal Polysaccharide-23 06/10/2017    TDAP status: Due, Education has been provided regarding the importance of this vaccine. Advised may receive this vaccine at local pharmacy or Health Dept. Aware to provide a copy of the vaccination record if obtained from local pharmacy or Health Dept. Verbalized acceptance and understanding. Deferred.  Health Maintenance Health Maintenance  Topic Date Due  . TETANUS/TDAP  11/22/2021 (Originally  11/29/1959)  . COVID-19 Vaccine (4 - Booster for Moderna series) 01/10/2021  . INFLUENZA VACCINE  Completed  . PNA vac Low Risk Adult  Completed  . HPV VACCINES  Aged Out  . Hepatitis C Screening  Discontinued   Colorectal cancer screening: No longer required.    Lung Cancer Screening: (Low Dose CT Chest recommended if Age 51-80 years, 30 pack-year currently smoking OR  have quit w/in 15years.) does not qualify.   Vision Screening: Recommended annual ophthalmology exams for early detection of glaucoma and other disorders of the eye. Is the patient up to date with their annual eye exam?  Yes   Dental Screening: Recommended annual dental exams for proper oral hygiene. Visits every 6 months.   Community Resource Referral / Chronic Care Management: CRR required this visit?  No   CCM required this visit?  No      Plan:   Keep all routine maintenance appointments.   Next scheduled lab 03/08/21 @ 8:45  Cpe 03/12/21 @ 10:00  I have personally reviewed and noted the following in the patient's chart:   . Medical and social history . Use of alcohol, tobacco or illicit drugs  . Current medications and supplements . Functional ability and status . Nutritional status . Physical activity . Advanced directives . List of other physicians . Hospitalizations, surgeries, and ER visits in previous 12 months . Vitals . Screenings to include cognitive, depression, and falls . Referrals and appointments  In addition, I have reviewed and discussed with patient certain preventive protocols, quality metrics, and best practice recommendations. A written personalized care plan for preventive services as well as general preventive health recommendations were provided to patient via mychart.     Varney Biles, LPN   [id number]

## 2020-11-22 NOTE — Patient Instructions (Addendum)
Edward James , Thank you for taking time to come for your Medicare Wellness Visit. I appreciate your ongoing commitment to your health goals. Please review the following plan we discussed and let me know if I can assist you in the future.   These are the goals we discussed: Goals    . Follow up with Primary Care Provider     As needed       This is a list of the screening recommended for you and due dates:  Health Maintenance  Topic Date Due  . Tetanus Vaccine  11/22/2021*  . COVID-19 Vaccine (4 - Booster for Moderna series) 01/10/2021  . Flu Shot  Completed  . Pneumonia vaccines  Completed  . HPV Vaccine  Aged Out  .  Hepatitis C: One time screening is recommended by Center for Disease Control  (CDC) for  adults born from [birth year] through 1963.   Discontinued  *Topic was postponed. The date shown is not the original due date.     Immunizations Immunization History  Administered Date(s) Administered  . Fluad Quad(high Dose 65+) 06/20/2019, 06/15/2020  . Influenza, High Dose Seasonal PF 06/09/2016, 06/10/2017, 06/15/2018  . Influenza,inj,Quad PF,6+ Mos 06/21/2013, 05/05/2014, 05/09/2015  . Influenza-Unspecified 06/01/2015  . Moderna Sars-Covid-2 Vaccination 09/16/2019, 10/14/2019, 07/13/2020  . Pneumococcal Conjugate-13 11/01/2013  . Pneumococcal Polysaccharide-23 06/10/2017   Keep all routine maintenance appointments.   Next scheduled lab 03/08/21 @ 8:45  Cpe 03/12/21 @ 10:00  Advanced directives: not yet completed  Conditions/risks identified: none new  Follow up in one year for your annual wellness visit.   Preventive Care 49 Years and Older, Male Preventive care refers to lifestyle choices and visits with your health care provider that can promote health and wellness. What does preventive care include?  A yearly physical exam. This is also called an annual well check.  Dental exams once or twice a year.  Routine eye exams. Ask your health care provider how often you  should have your eyes checked.  Personal lifestyle choices, including:  Daily care of your teeth and gums.  Regular physical activity.  Eating a healthy diet.  Avoiding tobacco and drug use.  Limiting alcohol use.  Practicing safe sex.  Taking low doses of aspirin every day.  Taking vitamin and mineral supplements as recommended by your health care provider. What happens during an annual well check? The services and screenings done by your health care provider during your annual well check will depend on your age, overall health, lifestyle risk factors, and family history of disease. Counseling  Your health care provider may ask you questions about your:  Alcohol use.  Tobacco use.  Drug use.  Emotional well-being.  Home and relationship well-being.  Sexual activity.  Eating habits.  History of falls.  Memory and ability to understand (cognition).  Work and work Statistician. Screening  You may have the following tests or measurements:  Height, weight, and BMI.  Blood pressure.  Lipid and cholesterol levels. These may be checked every 5 years, or more frequently if you are over 28 years old.  Skin check.  Lung cancer screening. You may have this screening every year starting at age 28 if you have a 30-pack-year history of smoking and currently smoke or have quit within the past 15 years.  Fecal occult blood test (FOBT) of the stool. You may have this test every year starting at age 25.  Flexible sigmoidoscopy or colonoscopy. You may have a sigmoidoscopy every 5 years or  a colonoscopy every 10 years starting at age 33.  Prostate cancer screening. Recommendations will vary depending on your family history and other risks.  Hepatitis C blood test.  Hepatitis B blood test.  Sexually transmitted disease (STD) testing.  Diabetes screening. This is done by checking your blood sugar (glucose) after you have not eaten for a while (fasting). You may have this  done every 1-3 years.  Abdominal aortic aneurysm (AAA) screening. You may need this if you are a current or former smoker.  Osteoporosis. You may be screened starting at age 74 if you are at high risk. Talk with your health care provider about your test results, treatment options, and if necessary, the need for more tests. Vaccines  Your health care provider may recommend certain vaccines, such as:  Influenza vaccine. This is recommended every year.  Tetanus, diphtheria, and acellular pertussis (Tdap, Td) vaccine. You may need a Td booster every 10 years.  Zoster vaccine. You may need this after age 8.  Pneumococcal 13-valent conjugate (PCV13) vaccine. One dose is recommended after age 6.  Pneumococcal polysaccharide (PPSV23) vaccine. One dose is recommended after age 77. Talk to your health care provider about which screenings and vaccines you need and how often you need them. This information is not intended to replace advice given to you by your health care provider. Make sure you discuss any questions you have with your health care provider. Document Released: 09/14/2015 Document Revised: 05/07/2016 Document Reviewed: 06/19/2015 Elsevier Interactive Patient Education  2017 [place] Prevention in the Home Falls can cause injuries. They can happen to people of all ages. There are many things you can do to make your home safe and to help prevent falls. What can I do on the outside of my home?  Regularly fix the edges of walkways and driveways and fix any cracks.  Remove anything that might make you trip as you walk through a door, such as a raised step or threshold.  Trim any bushes or trees on the path to your home.  Use bright outdoor lighting.  Clear any walking paths of anything that might make someone trip, such as rocks or tools.  Regularly check to see if handrails are loose or broken. Make sure that both sides of any steps have handrails.  Any raised  decks and porches should have guardrails on the edges.  Have any leaves, snow, or ice cleared regularly.  Use sand or salt on walking paths during winter.  Clean up any spills in your garage right away. This includes oil or grease spills. What can I do in the bathroom?  Use night lights.  Install grab bars by the toilet and in the tub and shower. Do not use towel bars as grab bars.  Use non-skid mats or decals in the tub or shower.  If you need to sit down in the shower, use a plastic, non-slip stool.  Keep the floor dry. Clean up any water that spills on the floor as soon as it happens.  Remove soap buildup in the tub or shower regularly.  Attach bath mats securely with double-sided non-slip rug tape.  Do not have throw rugs and other things on the floor that can make you trip. What can I do in the bedroom?  Use night lights.  Make sure that you have a light by your bed that is easy to reach.  Do not use any sheets or blankets that are too big for your bed.  They should not hang down onto the floor.  Have a firm chair that has side arms. You can use this for support while you get dressed.  Do not have throw rugs and other things on the floor that can make you trip. What can I do in the kitchen?  Clean up any spills right away.  Avoid walking on wet floors.  Keep items that you use a lot in easy-to-reach places.  If you need to reach something above you, use a strong step stool that has a grab bar.  Keep electrical cords out of the way.  Do not use floor polish or wax that makes floors slippery. If you must use wax, use non-skid floor wax.  Do not have throw rugs and other things on the floor that can make you trip. What can I do with my stairs?  Do not leave any items on the stairs.  Make sure that there are handrails on both sides of the stairs and use them. Fix handrails that are broken or loose. Make sure that handrails are as long as the stairways.  Check  any carpeting to make sure that it is firmly attached to the stairs. Fix any carpet that is loose or worn.  Avoid having throw rugs at the top or bottom of the stairs. If you do have throw rugs, attach them to the floor with carpet tape.  Make sure that you have a light switch at the top of the stairs and the bottom of the stairs. If you do not have them, ask someone to add them for you. What else can I do to help prevent falls?  Wear shoes that:  Do not have high heels.  Have rubber bottoms.  Are comfortable and fit you well.  Are closed at the toe. Do not wear sandals.  If you use a stepladder:  Make sure that it is fully opened. Do not climb a closed stepladder.  Make sure that both sides of the stepladder are locked into place.  Ask someone to hold it for you, if possible.  Clearly mark and make sure that you can see:  Any grab bars or handrails.  First and last steps.  Where the edge of each step is.  Use tools that help you move around (mobility aids) if they are needed. These include:  Canes.  Walkers.  Scooters.  Crutches.  Turn on the lights when you go into a dark area. Replace any light bulbs as soon as they burn out.  Set up your furniture so you have a clear path. Avoid moving your furniture around.  If any of your floors are uneven, fix them.  If there are any pets around you, be aware of where they are.  Review your medicines with your doctor. Some medicines can make you feel dizzy. This can increase your chance of falling. Ask your doctor what other things that you can do to help prevent falls. This information is not intended to replace advice given to you by your health care provider. Make sure you discuss any questions you have with your health care provider. Document Released: 06/14/2009 Document Revised: 01/24/2016 Document Reviewed: 09/22/2014 Elsevier Interactive Patient Education  2017 [employer].

## 2020-12-28 ENCOUNTER — Other Ambulatory Visit: Payer: Self-pay | Admitting: Internal Medicine

## 2021-01-07 DIAGNOSIS — D2261 Melanocytic nevi of right upper limb, including shoulder: Secondary | ICD-10-CM | POA: Diagnosis not present

## 2021-01-07 DIAGNOSIS — Z85828 Personal history of other malignant neoplasm of skin: Secondary | ICD-10-CM | POA: Diagnosis not present

## 2021-01-07 DIAGNOSIS — D2272 Melanocytic nevi of left lower limb, including hip: Secondary | ICD-10-CM | POA: Diagnosis not present

## 2021-01-07 DIAGNOSIS — D225 Melanocytic nevi of trunk: Secondary | ICD-10-CM | POA: Diagnosis not present

## 2021-01-07 DIAGNOSIS — D2262 Melanocytic nevi of left upper limb, including shoulder: Secondary | ICD-10-CM | POA: Diagnosis not present

## 2021-01-07 DIAGNOSIS — L570 Actinic keratosis: Secondary | ICD-10-CM | POA: Diagnosis not present

## 2021-02-05 DIAGNOSIS — H353132 Nonexudative age-related macular degeneration, bilateral, intermediate dry stage: Secondary | ICD-10-CM | POA: Diagnosis not present

## 2021-02-05 DIAGNOSIS — Z0100 Encounter for examination of eyes and vision without abnormal findings: Secondary | ICD-10-CM | POA: Diagnosis not present

## 2021-02-15 ENCOUNTER — Other Ambulatory Visit: Payer: Self-pay | Admitting: Internal Medicine

## 2021-03-08 ENCOUNTER — Other Ambulatory Visit: Payer: Self-pay | Admitting: Internal Medicine

## 2021-03-08 ENCOUNTER — Other Ambulatory Visit: Payer: Self-pay

## 2021-03-08 ENCOUNTER — Other Ambulatory Visit (INDEPENDENT_AMBULATORY_CARE_PROVIDER_SITE_OTHER): Payer: Medicare PPO

## 2021-03-08 DIAGNOSIS — Z125 Encounter for screening for malignant neoplasm of prostate: Secondary | ICD-10-CM

## 2021-03-08 DIAGNOSIS — I10 Essential (primary) hypertension: Secondary | ICD-10-CM

## 2021-03-08 DIAGNOSIS — E7800 Pure hypercholesterolemia, unspecified: Secondary | ICD-10-CM | POA: Diagnosis not present

## 2021-03-08 DIAGNOSIS — R739 Hyperglycemia, unspecified: Secondary | ICD-10-CM | POA: Diagnosis not present

## 2021-03-08 LAB — CBC WITH DIFFERENTIAL/PLATELET
Basophils Absolute: 0 10*3/uL (ref 0.0–0.1)
Basophils Relative: 0.4 % (ref 0.0–3.0)
Eosinophils Absolute: 0.1 10*3/uL (ref 0.0–0.7)
Eosinophils Relative: 1.8 % (ref 0.0–5.0)
HCT: 43.4 % (ref 39.0–52.0)
Hemoglobin: 14.8 g/dL (ref 13.0–17.0)
Lymphocytes Relative: 34.2 % (ref 12.0–46.0)
Lymphs Abs: 2.6 10*3/uL (ref 0.7–4.0)
MCHC: 34.1 g/dL (ref 30.0–36.0)
MCV: 86.4 fl (ref 78.0–100.0)
Monocytes Absolute: 0.8 10*3/uL (ref 0.1–1.0)
Monocytes Relative: 10.3 % (ref 3.0–12.0)
Neutro Abs: 4 10*3/uL (ref 1.4–7.7)
Neutrophils Relative %: 53.3 % (ref 43.0–77.0)
Platelets: 236 10*3/uL (ref 150.0–400.0)
RBC: 5.02 Mil/uL (ref 4.22–5.81)
RDW: 12.9 % (ref 11.5–15.5)
WBC: 7.5 10*3/uL (ref 4.0–10.5)

## 2021-03-08 LAB — BASIC METABOLIC PANEL
BUN: 18 mg/dL (ref 6–23)
CO2: 28 mEq/L (ref 19–32)
Calcium: 9.1 mg/dL (ref 8.4–10.5)
Chloride: 105 mEq/L (ref 96–112)
Creatinine, Ser: 0.87 mg/dL (ref 0.40–1.50)
GFR: 82.78 mL/min (ref >60.00)
Glucose, Bld: 84 mg/dL (ref 70–99)
Potassium: 4.1 mEq/L (ref 3.5–5.1)
Sodium: 139 mEq/L (ref 135–145)

## 2021-03-08 LAB — HEPATIC FUNCTION PANEL
ALT: 19 U/L (ref 0–53)
AST: 17 U/L (ref 0–37)
Albumin: 4.3 g/dL (ref 3.5–5.2)
Alkaline Phosphatase: 53 U/L (ref 39–117)
Bilirubin, Direct: 0.2 mg/dL (ref 0.0–0.3)
Total Bilirubin: 0.8 mg/dL (ref 0.2–1.2)
Total Protein: 6.5 g/dL (ref 6.0–8.3)

## 2021-03-08 LAB — PSA, MEDICARE: PSA: 0.01 ng/ml — ABNORMAL LOW (ref 0.10–4.00)

## 2021-03-08 LAB — LIPID PANEL
Cholesterol: 125 mg/dL (ref 0–200)
HDL: 49.8 mg/dL (ref >39.00)
LDL Cholesterol: 60 mg/dL (ref 0–99)
NonHDL: 74.79
Total CHOL/HDL Ratio: 3
Triglycerides: 76 mg/dL (ref 0.0–149.0)
VLDL: 15.2 mg/dL (ref 0.0–40.0)

## 2021-03-08 LAB — TSH: TSH: 2.01 u[IU]/mL (ref 0.35–5.50)

## 2021-03-08 LAB — HEMOGLOBIN A1C: Hgb A1c MFr Bld: 5.6 % (ref 4.6–6.5)

## 2021-03-12 ENCOUNTER — Ambulatory Visit (INDEPENDENT_AMBULATORY_CARE_PROVIDER_SITE_OTHER): Payer: Medicare PPO | Admitting: Internal Medicine

## 2021-03-12 ENCOUNTER — Other Ambulatory Visit: Payer: Self-pay

## 2021-03-12 ENCOUNTER — Encounter: Payer: Self-pay | Admitting: Internal Medicine

## 2021-03-12 VITALS — BP 126/78 | HR 66 | Temp 98.2°F | Ht 75.98 in | Wt 235.8 lb

## 2021-03-12 DIAGNOSIS — I10 Essential (primary) hypertension: Secondary | ICD-10-CM

## 2021-03-12 DIAGNOSIS — E7800 Pure hypercholesterolemia, unspecified: Secondary | ICD-10-CM | POA: Diagnosis not present

## 2021-03-12 DIAGNOSIS — K219 Gastro-esophageal reflux disease without esophagitis: Secondary | ICD-10-CM | POA: Diagnosis not present

## 2021-03-12 DIAGNOSIS — M545 Low back pain, unspecified: Secondary | ICD-10-CM | POA: Diagnosis not present

## 2021-03-12 DIAGNOSIS — Z8546 Personal history of malignant neoplasm of prostate: Secondary | ICD-10-CM

## 2021-03-12 DIAGNOSIS — I471 Supraventricular tachycardia: Secondary | ICD-10-CM

## 2021-03-12 DIAGNOSIS — R739 Hyperglycemia, unspecified: Secondary | ICD-10-CM | POA: Diagnosis not present

## 2021-03-12 DIAGNOSIS — Z0000 Encounter for general adult medical examination without abnormal findings: Secondary | ICD-10-CM

## 2021-03-12 NOTE — Progress Notes (Signed)
Patient ID: Cainan Trull, male   DOB: [birth year], 80 y.o.   MRN: [id number]   Subjective:    Patient ID: Othniel Maret, male    DOB: 1941-08-17, 80 y.o.   MRN: [id number]  HPI This visit occurred during the SARS-CoV-2 public health emergency.  Safety protocols were in place, including screening questions prior to the visit, additional usage of staff PPE, and extensive cleaning of exam room while observing appropriate contact time as indicated for disinfecting solutions.   Patient here for his physical exam. He reports he is doing well.  Tries to stay active.  No chest pain or sob reported.  Feels from a heart standpoint - stable. No significant increased heart rate or palpitations.  No acid reflux or abdominal pain reported.  Bowels stable.  Some low back discomfort at times, but overall feels he is doing well.  Desires no further intervention for his back.  No radicular symptoms.    Past Medical History:  Diagnosis Date   Atrial fibrillation [facility])    Diverticulosis    Dysrhythmia    GERD (gastroesophageal reflux disease)    Hypercholesterolemia    Hypertension    Prostate cancer [facility])    s/p radical retropubic prostatectomy 1999   Spinal stenosis    Past Surgical History:  Procedure Laterality Date   BIOPSY  05/19/2019   Procedure: BIOPSY;  Surgeon: Laurence Spates, MD;  Location: [facility];  Service: Endoscopy;;   COLONOSCOPY WITH PROPOFOL N/A 05/19/2019   Procedure: COLONOSCOPY WITH PROPOFOL;  Surgeon: Laurence Spates, MD;  Location: [facility];  Service: Endoscopy;  Laterality: N/A;   ESOPHAGOGASTRODUODENOSCOPY (EGD) WITH PROPOFOL N/A 05/19/2019   Procedure: ESOPHAGOGASTRODUODENOSCOPY (EGD) WITH PROPOFOL;  Surgeon: Laurence Spates, MD;  Location: [facility];  Service: Endoscopy;  Laterality: N/A;   FOLEY CATHETER     INGUINAL HERNIA REPAIR  1945   INGUINAL HERNIA REPAIR Left 12/04/2015   Procedure: HERNIA REPAIR INGUINAL ADULT;  Surgeon: Leonie Green, MD;  Location: [facility]  ORS;  Service: General;  Laterality: Left;   LUMBAR FUSION     L3-4 fusion   pilonidal cyst removal  1969   POLYPECTOMY  05/19/2019   Procedure: POLYPECTOMY;  Surgeon: Laurence Spates, MD;  Location: [facility];  Service: Endoscopy;;   RETROPUBIC PROSTATECTOMY     radical (1999)   [place]   Family History  Problem Relation Age of Onset   Arthritis Mother    Dementia Mother    AAA (abdominal aortic aneurysm) Father    Nephrolithiasis Other    Colon cancer Neg Hx    Social History   Socioeconomic History   Marital status: Married    Spouse name: Not on file   Number of children: 3   Years of education: Not on file   Highest education level: Not on file  Occupational History   Not on file  Tobacco Use   Smoking status: Former    Types: Cigarettes    Quit date: 11/28/1973    Years since quitting: 47.3   Smokeless tobacco: Never  Vaping Use   Vaping Use: Never used  Substance and Sexual Activity   Alcohol use: Yes    Alcohol/week: 0.0 standard drinks    Comment: rarely   Drug use: No   Sexual activity: Not on file  Other Topics Concern   Not on file  Social History Narrative   Not on file   Social Determinants of Health   Financial Resource Strain: Low Risk  Difficulty of Paying Living Expenses: Not hard at all  Food Insecurity: No Food Insecurity   Worried About [place] in the Last Year: Never true   Ran Out of Food in the Last Year: Never true  Transportation Needs: No Transportation Needs   Lack of Transportation (Medical): No   Lack of Transportation (Non-Medical): No  Physical Activity: Not on file  Stress: No Stress Concern Present   Feeling of Stress : Not at all  Social Connections: Unknown   Frequency of Communication with Friends and Family: Not on file   Frequency of Social Gatherings with Friends and Family: Not on file   Attends Religious Services: Not on file   Active Member of Clubs or Organizations:  Not on file   Attends Archivist Meetings: Not on file   Marital Status: Married    Review of Systems  Constitutional:  Negative for fatigue and unexpected weight change.  HENT:  Negative for congestion, sinus pressure and sore throat.   Eyes:  Negative for pain and visual disturbance.  Respiratory:  Negative for cough, chest tightness and shortness of breath.   Cardiovascular:  Negative for chest pain and palpitations.  Gastrointestinal:  Negative for abdominal pain, diarrhea, nausea and vomiting.  Genitourinary:  Negative for difficulty urinating and frequency.  Musculoskeletal:  Negative for joint swelling and myalgias.  Skin:  Negative for color change and rash.  Neurological:  Negative for dizziness, light-headedness and headaches.  Hematological:  Negative for adenopathy. Does not bruise/bleed easily.  Psychiatric/Behavioral:  Negative for agitation and dysphoric mood.       Objective:    Physical Exam Constitutional:      General: He is not in acute distress.    Appearance: Normal appearance. He is well-developed.  HENT:     Head: Normocephalic and atraumatic.     Right Ear: External ear normal.     Left Ear: External ear normal.  Eyes:     General: No scleral icterus.       Right eye: No discharge.        Left eye: No discharge.     Conjunctiva/sclera: Conjunctivae normal.  Neck:     Thyroid: No thyromegaly.  Cardiovascular:     Rate and Rhythm: Normal rate and regular rhythm.  Pulmonary:     Effort: No respiratory distress.     Breath sounds: Normal breath sounds. No wheezing.  Abdominal:     General: Bowel sounds are normal.     Palpations: Abdomen is soft.     Tenderness: There is no abdominal tenderness.  Musculoskeletal:        General: No swelling or tenderness.     Cervical back: Neck supple. No tenderness.  Lymphadenopathy:     Cervical: No cervical adenopathy.  Skin:    Findings: No erythema or rash.  Neurological:     Mental Status: He  is alert and oriented to person, place, and time.  Psychiatric:        Mood and Affect: Mood normal.        Behavior: Behavior normal.    BP 126/78   Pulse 66   Temp 98.2 F (36.8 C)   Ht 6' 3.98" (1.93 m)   Wt 235 lb 12.8 oz (107 kg)   SpO2 98%   BMI 28.71 kg/m  Wt Readings from Last 3 Encounters:  03/12/21 235 lb 12.8 oz (107 kg)  11/22/20 243 lb (110.2 kg)  11/08/20 243 lb 9.6 oz (110.5  kg)    Outpatient Encounter Medications as of 03/12/2021  Medication Sig   aspirin 81 MG tablet Take 81 mg by mouth at bedtime.    diltiazem (CARDIZEM CD) 120 MG 24 hr capsule TAKE 1 CAPSULE BY MOUTH DAILY   lisinopril (ZESTRIL) 20 MG tablet TAKE 1 TABLET BY MOUTH DAILY   Multiple Vitamins-Minerals (PRESERVISION AREDS PO) Take 1 tablet by mouth daily.   Omega-3 Fatty Acids (FISH OIL) 1200 MG CAPS Take 1,200 mg by mouth 2 (two) times daily.   pantoprazole (PROTONIX) 40 MG tablet TAKE 1 TABLET BY MOUTH DAILY   Probiotic Product (ALIGN) 4 MG CAPS Take 4 mg by mouth every evening.   rosuvastatin (CRESTOR) 10 MG tablet TAKE 1 TABLET BY MOUTH DAILY   triamcinolone (NASACORT) 55 MCG/ACT AERO nasal inhaler Place 2 sprays into the nose at bedtime.   triamterene-hydrochlorothiazide (MAXZIDE-25) 37.5-25 MG tablet TAKE 1/2 TABLET BY MOUTH ONCE A DAY   No facility-administered encounter medications on file as of 03/12/2021.     Lab Results  Component Value Date   WBC 7.5 03/08/2021   HGB 14.8 03/08/2021   HCT 43.4 03/08/2021   PLT 236.0 03/08/2021   GLUCOSE 84 03/08/2021   CHOL 125 03/08/2021   TRIG 76.0 03/08/2021   HDL 49.80 03/08/2021   LDLCALC 60 03/08/2021   ALT 19 03/08/2021   AST 17 03/08/2021   NA 139 03/08/2021   K 4.1 03/08/2021   CL 105 03/08/2021   CREATININE 0.87 03/08/2021   BUN 18 03/08/2021   CO2 28 03/08/2021   TSH 2.01 03/08/2021   PSA 0.01 (L) 03/08/2021   HGBA1C 5.6 03/08/2021       Assessment & Plan:   Problem List Items Addressed This Visit     Back pain     Reports intermittent flares.  No significant pain.  Desires no further intervention.  Follow.        GERD (gastroesophageal reflux disease)    No upper symptoms reported.  On protonix.        Health care maintenance    Physical today 03/12/21.  Colonoscopy 05/2019 - inflmmatory polyp.  No f/u needed.  PSA 03/08/21 - .01.         History of prostate cancer    PSA just checked 03/08/21 - .01.         Hypercholesterolemia    Continue crestor.  Low cholesterol diet and exercise.  Follow lipid panel and liver function tests.        Hyperglycemia    Low carb diet and exercise.  Follow met b and a1c.        Hypertension    Continue triam/hctz, diltiazem and lisinopril.  Blood pressures doing well.  Reviewed outside blood pressures.  Most averaging - 120s/70s.  Continue current medication.  Follow pressures.  Follow metabolic panel.        SVT (supraventricular tachycardia) (HCC)    Continue diltiazem.  Doing well.  Stable.  Followed by Dr Caryl Comes.        Other Visit Diagnoses     Routine general medical examination at a health care facility    -  Primary        Einar Pheasant, MD

## 2021-03-12 NOTE — Assessment & Plan Note (Signed)
Physical today 03/12/21.  Colonoscopy 05/2019 - inflmmatory polyp.  No f/u needed.  PSA 03/08/21 - .01.

## 2021-03-18 ENCOUNTER — Encounter: Payer: Self-pay | Admitting: Internal Medicine

## 2021-03-18 NOTE — Assessment & Plan Note (Signed)
Continue triam/hctz, diltiazem and lisinopril.  Blood pressures doing well.  Reviewed outside blood pressures.  Most averaging - 120s/70s.  Continue current medication.  Follow pressures.  Follow metabolic panel.

## 2021-03-18 NOTE — Assessment & Plan Note (Signed)
Reports intermittent flares.  No significant pain.  Desires no further intervention.  Follow.

## 2021-03-18 NOTE — Assessment & Plan Note (Signed)
Low carb diet and exercise.  Follow met b and a1c.  

## 2021-03-18 NOTE — Assessment & Plan Note (Signed)
Continue crestor.  Low cholesterol diet and exercise. Follow lipid panel and liver function tests.   

## 2021-03-18 NOTE — Assessment & Plan Note (Signed)
PSA just checked 03/08/21 - .01.

## 2021-03-18 NOTE — Assessment & Plan Note (Signed)
No upper symptoms reported.  On protonix.   

## 2021-03-18 NOTE — Assessment & Plan Note (Signed)
Continue diltiazem.  Doing well.  Stable.  Followed by Dr Caryl Comes.

## 2021-03-29 ENCOUNTER — Other Ambulatory Visit: Payer: Self-pay | Admitting: Internal Medicine

## 2021-03-29 MED ORDER — DILTIAZEM HCL ER COATED BEADS 120 MG PO CP24: 120.0000 mg | ORAL_CAPSULE | Freq: Every day | ORAL | 0 refills | Status: DC

## 2021-03-29 NOTE — Telephone Encounter (Signed)
*  STAT* If patient is at the pharmacy, call can be transferred to refill team.   1. Which medications need to be refilled? (please list name of each medication and dose if known) Diltiazem  2. Which pharmacy/location (including street and city if local pharmacy) is medication to be sent to? [facility]  3. Do they need a 30 day or 90 day supply? Paitent needs enough to get him to his appointment on 10/4

## 2021-03-29 NOTE — Telephone Encounter (Signed)
diltiazem (CARDIZEM CD) 120 MG 24 hr capsule 60 capsule 0 03/29/2021    Sig - Route: Take 1 capsule (120 mg total) by mouth daily. PLEASE CONTACT OFFICE FOR A FOLLOW UP APPOINTMENT. FIRST ATTEMPT - Oral   Sent to pharmacy as: diltiazem (CARDIZEM CD) 120 MG 24 hr capsule   E-Prescribing Status: Receipt confirmed by pharmacy (03/29/2021  3:35 PM EDT)     [place], [place]

## 2021-05-03 ENCOUNTER — Other Ambulatory Visit: Payer: Self-pay | Admitting: Internal Medicine

## 2021-06-03 ENCOUNTER — Telehealth: Payer: Self-pay | Admitting: Internal Medicine

## 2021-06-03 NOTE — Telephone Encounter (Signed)
Patients wife is calling in to see if they can pick up a copy of his labs PSA labs from 03/08/21 ton Wednesday to give to his cancer insurance company.She also stated that if they are not available for pick up on Wednesday she is requesting that they are mailed to their house.Please advise.

## 2021-06-04 ENCOUNTER — Other Ambulatory Visit: Payer: Self-pay

## 2021-06-04 ENCOUNTER — Encounter: Payer: Self-pay | Admitting: Internal Medicine

## 2021-06-04 ENCOUNTER — Ambulatory Visit: Payer: Medicare PPO | Admitting: Internal Medicine

## 2021-06-04 VITALS — BP 128/86 | HR 63 | Ht 76.0 in | Wt 230.0 lb

## 2021-06-04 DIAGNOSIS — R001 Bradycardia, unspecified: Secondary | ICD-10-CM | POA: Diagnosis not present

## 2021-06-04 DIAGNOSIS — I10 Essential (primary) hypertension: Secondary | ICD-10-CM | POA: Diagnosis not present

## 2021-06-04 DIAGNOSIS — I471 Supraventricular tachycardia: Secondary | ICD-10-CM

## 2021-06-04 DIAGNOSIS — I493 Ventricular premature depolarization: Secondary | ICD-10-CM

## 2021-06-04 MED ORDER — DILTIAZEM HCL ER COATED BEADS 180 MG PO CP24: 180.0000 mg | ORAL_CAPSULE | Freq: Every day | ORAL | 1 refills | Status: DC

## 2021-06-04 NOTE — Telephone Encounter (Signed)
Spoken to patient. labs placed up front for pick up.

## 2021-06-04 NOTE — Progress Notes (Signed)
Patient Care Team: Einar Pheasant, MD as PCP - General (Internal Medicine) Deboraha Sprang, MD as PCP - Electrophysiology (Cardiology) Wellington Hampshire, MD as PCP - Cardiology (Cardiology)   HPI  Edward James is a 80 y.o. male is seen in followup for syncope and previously identified SVT, frequently occurring and thought to AVNRT. Not assoc  Previously seen 12/19 for SVT identified on a monitor.  Frequent episodes were noted.  No associated symptoms and so no therapy was indicated.  He describes an episode in March of becoming quite anxious while he was standing accompanied by lightheadedness.  No associated palpitations.  He has noted this anxiety before and since.  A couple of weeks ago he was out working in the yard in the heat.  A number of hours.  Went to [employer] to watch a baseball game with his son and was sitting in the center field grass.  Became anxious again and somewhat lightheaded.  No palpitations.  Walked to his car with his daughter when he passed out.  Unconscious for a couple of minutes.  Residual fatigue and orthostatic intolerance.  His heart rate monitor which he wears on his watch detected a heart rate of 160.  Notably, this is the rate of his SVT as detected previously.  It was elected to treat him medically with diltiazem  In the interim he has had recurrent SVT, with about 4 episodes over the last couple of weeks but this is a significant flurry.  There are much less symptomatic than previously typically lasting 15-20 minutes with some lightheadedness and shortness of breath.  Out of the context of the tachycardia, the patient denies chest pain, shortness of breath, nocturnal dyspnea, orthopnea or peripheral edema.  There have been no   syncope.   Tolerating meds  Records and Results Reviewed   Past Medical History:  Diagnosis Date   Atrial fibrillation ([place])    Diverticulosis    Dysrhythmia    GERD (gastroesophageal reflux disease)     Hypercholesterolemia    Hypertension    Prostate cancer [facility])    s/p radical retropubic prostatectomy 1999   Spinal stenosis     Past Surgical History:  Procedure Laterality Date   BIOPSY  05/19/2019   Procedure: BIOPSY;  Surgeon: Laurence Spates, MD;  [facility];  Service: Endoscopy;;   COLONOSCOPY WITH PROPOFOL N/A 05/19/2019   Procedure: COLONOSCOPY WITH PROPOFOL;  Surgeon: Laurence Spates, MD;  [facility];  Service: Endoscopy;  Laterality: N/A;   ESOPHAGOGASTRODUODENOSCOPY (EGD) WITH PROPOFOL N/A 05/19/2019   Procedure: ESOPHAGOGASTRODUODENOSCOPY (EGD) WITH PROPOFOL;  Surgeon: Laurence Spates, MD;  [facility];  Service: Endoscopy;  Laterality: N/A;   FOLEY CATHETER     INGUINAL HERNIA REPAIR  1945   INGUINAL HERNIA REPAIR Left 12/04/2015   Procedure: HERNIA REPAIR INGUINAL ADULT;  Surgeon: Leonie Green, MD;  Location: [facility] ORS;  Service: General;  Laterality: Left;   LUMBAR FUSION     L3-4 fusion   pilonidal cyst removal  1969   POLYPECTOMY  05/19/2019   Procedure: POLYPECTOMY;  Surgeon: Laurence Spates, MD;  [facility];  Service: Endoscopy;;   RETROPUBIC PROSTATECTOMY     radical (1999)   TONSILLECTOMY AND ADENOIDECTOMY  1948    Current Meds  Medication Sig   aspirin 81 MG tablet Take 81 mg by mouth at bedtime.    diltiazem (CARDIZEM CD) 120 MG 24 hr capsule Take 1 capsule (120 mg total) by mouth  daily. PLEASE CONTACT OFFICE FOR A FOLLOW UP APPOINTMENT. FIRST ATTEMPT   lisinopril (ZESTRIL) 20 MG tablet TAKE 1 TABLET BY MOUTH DAILY   Multiple Vitamins-Minerals (PRESERVISION AREDS PO) Take 1 tablet by mouth daily.   Omega-3 Fatty Acids (FISH OIL) 1200 MG CAPS Take 1,200 mg by mouth 2 (two) times daily.   pantoprazole (PROTONIX) 40 MG tablet TAKE 1 TABLET BY MOUTH DAILY   Probiotic Product (ALIGN) 4 MG CAPS Take 4 mg by mouth every evening.   rosuvastatin (CRESTOR) 10 MG tablet TAKE 1 TABLET BY MOUTH DAILY   triamcinolone  (NASACORT) 55 MCG/ACT AERO nasal inhaler Place 2 sprays into the nose at bedtime.   triamterene-hydrochlorothiazide (MAXZIDE-25) 37.5-25 MG tablet TAKE 1/2 TABLET BY MOUTH ONCE A DAY    No Known Allergies    Review of Systems negative except from HPI and PMH  Physical Exam BP 128/86 (BP Location: Left Arm, Patient Position: Sitting, Cuff Size: Normal)   Pulse 63   Ht 6\' 4"  (1.93 m)   Wt 230 lb (104.3 kg)   SpO2 98%   BMI 28.00 kg/m  Well developed and nourished in no acute distress HENT normal Neck supple with JVP-  flat  Clear Regular rate and rhythm, no murmurs or gallops Abd-soft with active BS No Clubbing cyanosis edema Skin-warm and dry A & Oriented  Grossly normal sensory and motor function  ECG   Sinus at 63 Interval 20/11/42 Axis left -48  Assessment and  Plan SVT   Bradycardia-nocturnal  Syncope  Hypertension  PVCs  Anxiety    No intercurrent syncope.  Recurrent intercurrent tachy palpitations.  This despite diltiazem and increasingly frequent of late.  Less symptomatic than previously.  Lengthy discussion regarding treatment options.  My concern is with a prior history of associated syncope then we should be more aggressive, his inclination is to be less so.  The compromise was for [place] to increase his diltiazem from 120--180 and then give it for 5 weeks to see how he does.  I have scheduled him to see Dr. Quentin Ore at that time to discuss catheter ablation in the event that the tachycardia is persisting.  With his blood pressure under reasonable control, we will continue his on his lisinopril, increase diltiazem but to mitigate its effect we will discontinue his triamterene hydrochlorothiazide         Current medicines are reviewed at length with the patient today .  The patient does not  have concerns regarding medicines.

## 2021-06-04 NOTE — Patient Instructions (Addendum)
Medication Instructions:  - Your physician has recommended you make the following change in your medication:   1) INCREASE cardizem (diltiazem) to 180 mg: - take 1 capsule by mouth once daily   2) STOP maxide (triameterene-HCTZ)  *If you need a refill on your cardiac medications before your next appointment, please call your pharmacy*   Lab Work: - none ordered  If you have labs (blood work) drawn today and your tests are completely normal, you will receive your results only by: [place] (if you have MyChart) OR A paper copy in the mail If you have any lab test that is abnormal or we need to change your treatment, we will call you to review the results.   Testing/Procedures: - You have been referred to : Dr. Lars Mage (in 6 weeks) for consideration of SVT ablation    Follow-Up: At [facility], you and your health needs are our priority.  As part of our continuing mission to provide you with exceptional heart care, we have created designated Provider Care Teams.  These Care Teams include your primary Cardiologist (physician) and Advanced Practice Providers (APPs -  Physician Assistants and Nurse Practitioners) who all work together to provide you with the care you need, when you need it.  We recommend signing up for the patient portal called "MyChart".  Sign up information is provided on this After Visit Summary.  MyChart is used to connect with patients for Virtual Visits (Telemedicine).  Patients are able to view lab/test results, encounter notes, upcoming appointments, etc.  Non-urgent messages can be sent to your provider as well.   To learn more about what you can do with MyChart, go to [email or web address].    Your next appointment:   Pending your evaluation with Dr. Quentin Ore   The format for your next appointment:   In Person  Provider:   Virl Axe, MD   Other Instructions N/a

## 2021-06-12 ENCOUNTER — Other Ambulatory Visit: Payer: Self-pay

## 2021-06-12 ENCOUNTER — Ambulatory Visit (INDEPENDENT_AMBULATORY_CARE_PROVIDER_SITE_OTHER): Payer: Medicare PPO

## 2021-06-12 DIAGNOSIS — Z23 Encounter for immunization: Secondary | ICD-10-CM

## 2021-06-20 ENCOUNTER — Other Ambulatory Visit: Payer: Self-pay | Admitting: Internal Medicine

## 2021-07-08 ENCOUNTER — Telehealth: Payer: Self-pay | Admitting: Internal Medicine

## 2021-07-08 NOTE — Telephone Encounter (Signed)
Patient has a lab appt 07/12/2021, there are no orders in

## 2021-07-09 ENCOUNTER — Other Ambulatory Visit: Payer: Self-pay | Admitting: Internal Medicine

## 2021-07-09 DIAGNOSIS — I4891 Unspecified atrial fibrillation: Secondary | ICD-10-CM

## 2021-07-09 DIAGNOSIS — I10 Essential (primary) hypertension: Secondary | ICD-10-CM

## 2021-07-09 DIAGNOSIS — R739 Hyperglycemia, unspecified: Secondary | ICD-10-CM

## 2021-07-09 DIAGNOSIS — Z8546 Personal history of malignant neoplasm of prostate: Secondary | ICD-10-CM

## 2021-07-09 DIAGNOSIS — E7800 Pure hypercholesterolemia, unspecified: Secondary | ICD-10-CM

## 2021-07-09 NOTE — Progress Notes (Signed)
Orders placed for f/u labs.  

## 2021-07-09 NOTE — Telephone Encounter (Signed)
Orders placed for f/u labs.  

## 2021-07-12 ENCOUNTER — Other Ambulatory Visit (INDEPENDENT_AMBULATORY_CARE_PROVIDER_SITE_OTHER): Payer: Medicare PPO

## 2021-07-12 ENCOUNTER — Other Ambulatory Visit: Payer: Self-pay

## 2021-07-12 DIAGNOSIS — R739 Hyperglycemia, unspecified: Secondary | ICD-10-CM | POA: Diagnosis not present

## 2021-07-12 DIAGNOSIS — Z8546 Personal history of malignant neoplasm of prostate: Secondary | ICD-10-CM

## 2021-07-12 DIAGNOSIS — E7800 Pure hypercholesterolemia, unspecified: Secondary | ICD-10-CM | POA: Diagnosis not present

## 2021-07-12 DIAGNOSIS — I10 Essential (primary) hypertension: Secondary | ICD-10-CM

## 2021-07-12 LAB — HEPATIC FUNCTION PANEL
ALT: 14 U/L (ref 0–53)
AST: 17 U/L (ref 0–37)
Albumin: 4.4 g/dL (ref 3.5–5.2)
Alkaline Phosphatase: 51 U/L (ref 39–117)
Bilirubin, Direct: 0.2 mg/dL (ref 0.0–0.3)
Total Bilirubin: 0.8 mg/dL (ref 0.2–1.2)
Total Protein: 6.6 g/dL (ref 6.0–8.3)

## 2021-07-12 LAB — BASIC METABOLIC PANEL
BUN: 18 mg/dL (ref 6–23)
CO2: 28 mEq/L (ref 19–32)
Calcium: 9.1 mg/dL (ref 8.4–10.5)
Chloride: 107 mEq/L (ref 96–112)
Creatinine, Ser: 0.81 mg/dL (ref 0.40–1.50)
GFR: 84.39 mL/min (ref >60.00)
Glucose, Bld: 90 mg/dL (ref 70–99)
Potassium: 4 mEq/L (ref 3.5–5.1)
Sodium: 141 mEq/L (ref 135–145)

## 2021-07-12 LAB — LIPID PANEL
Cholesterol: 124 mg/dL (ref 0–200)
HDL: 54.2 mg/dL (ref >39.00)
LDL Cholesterol: 59 mg/dL (ref 0–99)
NonHDL: 70.21
Total CHOL/HDL Ratio: 2
Triglycerides: 55 mg/dL (ref 0.0–149.0)
VLDL: 11 mg/dL (ref 0.0–40.0)

## 2021-07-12 LAB — PSA, MEDICARE: PSA: 0.01 ng/ml — ABNORMAL LOW (ref 0.10–4.00)

## 2021-07-12 LAB — HEMOGLOBIN A1C: Hgb A1c MFr Bld: 5.7 % (ref 4.6–6.5)

## 2021-07-15 ENCOUNTER — Ambulatory Visit: Payer: Medicare PPO | Admitting: Internal Medicine

## 2021-07-16 ENCOUNTER — Ambulatory Visit: Payer: Medicare PPO | Admitting: Internal Medicine

## 2021-07-16 ENCOUNTER — Other Ambulatory Visit: Payer: Self-pay

## 2021-07-16 DIAGNOSIS — E7800 Pure hypercholesterolemia, unspecified: Secondary | ICD-10-CM

## 2021-07-16 DIAGNOSIS — I10 Essential (primary) hypertension: Secondary | ICD-10-CM

## 2021-07-16 DIAGNOSIS — I471 Supraventricular tachycardia: Secondary | ICD-10-CM | POA: Diagnosis not present

## 2021-07-16 DIAGNOSIS — R739 Hyperglycemia, unspecified: Secondary | ICD-10-CM

## 2021-07-16 DIAGNOSIS — R142 Eructation: Secondary | ICD-10-CM

## 2021-07-16 DIAGNOSIS — K219 Gastro-esophageal reflux disease without esophagitis: Secondary | ICD-10-CM

## 2021-07-16 DIAGNOSIS — Z8546 Personal history of malignant neoplasm of prostate: Secondary | ICD-10-CM

## 2021-07-16 NOTE — Progress Notes (Signed)
Patient ID: Edward James, male   DOB: May 13, 1941, 80 y.o.   MRN: [id number]   Subjective:    Patient ID: Edward James, male    DOB: [birth year], 80 y.o.   MRN: [id number]  This visit occurred during the SARS-CoV-2 public health emergency.  Safety protocols were in place, including screening questions prior to the visit, additional usage of staff PPE, and extensive cleaning of exam room while observing appropriate contact time as indicated for disinfecting solutions.   Patient here for a scheduled follow up.    Chief Complaint  Patient presents with   Hypertension   Hyperlipidemia   .   HPI Doing relatively well.  Saw Dr Caryl Comes.  Diltiazem increased from 165m to 1834m  Off triam/hctz.  Off aspirin.  Scheduled to f/u with Dr LaQuentin Oreomorrow.  No chest pain.  Breathing stable.  Blood pressures:  120-130/60-70.  Some burping in am.  No acid reflux.  Notices the burping 1-2 hours after eating.  May last 10-15 minutes.  No problems the remainder of the day.  No abdominal pain.  Bowels moving.     Past Medical History:  Diagnosis Date   [facility]   Diverticulosis    Dysrhythmia    GERD (gastroesophageal reflux disease)    Hypercholesterolemia    Hypertension    [facility]   s/p radical retropubic prostatectomy 1999   Spinal stenosis    Past Surgical History:  Procedure Laterality Date   BIOPSY  05/19/2019   Procedure: BIOPSY;  Surgeon: EdLaurence SpatesMD;  [facility];  Service: Endoscopy;;   COLONOSCOPY WITH PROPOFOL N/A 05/19/2019   Procedure: COLONOSCOPY WITH PROPOFOL;  Surgeon: EdLaurence SpatesMD;  [facility];  Service: Endoscopy;  Laterality: N/A;   ESOPHAGOGASTRODUODENOSCOPY (EGD) WITH PROPOFOL N/A 05/19/2019   Procedure: ESOPHAGOGASTRODUODENOSCOPY (EGD) WITH PROPOFOL;  Surgeon: EdLaurence SpatesMD;  [facility];  Service: Endoscopy;  Laterality: N/A;   FOLEY CATHETER     INGUINAL HERNIA REPAIR  1945   INGUINAL HERNIA  REPAIR Left 12/04/2015   Procedure: HERNIA REPAIR INGUINAL ADULT;  Surgeon: JaLeonie GreenMD;  Location: [facility];  Service: General;  Laterality: Left;   LUMBAR FUSION     L3-4 fusion   pilonidal cyst removal  1969   POLYPECTOMY  05/19/2019   Procedure: POLYPECTOMY;  Surgeon: EdLaurence SpatesMD;  [facility];  Service: Endoscopy;;   RETROPUBIC PROSTATECTOMY     radical (1999)   [place]  Problem Relation Age of Onset   Arthritis Mother    Dementia Mother    AAA (abdominal aortic aneurysm) Father    Nephrolithiasis Other    Colon cancer Neg Hx    Social History   Socioeconomic History   Marital status: Married    Spouse name: Not on file   Number of children: 3   Years of education: Not on file   Highest education level: Not on file  Occupational History   Not on file  Tobacco Use   Smoking status: Former    Types: Cigarettes    Quit date: 11/28/1973    Years since quitting: 47.6   Smokeless tobacco: Never  Vaping Use   Vaping Use: Never used  Substance and Sexual Activity   Alcohol use: Yes    Alcohol/week: 0.0 standard drinks    Comment: rarely   Drug use: No   Sexual activity: Not on file  Other Topics  Concern   Not on file  Social History Narrative   Not on file   Social Determinants of Health   Financial Resource Strain: Low Risk    Difficulty of Paying Living Expenses: Not hard at all  Food Insecurity: No Food Insecurity   Worried About Charity fundraiser in the Last Year: Never true   [place] in the Last Year: Never true  Transportation Needs: No Transportation Needs   Lack of Transportation (Medical): No   Lack of Transportation (Non-Medical): No  Physical Activity: Not on file  Stress: No Stress Concern Present   Feeling of Stress : Not at all  Social Connections: Unknown   Frequency of Communication with Friends and Family: Not on file   Frequency of Social Gatherings with  Friends and Family: Not on file   Attends Religious Services: Not on file   Active Member of Clubs or Organizations: Not on file   Attends Archivist Meetings: Not on file   Marital Status: Married     Review of Systems  Constitutional:  Negative for appetite change and unexpected weight change.  HENT:  Negative for congestion and sinus pressure.   Respiratory:  Negative for cough, chest tightness and shortness of breath.   Cardiovascular:  Negative for chest pain, palpitations and leg swelling.  Gastrointestinal:  Negative for abdominal pain, diarrhea, nausea and vomiting.       Increased burping in am as outlined.   Genitourinary:  Negative for difficulty urinating and dysuria.  Musculoskeletal:  Negative for joint swelling and myalgias.  Neurological:  Negative for dizziness, light-headedness and headaches.  Psychiatric/Behavioral:  Negative for agitation and dysphoric mood.       Objective:     BP (!) 142/78   Pulse 84   Temp 97.9 F (36.6 C)   Resp 16   Ht _0  (1.93 m)   Wt 234 lb (106.1 kg)   SpO2 99%   BMI 28.48 kg/m  Wt Readings from Last 3 Encounters:  07/17/21 235 lb (106.6 kg)  07/16/21 234 lb (106.1 kg)  06/04/21 230 lb (104.3 kg)    Physical Exam Constitutional:      General: He is not in acute distress.    Appearance: Normal appearance. He is well-developed.  HENT:     Head: Normocephalic and atraumatic.     Right Ear: External ear normal.     Left Ear: External ear normal.  Eyes:     General: No scleral icterus.       Right eye: No discharge.        Left eye: No discharge.  Cardiovascular:     Rate and Rhythm: Normal rate and regular rhythm.  Pulmonary:     Effort: Pulmonary effort is normal. No respiratory distress.     Breath sounds: Normal breath sounds.  Abdominal:     General: Bowel sounds are normal.     Palpations: Abdomen is soft.     Tenderness: There is no abdominal tenderness.  Musculoskeletal:        General: No  swelling or tenderness.     Cervical back: Neck supple. No tenderness.  Lymphadenopathy:     Cervical: No cervical adenopathy.  Skin:    Findings: No erythema or rash.  Neurological:     Mental Status: He is alert.  Psychiatric:        Mood and Affect: Mood normal.        Behavior: Behavior normal.  Outpatient Encounter Medications as of 07/16/2021  Medication Sig   diltiazem (CARDIZEM CD) 180 MG 24 hr capsule Take 1 capsule (180 mg total) by mouth daily.   lisinopril (ZESTRIL) 20 MG tablet TAKE 1 TABLET BY MOUTH DAILY   Multiple Vitamins-Minerals (PRESERVISION AREDS PO) Take 1 tablet by mouth daily.   Omega-3 Fatty Acids (FISH OIL) 1200 MG CAPS Take 1,200 mg by mouth 2 (two) times daily.   pantoprazole (PROTONIX) 40 MG tablet TAKE 1 TABLET BY MOUTH DAILY   Probiotic Product (ALIGN) 4 MG CAPS Take 4 mg by mouth every evening.   rosuvastatin (CRESTOR) 10 MG tablet TAKE 1 TABLET BY MOUTH DAILY   triamcinolone (NASACORT) 55 MCG/ACT AERO nasal inhaler Place 2 sprays into the nose at bedtime.   [DISCONTINUED] aspirin 81 MG tablet Take 81 mg by mouth at bedtime.    [DISCONTINUED] triamterene-hydrochlorothiazide (VZCHYIF-02) 37.5-25 MG tablet TAKE 1/2 TABLET BY MOUTH ONCE A DAY   No facility-administered encounter medications on file as of 07/16/2021.     Lab Results  Component Value Date   WBC 7.5 03/08/2021   HGB 14.8 03/08/2021   HCT 43.4 03/08/2021   PLT 236.0 03/08/2021   GLUCOSE 90 07/12/2021   CHOL 124 07/12/2021   TRIG 55.0 07/12/2021   HDL 54.20 07/12/2021   LDLCALC 59 07/12/2021   ALT 14 07/12/2021   AST 17 07/12/2021   NA 141 07/12/2021   K 4.0 07/12/2021   CL 107 07/12/2021   CREATININE 0.81 07/12/2021   BUN 18 07/12/2021   CO2 28 07/12/2021   TSH 2.01 03/08/2021   PSA 0.01 (L) 07/12/2021   HGBA1C 5.7 07/12/2021       Assessment & Plan:   Problem List Items Addressed This Visit     Burping    Burping in am as outlined.  No acid reflux.  Wants to  monitor.  Will notify me if persistent problem.       GERD (gastroesophageal reflux disease)    No upper symptoms reported.  On protonix.       History of prostate cancer    PSA 07/12/21 - .01.        Hypercholesterolemia    Continue crestor.  Low cholesterol diet and exercise.  Follow lipid panel and liver function tests.       Hyperglycemia    Low carb diet and exercise.  Follow met b and a1c.       Hypertension    Continue diltiazem and lisinopril.  Off triam/hctz.  Blood pressures elevated today.  Blood pressures on outside checks:  120-130/60-70.  Continue current medication.  Follow pressures.  Follow metabolic panel.        SVT (supraventricular tachycardia) (HCC)    Continue diltiazem.  Recently had dose increased.  Doing well.  Stable.  Followed by Dr Caryl Comes.          Einar Pheasant, MD

## 2021-07-17 ENCOUNTER — Ambulatory Visit (INDEPENDENT_AMBULATORY_CARE_PROVIDER_SITE_OTHER): Payer: Medicare PPO | Admitting: Cardiology

## 2021-07-17 ENCOUNTER — Encounter: Payer: Self-pay | Admitting: Cardiology

## 2021-07-17 VITALS — BP 150/80 | HR 65 | Ht 76.0 in | Wt 235.0 lb

## 2021-07-17 DIAGNOSIS — I10 Essential (primary) hypertension: Secondary | ICD-10-CM | POA: Diagnosis not present

## 2021-07-17 DIAGNOSIS — I471 Supraventricular tachycardia: Secondary | ICD-10-CM | POA: Diagnosis not present

## 2021-07-17 NOTE — Patient Instructions (Addendum)
Medication Instructions:  Your physician recommends that you continue on your current medications as directed. Please refer to the Current Medication list given to you today. *If you need a refill on your cardiac medications before your next appointment, please call your pharmacy*  Lab Work: None ordered. If you have labs (blood work) drawn today and your tests are completely normal, you will receive your results only by: [place] (if you have MyChart) OR A paper copy in the mail If you have any lab test that is abnormal or we need to change your treatment, we will call you to review the results.  Testing/Procedures: None ordered.  Follow-Up: At [facility], you and your health needs are our priority.  As part of our continuing mission to provide you with exceptional heart care, we have created designated Provider Care Teams.  These Care Teams include your primary Cardiologist (physician) and Advanced Practice Providers (APPs -  Physician Assistants and Nurse Practitioners) who all work together to provide you with the care you need, when you need it.  Your next appointment:    Let me know if you would like to schedule an EP study and ablation.  Sonia Baller RN Cardiac Ablation Cardiac ablation is a procedure to destroy, or ablate, a small amount of heart tissue in very specific places. The heart has many electrical connections. Sometimes these connections are abnormal and can cause the heart to beat very fast or irregularly. Ablating some of the areas that cause problems can improve the heart's rhythm or return it to normal. Ablation may be done for people who: Have Wolff-Parkinson-White syndrome. Have fast heart rhythms (tachycardia). Have taken medicines for an abnormal heart rhythm (arrhythmia) that were not effective or caused side effects. Have a high-risk heartbeat that may be life-threatening. During the procedure, a small incision is made in the neck or the groin, and a long,  thin tube (catheter) is inserted into the incision and moved to the heart. Small devices (electrodes) on the tip of the catheter will send out electrical currents. A type of X-ray (fluoroscopy) will be used to help guide the catheter and to provide images of the heart. Tell a health care provider about: Any allergies you have. All medicines you are taking, including vitamins, herbs, eye drops, creams, and over-the-counter medicines. Any problems you or family members have had with anesthetic medicines. Any blood disorders you have. Any surgeries you have had. Any medical conditions you have, such as kidney failure. Whether you are pregnant or may be pregnant. What are the risks? Generally, this is a safe procedure. However, problems may occur, including: Infection. Bruising and bleeding at the catheter insertion site. Bleeding into the chest, especially into the sac that surrounds the heart. This is a serious complication. Stroke or blood clots. Damage to nearby structures or organs. Allergic reaction to medicines or dyes. Need for a permanent pacemaker if the normal electrical system is damaged. A pacemaker is a small computer that sends electrical signals to the heart and helps your heart beat normally. The procedure not being fully effective. This may not be recognized until months later. Repeat ablation procedures are sometimes done. What happens before the procedure? Medicines Ask your health care provider about: Changing or stopping your regular medicines. This is especially important if you are taking diabetes medicines or blood thinners. Taking medicines such as aspirin and ibuprofen. These medicines can thin your blood. Do not take these medicines unless your health care provider tells you to take them.  Taking over-the-counter medicines, vitamins, herbs, and supplements. General instructions Follow instructions from your health care provider about eating or drinking  restrictions. Plan to have someone take you home from the hospital or clinic. If you will be going home right after the procedure, plan to have someone with you for 24 hours. Ask your health care provider what steps will be taken to prevent infection. What happens during the procedure?  An IV will be inserted into one of your veins. You will be given a medicine to help you relax (sedative). The skin on your neck or groin will be numbed. An incision will be made in your neck or your groin. A needle will be inserted through the incision and into a large vein in your neck or groin. A catheter will be inserted into the needle and moved to your heart. Dye may be injected through the catheter to help your surgeon see the area of the heart that needs treatment. Electrical currents will be sent from the catheter to ablate heart tissue in desired areas. There are three types of energy that may be used to do this: Heat (radiofrequency energy). Laser energy. Extreme cold (cryoablation). When the tissue has been ablated, the catheter will be removed. Pressure will be held on the insertion area to prevent a lot of bleeding. A bandage (dressing) will be placed over the insertion area. The exact procedure may vary among health care providers and hospitals. What happens after the procedure? Your blood pressure, heart rate, breathing rate, and blood oxygen level will be monitored until you leave the hospital or clinic. Your insertion area will be monitored for bleeding. You will need to lie still for a few hours to ensure that you do not bleed from the insertion area. Do not drive for 24 hours or as long as told by your health care provider. Summary Cardiac ablation is a procedure to destroy, or ablate, a small amount of heart tissue using an electrical current. This procedure can improve the heart rhythm or return it to normal. Tell your health care provider about any medical conditions you may have and  all medicines you are taking to treat them. This is a safe procedure, but problems may occur. Problems may include infection, bruising, damage to nearby organs or structures, or allergic reactions to medicines. Follow your health care provider's instructions about eating and drinking before the procedure. You may also be told to change or stop some of your medicines. After the procedure, do not drive for 24 hours or as long as told by your health care provider. This information is not intended to replace advice given to you by your health care provider. Make sure you discuss any questions you have with your health care provider. Document Revised: 06/27/2019 Document Reviewed: 06/27/2019 Elsevier Patient Education  [place].

## 2021-07-17 NOTE — Progress Notes (Signed)
Electrophysiology Office Note:    Date:  07/17/2021   ID:  Edward James, DOB 08/04/1941, MRN [id number]  PCP:  Einar Pheasant, MD  [facility] Cardiologist:  Kathlyn Sacramento, MD  [facility] Electrophysiologist:  Virl Axe, MD   Referring MD: Deboraha Sprang, MD   Chief Complaint: SVT  History of Present Illness:    Edward James is a 80 y.o. male who presents for an evaluation of SVT at the request of Dr. Caryl Comes.  The patient was last seen by Dr. Caryl Comes June 04, 2021.  At that appointment he described frequent episodes of symptomatic SVT.  His calcium channel blocker was increased and he was scheduled to see me to discuss possible EP study and ablation. Today he confirms the above.  Since increasing his calcium channel blocker he has not had a recurrence of his arrhythmia.    Past Medical History:  Diagnosis Date   Atrial fibrillation [facility])    Diverticulosis    Dysrhythmia    GERD (gastroesophageal reflux disease)    Hypercholesterolemia    Hypertension    Prostate cancer [facility])    s/p radical retropubic prostatectomy 1999   Spinal stenosis     Past Surgical History:  Procedure Laterality Date   BIOPSY  05/19/2019   Procedure: BIOPSY;  Surgeon: Laurence Spates, MD;  [facility];  Service: Endoscopy;;   COLONOSCOPY WITH PROPOFOL N/A 05/19/2019   Procedure: COLONOSCOPY WITH PROPOFOL;  Surgeon: Laurence Spates, MD;  [facility];  Service: Endoscopy;  Laterality: N/A;   ESOPHAGOGASTRODUODENOSCOPY (EGD) WITH PROPOFOL N/A 05/19/2019   Procedure: ESOPHAGOGASTRODUODENOSCOPY (EGD) WITH PROPOFOL;  Surgeon: Laurence Spates, MD;  [facility];  Service: Endoscopy;  Laterality: N/A;   FOLEY CATHETER     INGUINAL HERNIA REPAIR  1945   INGUINAL HERNIA REPAIR Left 12/04/2015   Procedure: HERNIA REPAIR INGUINAL ADULT;  Surgeon: Leonie Green, MD;  Location: [facility] ORS;  Service: General;  Laterality: Left;   LUMBAR FUSION     L3-4 fusion   pilonidal  cyst removal  1969   POLYPECTOMY  05/19/2019   Procedure: POLYPECTOMY;  Surgeon: Laurence Spates, MD;  [facility];  Service: Endoscopy;;   RETROPUBIC PROSTATECTOMY     radical (1999)   TONSILLECTOMY AND ADENOIDECTOMY  1948    Current Medications: Current Meds  Medication Sig   diltiazem (CARDIZEM CD) 180 MG 24 hr capsule Take 1 capsule (180 mg total) by mouth daily.   lisinopril (ZESTRIL) 20 MG tablet TAKE 1 TABLET BY MOUTH DAILY   Multiple Vitamins-Minerals (PRESERVISION AREDS PO) Take 1 tablet by mouth daily.   Omega-3 Fatty Acids (FISH OIL) 1200 MG CAPS Take 1,200 mg by mouth 2 (two) times daily.   pantoprazole (PROTONIX) 40 MG tablet TAKE 1 TABLET BY MOUTH DAILY   Probiotic Product (ALIGN) 4 MG CAPS Take 4 mg by mouth every evening.   rosuvastatin (CRESTOR) 10 MG tablet TAKE 1 TABLET BY MOUTH DAILY   triamcinolone (NASACORT) 55 MCG/ACT AERO nasal inhaler Place 2 sprays into the nose at bedtime.     Allergies:   Patient has no known allergies.   Social History   Socioeconomic History   Marital status: Married    Spouse name: Not on file   Number of children: 3   Years of education: Not on file   Highest education level: Not on file  Occupational History   Not on file  Tobacco Use   Smoking status: Former    Types: Cigarettes  Quit date: 11/28/1973    Years since quitting: 47.6   Smokeless tobacco: Never  Vaping Use   Vaping Use: Never used  Substance and Sexual Activity   Alcohol use: Yes    Alcohol/week: 0.0 standard drinks    Comment: rarely   Drug use: No   Sexual activity: Not on file  Other Topics Concern   Not on file  Social History Narrative   Not on file   Social Determinants of Health   Financial Resource Strain: Low Risk    Difficulty of Paying Living Expenses: Not hard at all  Food Insecurity: No Food Insecurity   Worried About Charity fundraiser in the Last Year: Never true   [place] in the Last Year: Never true   Transportation Needs: No Transportation Needs   Lack of Transportation (Medical): No   Lack of Transportation (Non-Medical): No  Physical Activity: Not on file  Stress: No Stress Concern Present   Feeling of Stress : Not at all  Social Connections: Unknown   Frequency of Communication with Friends and Family: Not on file   Frequency of Social Gatherings with Friends and Family: Not on file   Attends Religious Services: Not on file   Active Member of Clubs or Organizations: Not on file   Attends Archivist Meetings: Not on file   Marital Status: Married     Family History: The patient's family history includes AAA (abdominal aortic aneurysm) in his father; Arthritis in his mother; Dementia in his mother; Nephrolithiasis in an other family member. There is no history of Colon cancer.  ROS:   Please see the history of present illness.    All other systems reviewed and are negative.  EKGs/Labs/Other Studies Reviewed:    The following studies were reviewed today:  June 11, 2018 ZIO monitor 64 episodes of SVT, longest lasting 5 minutes 16 seconds at a rate of 162 bpm Rare PVCs  October 18, 2015 echo Left ventricular function normal, 55% Mild AI Mild MR   EKG:  The ekg ordered today demonstrates sinus rhythm.  No preexcitation.  Left anterior fascicular block.  PAC.   Recent Labs: 03/08/2021: Hemoglobin 14.8; Platelets 236.0; TSH 2.01 07/12/2021: ALT 14; BUN 18; Creatinine, Ser 0.81; Potassium 4.0; Sodium 141  Recent Lipid Panel    Component Value Date/Time   CHOL 124 07/12/2021 0757   TRIG 55.0 07/12/2021 0757   HDL 54.20 07/12/2021 0757   CHOLHDL 2 07/12/2021 0757   VLDL 11.0 07/12/2021 0757   LDLCALC 59 07/12/2021 0757    Physical Exam:    VS:  BP (!) 150/80 (BP Location: Left Arm, Patient Position: Sitting, Cuff Size: Normal)   Pulse 65   Ht 6\' 4"  (1.93 m)   Wt 235 lb (106.6 kg)   SpO2 98%   BMI 28.61 kg/m     Wt Readings from Last 3  Encounters:  07/17/21 235 lb (106.6 kg)  07/16/21 234 lb (106.1 kg)  06/04/21 230 lb (104.3 kg)     GEN:  Well nourished, well developed in no acute distress HEENT: Normal NECK: No JVD; No carotid bruits LYMPHATICS: No lymphadenopathy CARDIAC: RRR, no murmurs, rubs, gallops RESPIRATORY:  Clear to auscultation without rales, wheezing or rhonchi  ABDOMEN: Soft, non-tender, non-distended MUSCULOSKELETAL:  No edema; No deformity  SKIN: Warm and dry NEUROLOGIC:  Alert and oriented x 3 PSYCHIATRIC:  Normal affect       ASSESSMENT:    1. SVT (supraventricular tachycardia) (  [place])   2. Essential hypertension    PLAN:    In order of problems listed above:  #SVT Recurrent episodes.  Review of prior ZIO monitor suggests AVNRT although I cannot completely exclude other mechanisms of SVT.  We discussed the treatment options including continuing diltiazem versus pursuing EP study and ablation.  I do think he is a candidate for EP study if he chooses to go that route.  He will think about it and discuss with his wife and let us know how he would like to proceed.  If he decides to proceed with ablation he will need to hold his diltiazem for 5 days prior to the procedure.  #Hypertension Above goal today.  He is monitoring his blood pressures at home over the next 4 to 5 weeks and has an appointment to discuss the results with his primary care physician.  His hydrochlorothiazide was recently stopped Dr. Caryl Comes and since stopping it his blood pressures have been above goal.  Follow-up with me as needed.    Medication Adjustments/Labs and Tests Ordered: Current medicines are reviewed at length with the patient today.  Concerns regarding medicines are outlined above.  Orders Placed This Encounter  Procedures   EKG 12-Lead   No orders of the defined types were placed in this encounter.    Signed, Hilton Cork. Quentin Ore, MD, [facility], [facility] 07/17/2021 2:48 PM    Electrophysiology [place]  [facility]

## 2021-07-20 ENCOUNTER — Encounter: Payer: Self-pay | Admitting: Internal Medicine

## 2021-07-20 DIAGNOSIS — R142 Eructation: Secondary | ICD-10-CM | POA: Insufficient documentation

## 2021-07-20 NOTE — Assessment & Plan Note (Signed)
PSA 07/12/21 - .01.

## 2021-07-20 NOTE — Assessment & Plan Note (Signed)
Low carb diet and exercise.  Follow met b and a1c.  

## 2021-07-20 NOTE — Assessment & Plan Note (Signed)
Continue diltiazem.  Recently had dose increased.  Doing well.  Stable.  Followed by Dr Caryl Comes.

## 2021-07-20 NOTE — Assessment & Plan Note (Signed)
Burping in am as outlined.  No acid reflux.  Wants to monitor.  Will notify me if persistent problem.

## 2021-07-20 NOTE — Assessment & Plan Note (Signed)
Continue diltiazem and lisinopril.  Off triam/hctz.  Blood pressures elevated today.  Blood pressures on outside checks:  120-130/60-70.  Continue current medication.  Follow pressures.  Follow metabolic panel.

## 2021-07-20 NOTE — Assessment & Plan Note (Signed)
No upper symptoms reported.  On protonix.   

## 2021-07-20 NOTE — Assessment & Plan Note (Signed)
Continue crestor.  Low cholesterol diet and exercise. Follow lipid panel and liver function tests.   

## 2021-08-09 DIAGNOSIS — H353132 Nonexudative age-related macular degeneration, bilateral, intermediate dry stage: Secondary | ICD-10-CM | POA: Diagnosis not present

## 2021-08-27 ENCOUNTER — Ambulatory Visit: Payer: Medicare PPO | Admitting: Internal Medicine

## 2021-09-06 ENCOUNTER — Other Ambulatory Visit: Payer: Self-pay

## 2021-09-06 ENCOUNTER — Encounter: Payer: Self-pay | Admitting: Internal Medicine

## 2021-09-06 ENCOUNTER — Ambulatory Visit: Payer: Medicare PPO | Admitting: Internal Medicine

## 2021-09-06 VITALS — BP 138/78 | HR 71 | Temp 97.9°F | Resp 16 | Ht 76.0 in | Wt 235.0 lb

## 2021-09-06 DIAGNOSIS — K219 Gastro-esophageal reflux disease without esophagitis: Secondary | ICD-10-CM | POA: Diagnosis not present

## 2021-09-06 DIAGNOSIS — I471 Supraventricular tachycardia: Secondary | ICD-10-CM

## 2021-09-06 DIAGNOSIS — E7800 Pure hypercholesterolemia, unspecified: Secondary | ICD-10-CM

## 2021-09-06 DIAGNOSIS — Z8546 Personal history of malignant neoplasm of prostate: Secondary | ICD-10-CM | POA: Diagnosis not present

## 2021-09-06 DIAGNOSIS — R739 Hyperglycemia, unspecified: Secondary | ICD-10-CM

## 2021-09-06 DIAGNOSIS — I10 Essential (primary) hypertension: Secondary | ICD-10-CM

## 2021-09-06 NOTE — Progress Notes (Signed)
Patient ID: Edward James, male   DOB: [birth year], 81 y.o.   MRN: [id number]   Subjective:    Patient ID: Edward James, male    DOB: 01/02/1941, 81 y.o.   MRN: [id number]  This visit occurred during the SARS-CoV-2 public health emergency.  Safety protocols were in place, including screening questions prior to the visit, additional usage of staff PPE, and extensive cleaning of exam room while observing appropriate contact time as indicated for disinfecting solutions.   Patient here for a scheduled follow up.   Chief Complaint  Patient presents with   Hypertension   .   HPI Here to follow up regarding his blood pressure.  Was elevated last visit.  He was also recently evaluated by Dr Quentin Ore.  Discussed possibility of EP study and ablation.  He reports he has done well.  No recent episodes of increased heart rate or palpitations.  Elects to monitor. No chest pain or sob.  No acid reflux.  No abdominal pain.  Bowels doing well.  Overall he feels he is doing well.  Reviewed outside blood pressure readings - averaging 120-130/70s.     Past Medical History:  Diagnosis Date   Atrial fibrillation [facility])    Diverticulosis    Dysrhythmia    GERD (gastroesophageal reflux disease)    Hypercholesterolemia    Hypertension    Prostate cancer [facility])    s/p radical retropubic prostatectomy 1999   Spinal stenosis    Past Surgical History:  Procedure Laterality Date   BIOPSY  05/19/2019   Procedure: BIOPSY;  Surgeon: Laurence Spates, MD;  Location: [facility];  Service: Endoscopy;;   COLONOSCOPY WITH PROPOFOL N/A 05/19/2019   Procedure: COLONOSCOPY WITH PROPOFOL;  Surgeon: Laurence Spates, MD;  Location: [facility];  Service: Endoscopy;  Laterality: N/A;   ESOPHAGOGASTRODUODENOSCOPY (EGD) WITH PROPOFOL N/A 05/19/2019   Procedure: ESOPHAGOGASTRODUODENOSCOPY (EGD) WITH PROPOFOL;  Surgeon: Laurence Spates, MD;  Location: [facility];  Service: Endoscopy;  Laterality: N/A;   FOLEY CATHETER     INGUINAL  HERNIA REPAIR  1945   INGUINAL HERNIA REPAIR Left 12/04/2015   Procedure: HERNIA REPAIR INGUINAL ADULT;  Surgeon: Leonie Green, MD;  Location: [facility];  Service: General;  Laterality: Left;   LUMBAR FUSION     L3-4 fusion   pilonidal cyst removal  1969   POLYPECTOMY  05/19/2019   Procedure: POLYPECTOMY;  Surgeon: Laurence Spates, MD;  Location: [facility];  Service: Endoscopy;;   RETROPUBIC PROSTATECTOMY     radical (1999)   [place]   Family History  Problem Relation Age of Onset   Arthritis Mother    Dementia Mother    AAA (abdominal aortic aneurysm) Father    Nephrolithiasis Other    Colon cancer Neg Hx    Social History   Socioeconomic History   Marital status: Married    Spouse name: Not on file   Number of children: 3   Years of education: Not on file   Highest education level: Not on file  Occupational History   Not on file  Tobacco Use   Smoking status: Former    Types: Cigarettes    Quit date: 11/28/1973    Years since quitting: 47.8   Smokeless tobacco: Never  Vaping Use   Vaping Use: Never used  Substance and Sexual Activity   Alcohol use: Yes    Alcohol/week: 0.0 standard drinks    Comment: rarely   Drug use: No   Sexual activity: Not on  file  Other Topics Concern   Not on file  Social History Narrative   Not on file   Social Determinants of Health   Financial Resource Strain: Low Risk    Difficulty of Paying Living Expenses: Not hard at all  Food Insecurity: No Food Insecurity   Worried About Charity fundraiser in the Last Year: Never true   [place] in the Last Year: Never true  Transportation Needs: No Transportation Needs   Lack of Transportation (Medical): No   Lack of Transportation (Non-Medical): No  Physical Activity: Not on file  Stress: No Stress Concern Present   Feeling of Stress : Not at all  Social Connections: Unknown   Frequency of Communication with Friends and Family: Not on file    Frequency of Social Gatherings with Friends and Family: Not on file   Attends Religious Services: Not on file   Active Member of Clubs or Organizations: Not on file   Attends Archivist Meetings: Not on file   Marital Status: Married     Review of Systems  Constitutional:  Negative for appetite change and unexpected weight change.  HENT:  Negative for congestion and sinus pressure.   Respiratory:  Negative for cough, chest tightness and shortness of breath.   Cardiovascular:  Negative for chest pain, palpitations and leg swelling.  Gastrointestinal:  Negative for abdominal pain, diarrhea, nausea and vomiting.  Genitourinary:  Negative for difficulty urinating and dysuria.  Musculoskeletal:  Negative for joint swelling and myalgias.  Skin:  Negative for color change and rash.  Neurological:  Negative for dizziness, light-headedness and headaches.  Psychiatric/Behavioral:  Negative for agitation and dysphoric mood.       Objective:     BP 138/78    Pulse 71    Temp 97.9 F (36.6 C)    Resp 16    Ht _0  (1.93 m)    Wt 235 lb (106.6 kg)    SpO2 99%    BMI 28.61 kg/m  Wt Readings from Last 3 Encounters:  09/06/21 235 lb (106.6 kg)  07/17/21 235 lb (106.6 kg)  07/16/21 234 lb (106.1 kg)    Physical Exam Constitutional:      General: He is not in acute distress.    Appearance: Normal appearance. He is well-developed.  HENT:     Head: Normocephalic and atraumatic.     Right Ear: External ear normal.     Left Ear: External ear normal.  Eyes:     General: No scleral icterus.       Right eye: No discharge.        Left eye: No discharge.  Cardiovascular:     Rate and Rhythm: Normal rate and regular rhythm.  Pulmonary:     Effort: Pulmonary effort is normal. No respiratory distress.     Breath sounds: Normal breath sounds.  Abdominal:     General: Bowel sounds are normal.     Palpations: Abdomen is soft.     Tenderness: There is no abdominal tenderness.   Musculoskeletal:        General: No swelling or tenderness.     Cervical back: Neck supple. No tenderness.  Lymphadenopathy:     Cervical: No cervical adenopathy.  Skin:    Findings: No erythema or rash.  Neurological:     Mental Status: He is alert.  Psychiatric:        Mood and Affect: Mood normal.  Behavior: Behavior normal.     Outpatient Encounter Medications as of 09/06/2021  Medication Sig   lisinopril (ZESTRIL) 20 MG tablet TAKE 1 TABLET BY MOUTH DAILY   Multiple Vitamins-Minerals (PRESERVISION AREDS PO) Take 1 tablet by mouth daily.   Omega-3 Fatty Acids (FISH OIL) 1200 MG CAPS Take 1,200 mg by mouth 2 (two) times daily.   pantoprazole (PROTONIX) 40 MG tablet TAKE 1 TABLET BY MOUTH DAILY   Probiotic Product (ALIGN) 4 MG CAPS Take 4 mg by mouth every evening.   rosuvastatin (CRESTOR) 10 MG tablet TAKE 1 TABLET BY MOUTH DAILY   triamcinolone (NASACORT) 55 MCG/ACT AERO nasal inhaler Place 2 sprays into the nose at bedtime.   diltiazem (CARDIZEM CD) 180 MG 24 hr capsule Take 1 capsule (180 mg total) by mouth daily.   No facility-administered encounter medications on file as of 09/06/2021.     Lab Results  Component Value Date   WBC 7.5 03/08/2021   HGB 14.8 03/08/2021   HCT 43.4 03/08/2021   PLT 236.0 03/08/2021   GLUCOSE 90 07/12/2021   CHOL 124 07/12/2021   TRIG 55.0 07/12/2021   HDL 54.20 07/12/2021   LDLCALC 59 07/12/2021   ALT 14 07/12/2021   AST 17 07/12/2021   NA 141 07/12/2021   K 4.0 07/12/2021   CL 107 07/12/2021   CREATININE 0.81 07/12/2021   BUN 18 07/12/2021   CO2 28 07/12/2021   TSH 2.01 03/08/2021   PSA 0.01 (L) 07/12/2021   HGBA1C 5.7 07/12/2021       Assessment & Plan:   Problem List Items Addressed This Visit     GERD (gastroesophageal reflux disease)    No upper symptoms reported.  On protonix.       History of prostate cancer    PSA 07/12/21 - .01.        Relevant Orders   PSA, Medicare   Hypercholesterolemia     Continue crestor.  Low cholesterol diet and exercise.  Follow lipid panel and liver function tests.       Relevant Orders   Lipid panel   Hepatic function panel   Hyperglycemia    Low carb diet and exercise.  Follow met b and a1c.       Relevant Orders   Hemoglobin A1c   Hypertension - Primary    Continue diltiazem and lisinopril.  Off triam/hctz.  Blood pressures on outside checks:  120-130/70s.  Continue current medication.  Follow pressures.  Follow metabolic panel.        Relevant Orders   Basic metabolic panel   SVT (supraventricular tachycardia) (HCC)    Continue diltiazem.  Recently had dose increased.  Stable.  Followed by Dr Caryl Comes.  Recently saw Dr Quentin Ore.  Discussed EP study and ablation.  Elected to follow.          Einar Pheasant, MD

## 2021-09-07 ENCOUNTER — Encounter: Payer: Self-pay | Admitting: Internal Medicine

## 2021-09-07 NOTE — Assessment & Plan Note (Signed)
PSA 07/12/21 - .01.

## 2021-09-07 NOTE — Assessment & Plan Note (Signed)
No upper symptoms reported.  On protonix.   

## 2021-09-07 NOTE — Assessment & Plan Note (Signed)
Continue diltiazem and lisinopril.  Off triam/hctz.  Blood pressures on outside checks:  120-130/70s.  Continue current medication.  Follow pressures.  Follow metabolic panel.

## 2021-09-07 NOTE — Assessment & Plan Note (Signed)
Continue diltiazem.  Recently had dose increased.  Stable.  Followed by Dr Caryl Comes.  Recently saw Dr Quentin Ore.  Discussed EP study and ablation.  Elected to follow.

## 2021-09-07 NOTE — Assessment & Plan Note (Signed)
Low carb diet and exercise.  Follow met b and a1c.

## 2021-09-07 NOTE — Assessment & Plan Note (Signed)
Continue crestor.  Low cholesterol diet and exercise. Follow lipid panel and liver function tests.   

## 2021-09-09 ENCOUNTER — Other Ambulatory Visit: Payer: Self-pay | Admitting: Internal Medicine

## 2021-11-11 ENCOUNTER — Other Ambulatory Visit: Payer: Self-pay

## 2021-11-11 ENCOUNTER — Other Ambulatory Visit (INDEPENDENT_AMBULATORY_CARE_PROVIDER_SITE_OTHER): Payer: Medicare PPO

## 2021-11-11 DIAGNOSIS — R739 Hyperglycemia, unspecified: Secondary | ICD-10-CM | POA: Diagnosis not present

## 2021-11-11 DIAGNOSIS — E7800 Pure hypercholesterolemia, unspecified: Secondary | ICD-10-CM | POA: Diagnosis not present

## 2021-11-11 DIAGNOSIS — Z8546 Personal history of malignant neoplasm of prostate: Secondary | ICD-10-CM | POA: Diagnosis not present

## 2021-11-11 DIAGNOSIS — I10 Essential (primary) hypertension: Secondary | ICD-10-CM | POA: Diagnosis not present

## 2021-11-11 LAB — HEPATIC FUNCTION PANEL
ALT: 17 U/L (ref 0–53)
AST: 16 U/L (ref 0–37)
Albumin: 4.3 g/dL (ref 3.5–5.2)
Alkaline Phosphatase: 62 U/L (ref 39–117)
Bilirubin, Direct: 0.1 mg/dL (ref 0.0–0.3)
Total Bilirubin: 0.7 mg/dL (ref 0.2–1.2)
Total Protein: 6.4 g/dL (ref 6.0–8.3)

## 2021-11-11 LAB — LIPID PANEL
Cholesterol: 131 mg/dL (ref 0–200)
HDL: 53.8 mg/dL (ref >39.00)
LDL Cholesterol: 64 mg/dL (ref 0–99)
NonHDL: 77.04
Total CHOL/HDL Ratio: 2
Triglycerides: 66 mg/dL (ref 0.0–149.0)
VLDL: 13.2 mg/dL (ref 0.0–40.0)

## 2021-11-11 LAB — BASIC METABOLIC PANEL
BUN: 20 mg/dL (ref 6–23)
CO2: 28 mEq/L (ref 19–32)
Calcium: 9.2 mg/dL (ref 8.4–10.5)
Chloride: 106 mEq/L (ref 96–112)
Creatinine, Ser: 0.86 mg/dL (ref 0.40–1.50)
GFR: 82.68 mL/min (ref >60.00)
Glucose, Bld: 93 mg/dL (ref 70–99)
Potassium: 4.2 mEq/L (ref 3.5–5.1)
Sodium: 141 mEq/L (ref 135–145)

## 2021-11-11 LAB — HEMOGLOBIN A1C: Hgb A1c MFr Bld: 5.7 % (ref 4.6–6.5)

## 2021-11-11 LAB — PSA, MEDICARE: PSA: 0 ng/ml — ABNORMAL LOW (ref 0.10–4.00)

## 2021-11-13 ENCOUNTER — Ambulatory Visit: Payer: Medicare PPO | Admitting: Internal Medicine

## 2021-11-13 ENCOUNTER — Other Ambulatory Visit: Payer: Self-pay

## 2021-11-13 VITALS — BP 138/80 | HR 89 | Temp 97.9°F | Resp 16 | Ht 76.0 in | Wt 240.0 lb

## 2021-11-13 DIAGNOSIS — Z8546 Personal history of malignant neoplasm of prostate: Secondary | ICD-10-CM | POA: Diagnosis not present

## 2021-11-13 DIAGNOSIS — I471 Supraventricular tachycardia: Secondary | ICD-10-CM

## 2021-11-13 DIAGNOSIS — R739 Hyperglycemia, unspecified: Secondary | ICD-10-CM

## 2021-11-13 DIAGNOSIS — K219 Gastro-esophageal reflux disease without esophagitis: Secondary | ICD-10-CM

## 2021-11-13 DIAGNOSIS — I10 Essential (primary) hypertension: Secondary | ICD-10-CM

## 2021-11-13 DIAGNOSIS — E7800 Pure hypercholesterolemia, unspecified: Secondary | ICD-10-CM | POA: Diagnosis not present

## 2021-11-13 NOTE — Progress Notes (Signed)
Patient ID: Edward James, male   DOB: 1941/01/09, 81 y.o   MRN: [id number] ? ? ?Subjective:  ? ? Patient ID: Edward James, male    DOB: March 11, 1941, 81 y.o.   MRN: [id number] ? ?This visit occurred during the SARS-CoV-2 public health emergency.  Safety protocols were in place, including screening questions prior to the visit, additional usage of staff PPE, and extensive cleaning of exam room while observing appropriate contact time as indicated for disinfecting solutions.  ? ?Patient here for a scheduled follow up.  ? ?Chief Complaint  ?Patient presents with  ? Hyperlipidemia  ? Hypertension  ? SVT  ? .  ? ?HPI ?Follow up: had an episode of SVT this am.  Felt like typical episode.  Lasted only a few minutes.  He sits, relaxes and it goes away.  No increased heart rate or palpitations now.  States he has only had one other episode since his last visit.  Saw Dr Quentin Ore.  Elected to monitor and hold on ablation.  Continues to want to monitor.  No chest pain or sob reported.  No abdominal pain.  No bowel change.  Eating.  No nausea or vomiting.   ? ? ?Past Medical History:  ?Diagnosis Date  ? Atrial fibrillation ([place])   ? Diverticulosis   ? Dysrhythmia   ? GERD (gastroesophageal reflux disease)   ? Hypercholesterolemia   ? Hypertension   ? Prostate cancer ([place])   ? s/p radical retropubic prostatectomy 1999  ? Spinal stenosis   ? ?Past Surgical History:  ?Procedure Laterality Date  ? BIOPSY  05/19/2019  ? Procedure: BIOPSY;  Surgeon: Laurence Spates, MD;  [facility];  Service: Endoscopy;;  ? COLONOSCOPY WITH PROPOFOL N/A 05/19/2019  ? Procedure: COLONOSCOPY WITH PROPOFOL;  Surgeon: Laurence Spates, MD;  [facility];  Service: Endoscopy;  Laterality: N/A;  ? ESOPHAGOGASTRODUODENOSCOPY (EGD) WITH PROPOFOL N/A 05/19/2019  ? Procedure: ESOPHAGOGASTRODUODENOSCOPY (EGD) WITH PROPOFOL;  Surgeon: Laurence Spates, MD;  Location[facility];  Service: Endoscopy;  Laterality: N/A;  ? FOLEY CATHETER    ? [place]  ? INGUINAL HERNIA REPAIR Left 12/04/2015  ? Procedure: HERNIA REPAIR INGUINAL ADULT;  Surgeon: Leonie Green, MD;  Location: [facility];  Service: General;  Laterality: Left;  ? LUMBAR FUSION    ? L3-4 fusion  ? pilonidal cyst removal  1969  ? POLYPECTOMY  05/19/2019  ? Procedure: POLYPECTOMY;  Surgeon: Laurence Spates, MD;  [facility]: [facility];  Service: Endoscopy;;  ? RETROPUBIC PROSTATECTOMY    ? radical (1999)  ? [place]  ? ?Family History  ?Problem Relation Age of Onset  ? Arthritis Mother   ? Dementia Mother   ? AAA (abdominal aortic aneurysm) Father   ? Nephrolithiasis Other   ? Colon cancer Neg Hx   ? ?Social History  ? ?Socioeconomic History  ? Marital status: Married  ?  Spouse name: Not on file  ? Number of children: 3  ? Years of education: Not on file  ? Highest education level: Not on file  ?Occupational History  ? Not on file  ?Tobacco Use  ? Smoking status: Former  ?  Types: Cigarettes  ?  Quit date: 11/28/1973  ?  Years since quitting: 48.0  ? Smokeless tobacco: Never  ?Vaping Use  ? Vaping Use: Never used  ?Substance and Sexual Activity  ? Alcohol use: Yes  ?  Alcohol/week: 0.0 standard drinks  ?  Comment: rarely  ?  Drug use: No  ? Sexual activity: Not on file  ?Other Topics Concern  ? Not on file  ?Social History Narrative  ? Not on file  ? ?Social Determinants of Health  ? ?Financial Resource Strain: Low Risk   ? Difficulty of Paying Living Expenses: Not hard at all  ?Food Insecurity: No Food Insecurity  ? Worried About Charity fundraiser in the Last Year: Never true  ? Ran Out of Food in the Last Year: Never true  ?Transportation Needs: No Transportation Needs  ? Lack of Transportation (Medical): No  ? Lack of Transportation (Non-Medical): No  ?Physical Activity: Not on file  ?Stress: No Stress Concern Present  ? Feeling of Stress : Not at all  ?Social Connections: Unknown  ? Frequency of Communication with Friends and Family: Not on file  ?  Frequency of Social Gatherings with Friends and Family: Not on file  ? Attends Religious Services: Not on file  ? Active Member of Clubs or Organizations: Not on file  ? Attends Archivist Meetings: Not on file  ? Marital Status: Married  ? ? ? ?Review of Systems  ?Constitutional:  Negative for appetite change and unexpected weight change.  ?HENT:  Negative for congestion and sinus pressure.   ?Respiratory:  Negative for cough, chest tightness and shortness of breath.   ?Cardiovascular:  Negative for chest pain, palpitations and leg swelling.  ?Gastrointestinal:  Negative for abdominal pain, diarrhea, nausea and vomiting.  ?Genitourinary:  Negative for difficulty urinating and dysuria.  ?Musculoskeletal:  Negative for joint swelling and myalgias.  ?Skin:  Negative for color change and rash.  ?Neurological:  Negative for dizziness, light-headedness and headaches.  ?Psychiatric/Behavioral:  Negative for agitation and dysphoric mood.   ? ?   ?Objective:  ?  ? ?BP 138/80   Pulse 89   Temp 97.9 ?F (36.6 ?C)   Resp 16   Ht '6\' 4"'$  (1.93 m)   Wt 240 lb (108.9 kg)   SpO2 98%   BMI 29.21 kg/m?  ?Wt Readings from Last 3 Encounters:  ?11/13/21 240 lb (108.9 kg)  ?09/06/21 235 lb (106.6 kg)  ?07/17/21 235 lb (106.6 kg)  ? ? ?Physical Exam ?Constitutional:   ?   General: He is not in acute distress. ?   Appearance: Normal appearance. He is well-developed.  ?HENT:  ?   Head: Normocephalic and atraumatic.  ?   Right Ear: External ear normal.  ?   Left Ear: External ear normal.  ?Eyes:  ?   General: No scleral icterus.    ?   Right eye: No discharge.     ?   Left eye: No discharge.  ?Cardiovascular:  ?   Rate and Rhythm: Normal rate and regular rhythm.  ?Pulmonary:  ?   Effort: Pulmonary effort is normal. No respiratory distress.  ?   Breath sounds: Normal breath sounds.  ?Abdominal:  ?   General: Bowel sounds are normal.  ?   Palpations: Abdomen is soft.  ?   Tenderness: There is no abdominal tenderness.   ?Musculoskeletal:     ?   General: No swelling or tenderness.  ?   Cervical back: Neck supple. No tenderness.  ?Lymphadenopathy:  ?   Cervical: No cervical adenopathy.  ?Skin: ?   Findings: No erythema or rash.  ?Neurological:  ?   Mental Status: He is alert.  ?Psychiatric:     ?   Mood and Affect: Mood normal.     ?  Behavior: Behavior normal.  ? ? ? ?Outpatient Encounter Medications as of 11/13/2021  ?Medication Sig  ? lisinopril (ZESTRIL) 20 MG tablet TAKE 1 TABLET BY MOUTH DAILY  ? Multiple Vitamins-Minerals (PRESERVISION AREDS PO) Take 1 tablet by mouth daily.  ? Omega-3 Fatty Acids (FISH OIL) 1200 MG CAPS Take 1,200 mg by mouth 2 (two) times daily.  ? pantoprazole (PROTONIX) 40 MG tablet TAKE 1 TABLET BY MOUTH DAILY  ? Probiotic Product (ALIGN) 4 MG CAPS Take 4 mg by mouth every evening.  ? rosuvastatin (CRESTOR) 10 MG tablet TAKE 1 TABLET BY MOUTH DAILY  ? triamcinolone (NASACORT) 55 MCG/ACT AERO nasal inhaler Place 2 sprays into the nose at bedtime.  ? diltiazem (CARDIZEM CD) 180 MG 24 hr capsule Take 1 capsule (180 mg total) by mouth daily.  ? ?No facility-administered encounter medications on file as of 11/13/2021.  ?  ? ?Lab Results  ?Component Value Date  ? WBC 7.5 03/08/2021  ? HGB 14.8 03/08/2021  ? HCT 43.4 03/08/2021  ? PLT 236.0 03/08/2021  ? GLUCOSE 93 11/11/2021  ? CHOL 131 11/11/2021  ? TRIG 66.0 11/11/2021  ? HDL 53.80 11/11/2021  ? [place] 64 11/11/2021  ? ALT 17 11/11/2021  ? AST 16 11/11/2021  ? NA 141 11/11/2021  ? K 4.2 11/11/2021  ? CL 106 11/11/2021  ? CREATININE 0.86 11/11/2021  ? BUN 20 11/11/2021  ? CO2 28 11/11/2021  ? TSH 2.01 03/08/2021  ? PSA 0.00 (L) 11/11/2021  ? HGBA1C 5.7 11/11/2021  ? ? ?   ?Assessment & Plan:  ? ?Problem List Items Addressed This Visit   ? ? GERD (gastroesophageal reflux disease)  ?  No upper symptoms reported.  On protonix.  ?  ?  ? History of prostate cancer  ?  PSA 0.00 11/11/21.  ?  ?  ? Hypercholesterolemia  ?  Continue crestor.  Low cholesterol diet and  exercise.  Follow lipid panel and liver function tests.  ?  ?  ? Relevant Orders  ? CBC with Differential/Platelet  ? Hepatic function panel  ? TSH  ? Lipid panel  ? Hyperglycemia  ?  Low carb diet and exercise.  Follow

## 2021-11-17 ENCOUNTER — Encounter: Payer: Self-pay | Admitting: Internal Medicine

## 2021-11-17 NOTE — Assessment & Plan Note (Signed)
Low carb diet and exercise.  Follow met b and a1c.  ?

## 2021-11-17 NOTE — Assessment & Plan Note (Signed)
Had the brief episode this am.  EKG SR - ventricular rate 78.  No acute ischemic changes.  States he has only had one other episode since his last visit.  Saw Dr Quentin Ore.  Elected to monitor and hold on ablation.  Continues to want to monitor.  ?

## 2021-11-17 NOTE — Assessment & Plan Note (Signed)
Continue crestor.  Low cholesterol diet and exercise. Follow lipid panel and liver function tests.   

## 2021-11-17 NOTE — Assessment & Plan Note (Signed)
Continue diltiazem and lisinopril.  Off triam/hctz.  Blood pressures on outside checks:  120-130/70s.  Continue current medication.  Follow pressures.  Follow metabolic panel.   ?

## 2021-11-17 NOTE — Assessment & Plan Note (Signed)
PSA 0.00 11/11/21.  

## 2021-11-17 NOTE — Assessment & Plan Note (Signed)
No upper symptoms reported.  On protonix.   

## 2021-11-25 ENCOUNTER — Ambulatory Visit: Payer: Medicare PPO

## 2021-11-26 ENCOUNTER — Other Ambulatory Visit: Payer: Self-pay | Admitting: Internal Medicine

## 2021-12-11 DIAGNOSIS — L720 Epidermal cyst: Secondary | ICD-10-CM | POA: Diagnosis not present

## 2021-12-23 ENCOUNTER — Other Ambulatory Visit: Payer: Self-pay | Admitting: Internal Medicine

## 2022-01-07 DIAGNOSIS — D2262 Melanocytic nevi of left upper limb, including shoulder: Secondary | ICD-10-CM | POA: Diagnosis not present

## 2022-01-07 DIAGNOSIS — L570 Actinic keratosis: Secondary | ICD-10-CM | POA: Diagnosis not present

## 2022-01-07 DIAGNOSIS — D225 Melanocytic nevi of trunk: Secondary | ICD-10-CM | POA: Diagnosis not present

## 2022-01-07 DIAGNOSIS — D2272 Melanocytic nevi of left lower limb, including hip: Secondary | ICD-10-CM | POA: Diagnosis not present

## 2022-01-07 DIAGNOSIS — L821 Other seborrheic keratosis: Secondary | ICD-10-CM | POA: Diagnosis not present

## 2022-01-07 DIAGNOSIS — Z85828 Personal history of other malignant neoplasm of skin: Secondary | ICD-10-CM | POA: Diagnosis not present

## 2022-02-12 ENCOUNTER — Other Ambulatory Visit: Payer: Self-pay | Admitting: Internal Medicine

## 2022-02-18 DIAGNOSIS — H353132 Nonexudative age-related macular degeneration, bilateral, intermediate dry stage: Secondary | ICD-10-CM | POA: Diagnosis not present

## 2022-02-24 ENCOUNTER — Other Ambulatory Visit: Payer: Self-pay | Admitting: Internal Medicine

## 2022-03-05 ENCOUNTER — Other Ambulatory Visit: Payer: Self-pay | Admitting: Internal Medicine

## 2022-03-13 ENCOUNTER — Other Ambulatory Visit (INDEPENDENT_AMBULATORY_CARE_PROVIDER_SITE_OTHER): Payer: Medicare PPO

## 2022-03-13 DIAGNOSIS — E7800 Pure hypercholesterolemia, unspecified: Secondary | ICD-10-CM

## 2022-03-13 DIAGNOSIS — R739 Hyperglycemia, unspecified: Secondary | ICD-10-CM

## 2022-03-13 DIAGNOSIS — I10 Essential (primary) hypertension: Secondary | ICD-10-CM

## 2022-03-13 LAB — LIPID PANEL
Cholesterol: 123 mg/dL (ref 0–200)
HDL: 49.7 mg/dL (ref >39.00)
LDL Cholesterol: 60 mg/dL (ref 0–99)
NonHDL: 73.52
Total CHOL/HDL Ratio: 2
Triglycerides: 69 mg/dL (ref 0.0–149.0)
VLDL: 13.8 mg/dL (ref 0.0–40.0)

## 2022-03-13 LAB — CBC WITH DIFFERENTIAL/PLATELET
Basophils Absolute: 0 10*3/uL (ref 0.0–0.1)
Basophils Relative: 0.4 % (ref 0.0–3.0)
Eosinophils Absolute: 0.1 10*3/uL (ref 0.0–0.7)
Eosinophils Relative: 1.5 % (ref 0.0–5.0)
HCT: 43.3 % (ref 39.0–52.0)
Hemoglobin: 14.5 g/dL (ref 13.0–17.0)
Lymphocytes Relative: 36 % (ref 12.0–46.0)
Lymphs Abs: 2.4 10*3/uL (ref 0.7–4.0)
MCHC: 33.5 g/dL (ref 30.0–36.0)
MCV: 88.5 fl (ref 78.0–100.0)
Monocytes Absolute: 0.5 10*3/uL (ref 0.1–1.0)
Monocytes Relative: 7.6 % (ref 3.0–12.0)
Neutro Abs: 3.6 10*3/uL (ref 1.4–7.7)
Neutrophils Relative %: 54.5 % (ref 43.0–77.0)
Platelets: 214 10*3/uL (ref 150.0–400.0)
RBC: 4.89 Mil/uL (ref 4.22–5.81)
RDW: 13.4 % (ref 11.5–15.5)
WBC: 6.6 10*3/uL (ref 4.0–10.5)

## 2022-03-13 LAB — HEPATIC FUNCTION PANEL
ALT: 18 U/L (ref 0–53)
AST: 19 U/L (ref 0–37)
Albumin: 4.3 g/dL (ref 3.5–5.2)
Alkaline Phosphatase: 53 U/L (ref 39–117)
Bilirubin, Direct: 0.2 mg/dL (ref 0.0–0.3)
Total Bilirubin: 0.8 mg/dL (ref 0.2–1.2)
Total Protein: 6.8 g/dL (ref 6.0–8.3)

## 2022-03-13 LAB — BASIC METABOLIC PANEL
BUN: 20 mg/dL (ref 6–23)
CO2: 27 mEq/L (ref 19–32)
Calcium: 9.4 mg/dL (ref 8.4–10.5)
Chloride: 106 mEq/L (ref 96–112)
Creatinine, Ser: 0.86 mg/dL (ref 0.40–1.50)
GFR: 82.49 mL/min (ref >60.00)
Glucose, Bld: 90 mg/dL (ref 70–99)
Potassium: 4.3 mEq/L (ref 3.5–5.1)
Sodium: 139 mEq/L (ref 135–145)

## 2022-03-13 LAB — TSH: TSH: 2.62 u[IU]/mL (ref 0.35–5.50)

## 2022-03-13 LAB — HEMOGLOBIN A1C: Hgb A1c MFr Bld: 5.7 % (ref 4.6–6.5)

## 2022-03-18 ENCOUNTER — Ambulatory Visit (INDEPENDENT_AMBULATORY_CARE_PROVIDER_SITE_OTHER): Payer: Medicare PPO | Admitting: Internal Medicine

## 2022-03-18 ENCOUNTER — Encounter: Payer: Self-pay | Admitting: Internal Medicine

## 2022-03-18 VITALS — BP 124/80 | HR 73 | Temp 97.7°F | Resp 21 | Ht 76.0 in | Wt 235.6 lb

## 2022-03-18 DIAGNOSIS — I10 Essential (primary) hypertension: Secondary | ICD-10-CM

## 2022-03-18 DIAGNOSIS — R739 Hyperglycemia, unspecified: Secondary | ICD-10-CM | POA: Diagnosis not present

## 2022-03-18 DIAGNOSIS — Z8546 Personal history of malignant neoplasm of prostate: Secondary | ICD-10-CM

## 2022-03-18 DIAGNOSIS — I471 Supraventricular tachycardia: Secondary | ICD-10-CM | POA: Diagnosis not present

## 2022-03-18 DIAGNOSIS — K219 Gastro-esophageal reflux disease without esophagitis: Secondary | ICD-10-CM

## 2022-03-18 DIAGNOSIS — E7800 Pure hypercholesterolemia, unspecified: Secondary | ICD-10-CM | POA: Diagnosis not present

## 2022-03-18 DIAGNOSIS — Z0000 Encounter for general adult medical examination without abnormal findings: Secondary | ICD-10-CM

## 2022-03-18 NOTE — Assessment & Plan Note (Signed)
Physical today 03/18/22.  Colonoscopy 05/2019 - inflammatory polyp.  No f/u neded.  PSA 10/2011-  0.0.

## 2022-03-18 NOTE — Progress Notes (Signed)
Patient ID: Edward James, male   DOB: 17-Nov-1940, 81 y.o.   MRN: [id number]   Subjective:    Patient ID: Edward James, male    DOB: Apr 10, 1941, 81 y.o.   MRN: [id number]   Patient here for a scheduled follow up.   Chief Complaint  Patient presents with   Hypertension   .   HPI Since his last visit, he has had two episodes of increased heart rate.  Last, just a few days ago - working outside.  Rate 120 - highest.  Lasted 2-3 minutes.  Resolved. No chest pain.  No sob.  No syncope or near syncope.  Overall feels heart is doing better.  No acid reflux.  No abdominal pain.  GI issues better.  No increased gas.  Reviewed outside blood pressures - 120-130s/60-70 (most readings).  Some variation.  Discussed labs.    Past Medical History:  Diagnosis Date   Atrial fibrillation [facility])    Diverticulosis    Dysrhythmia    GERD (gastroesophageal reflux disease)    Hypercholesterolemia    Hypertension    Prostate cancer [facility])    s/p radical retropubic prostatectomy 1999   Spinal stenosis    Past Surgical History:  Procedure Laterality Date   BIOPSY  05/19/2019   Procedure: BIOPSY;  Surgeon: Laurence Spates, MD;  [facility];  Service: Endoscopy;;   COLONOSCOPY WITH PROPOFOL N/A 05/19/2019   Procedure: COLONOSCOPY WITH PROPOFOL;  Surgeon: Laurence Spates, MD;  [facility];  Service: Endoscopy;  Laterality: N/A;   ESOPHAGOGASTRODUODENOSCOPY (EGD) WITH PROPOFOL N/A 05/19/2019   Procedure: ESOPHAGOGASTRODUODENOSCOPY (EGD) WITH PROPOFOL;  Surgeon: Laurence Spates, MD;  [facility];  Service: Endoscopy;  Laterality: N/A;   FOLEY CATHETER     INGUINAL HERNIA REPAIR  1945   INGUINAL HERNIA REPAIR Left 12/04/2015   Procedure: HERNIA REPAIR INGUINAL ADULT;  Surgeon: Leonie Green, MD;  Location: [facility];  Service: General;  Laterality: Left;   LUMBAR FUSION     L3-4 fusion   pilonidal cyst removal  1969   POLYPECTOMY  05/19/2019   Procedure: POLYPECTOMY;  Surgeon:  Laurence Spates, MD;  [facility];  Service: Endoscopy;;   RETROPUBIC PROSTATECTOMY     radical (1999)   [place]   Family History  Problem Relation Age of Onset   Arthritis Mother    Dementia Mother    AAA (abdominal aortic aneurysm) Father    Nephrolithiasis Other    Colon cancer Neg Hx    Social History   Socioeconomic History   Marital status: Married    Spouse name: Not on file   Number of children: 3   Years of education: Not on file   Highest education level: Not on file  Occupational History   Not on file  Tobacco Use   Smoking status: Former    Types: Cigarettes    Quit date: 11/28/1973    Years since quitting: 48.3   Smokeless tobacco: Never  Vaping Use   Vaping Use: Never used  Substance and Sexual Activity   Alcohol use: Yes    Alcohol/week: 0.0 standard drinks of alcohol    Comment: rarely   Drug use: No   Sexual activity: Not on file  Other Topics Concern   Not on file  Social History Narrative   Not on file   Social Determinants of Health   Financial Resource Strain: Low Risk  (11/22/2020)   Overall Financial Resource Strain (CARDIA)  Difficulty of Paying Living Expenses: Not hard at all  Food Insecurity: No Food Insecurity (11/22/2020)   Hunger Vital Sign    Worried About Running Out of Food in the Last Year: Never true    Ran Out of Food in the Last Year: Never true  Transportation Needs: No Transportation Needs (11/22/2020)   PRAPARE - Hydrologist (Medical): No    Lack of Transportation (Non-Medical): No  Physical Activity: Not on file  Stress: No Stress Concern Present (11/22/2020)   [place]    Feeling of Stress : Not at all  Social Connections: Unknown (11/22/2020)   Social Connection and Isolation Panel [NHANES]    Frequency of Communication with Friends and Family: Not on file    Frequency of Social  Gatherings with Friends and Family: Not on file    Attends Religious Services: Not on file    Active Member of Clubs or Organizations: Not on file    Attends Archivist Meetings: Not on file    Marital Status: Married     Review of Systems  Constitutional:  Negative for appetite change and unexpected weight change.  HENT:  Negative for congestion and sinus pressure.   Respiratory:  Negative for cough, chest tightness and shortness of breath.   Cardiovascular:  Negative for chest pain, palpitations and leg swelling.  Gastrointestinal:  Negative for abdominal pain, diarrhea, nausea and vomiting.  Genitourinary:  Negative for difficulty urinating and dysuria.  Musculoskeletal:  Negative for joint swelling and myalgias.  Skin:  Negative for color change and rash.  Neurological:  Negative for dizziness, light-headedness and headaches.  Psychiatric/Behavioral:  Negative for agitation and dysphoric mood.        Objective:     BP 124/80 (BP Location: Left Arm, Patient Position: Sitting, Cuff Size: Normal)   Pulse 73   Temp 97.7 F (36.5 C) (Oral)   Resp (!) 21   Ht _0  (1.93 m)   Wt 235 lb 9.6 oz (106.9 kg)   SpO2 98%   BMI 28.68 kg/m  Wt Readings from Last 3 Encounters:  03/18/22 235 lb 9.6 oz (106.9 kg)  11/13/21 240 lb (108.9 kg)  09/06/21 235 lb (106.6 kg)    Physical Exam Constitutional:      General: He is not in acute distress.    Appearance: Normal appearance. He is well-developed.  HENT:     Head: Normocephalic and atraumatic.     Right Ear: External ear normal.     Left Ear: External ear normal.  Eyes:     General: No scleral icterus.       Right eye: No discharge.        Left eye: No discharge.  Cardiovascular:     Rate and Rhythm: Normal rate and regular rhythm.  Pulmonary:     Effort: Pulmonary effort is normal. No respiratory distress.     Breath sounds: Normal breath sounds.  Abdominal:     General: Bowel sounds are normal.      Palpations: Abdomen is soft.     Tenderness: There is no abdominal tenderness.  Musculoskeletal:        General: No swelling or tenderness.     Cervical back: Neck supple. No tenderness.  Lymphadenopathy:     Cervical: No cervical adenopathy.  Skin:    Findings: No erythema or rash.  Neurological:     Mental Status: He is alert.  Psychiatric:        Mood and Affect: Mood normal.        Behavior: Behavior normal.      Outpatient Encounter Medications as of 03/18/2022  Medication Sig   diltiazem (CARDIZEM CD) 180 MG 24 hr capsule TAKE 1 CAPSULE BY MOUTH DAILY   lisinopril (ZESTRIL) 20 MG tablet TAKE 1 TABLET BY MOUTH DAILY   Multiple Vitamins-Minerals (PRESERVISION AREDS PO) Take 1 tablet by mouth daily.   Omega-3 Fatty Acids (FISH OIL) 1200 MG CAPS Take 1,200 mg by mouth 2 (two) times daily.   pantoprazole (PROTONIX) 40 MG tablet TAKE 1 TABLET BY MOUTH DAILY   Probiotic Product (ALIGN) 4 MG CAPS Take 4 mg by mouth every evening.   rosuvastatin (CRESTOR) 10 MG tablet TAKE 1 TABLET BY MOUTH DAILY   triamcinolone (NASACORT) 55 MCG/ACT AERO nasal inhaler Place 2 sprays into the nose at bedtime.   No facility-administered encounter medications on file as of 03/18/2022.     Lab Results  Component Value Date   WBC 6.6 03/13/2022   HGB 14.5 03/13/2022   HCT 43.3 03/13/2022   PLT 214.0 03/13/2022   GLUCOSE 90 03/13/2022   CHOL 123 03/13/2022   TRIG 69.0 03/13/2022   HDL 49.70 03/13/2022   LDLCALC 60 03/13/2022   ALT 18 03/13/2022   AST 19 03/13/2022   NA 139 03/13/2022   K 4.3 03/13/2022   CL 106 03/13/2022   CREATININE 0.86 03/13/2022   BUN 20 03/13/2022   CO2 27 03/13/2022   TSH 2.62 03/13/2022   PSA 0.00 (L) 11/11/2021   HGBA1C 5.7 03/13/2022       Assessment & Plan:   Problem List Items Addressed This Visit     GERD (gastroesophageal reflux disease)    No upper symptoms reported.  On protonix.       Health care maintenance    Physical today 03/18/22.   Colonoscopy 05/2019 - inflammatory polyp.  No f/u neded.  PSA 10/2011-  0.0.       History of prostate cancer    PSA 0.00 11/11/21.       Relevant Orders   PSA, Medicare   Hypercholesterolemia    Continue crestor.  Low cholesterol diet and exercise.  Follow lipid panel and liver function tests.       Relevant Orders   Hepatic function panel   Lipid Profile   Hyperglycemia    Low carb diet and exercise.  Follow met b and a1c.       Relevant Orders   HgB A1c   Hypertension - Primary    Continue diltiazem and lisinopril.  Blood pressures on outside checks:  120-130/70s.  Continue current medication.  Follow pressures.  Follow metabolic panel.        Relevant Orders   Basic Metabolic Panel (BMET)   SVT (supraventricular tachycardia) ([place])    Had two episodes since last visit as outlined.  Feels from cardiac standpoint - doing better. Saw Dr Quentin Ore.  Elected to monitor and hold on ablation.  Continues to want to monitor. Continue diltiazem.         Einar Pheasant, MD

## 2022-03-20 ENCOUNTER — Encounter: Payer: Self-pay | Admitting: Internal Medicine

## 2022-03-20 NOTE — Assessment & Plan Note (Signed)
No upper symptoms reported.  On protonix.   

## 2022-03-20 NOTE — Assessment & Plan Note (Signed)
Continue diltiazem and lisinopril.  Blood pressures on outside checks:  120-130/70s.  Continue current medication.  Follow pressures.  Follow metabolic panel.

## 2022-03-20 NOTE — Assessment & Plan Note (Signed)
Low carb diet and exercise.  Follow met b and a1c.  

## 2022-03-20 NOTE — Assessment & Plan Note (Signed)
PSA 0.00 11/11/21.

## 2022-03-20 NOTE — Assessment & Plan Note (Addendum)
Had two episodes since last visit as outlined.  Feels from cardiac standpoint - doing better. Saw Dr Quentin Ore.  Elected to monitor and hold on ablation.  Continues to want to monitor. Continue diltiazem.

## 2022-03-20 NOTE — Assessment & Plan Note (Signed)
Continue crestor.  Low cholesterol diet and exercise. Follow lipid panel and liver function tests.   

## 2022-04-08 NOTE — Telephone Encounter (Signed)
Attempted to schedule.  

## 2022-04-08 NOTE — Telephone Encounter (Signed)
Scheduled

## 2022-05-23 ENCOUNTER — Other Ambulatory Visit: Payer: Self-pay | Admitting: Internal Medicine

## 2022-06-09 ENCOUNTER — Ambulatory Visit (INDEPENDENT_AMBULATORY_CARE_PROVIDER_SITE_OTHER): Payer: Medicare PPO

## 2022-06-09 DIAGNOSIS — Z23 Encounter for immunization: Secondary | ICD-10-CM | POA: Diagnosis not present

## 2022-06-16 ENCOUNTER — Other Ambulatory Visit: Payer: Self-pay | Admitting: Internal Medicine

## 2022-07-03 ENCOUNTER — Other Ambulatory Visit (INDEPENDENT_AMBULATORY_CARE_PROVIDER_SITE_OTHER): Payer: Medicare PPO

## 2022-07-03 DIAGNOSIS — Z8546 Personal history of malignant neoplasm of prostate: Secondary | ICD-10-CM | POA: Diagnosis not present

## 2022-07-03 DIAGNOSIS — Z125 Encounter for screening for malignant neoplasm of prostate: Secondary | ICD-10-CM | POA: Diagnosis not present

## 2022-07-03 DIAGNOSIS — I10 Essential (primary) hypertension: Secondary | ICD-10-CM

## 2022-07-03 DIAGNOSIS — R739 Hyperglycemia, unspecified: Secondary | ICD-10-CM | POA: Diagnosis not present

## 2022-07-03 DIAGNOSIS — E7800 Pure hypercholesterolemia, unspecified: Secondary | ICD-10-CM | POA: Diagnosis not present

## 2022-07-03 LAB — HEPATIC FUNCTION PANEL
ALT: 17 U/L (ref 0–53)
AST: 20 U/L (ref 0–37)
Albumin: 4.2 g/dL (ref 3.5–5.2)
Alkaline Phosphatase: 56 U/L (ref 39–117)
Bilirubin, Direct: 0.2 mg/dL (ref 0.0–0.3)
Total Bilirubin: 0.7 mg/dL (ref 0.2–1.2)
Total Protein: 6.5 g/dL (ref 6.0–8.3)

## 2022-07-03 LAB — LIPID PANEL
Cholesterol: 115 mg/dL (ref 0–200)
HDL: 47.5 mg/dL (ref >39.00)
LDL Cholesterol: 55 mg/dL (ref 0–99)
NonHDL: 67.17
Total CHOL/HDL Ratio: 2
Triglycerides: 60 mg/dL (ref 0.0–149.0)
VLDL: 12 mg/dL (ref 0.0–40.0)

## 2022-07-03 LAB — BASIC METABOLIC PANEL
BUN: 16 mg/dL (ref 6–23)
CO2: 28 mEq/L (ref 19–32)
Calcium: 9.2 mg/dL (ref 8.4–10.5)
Chloride: 106 mEq/L (ref 96–112)
Creatinine, Ser: 0.79 mg/dL (ref 0.40–1.50)
GFR: 84.45 mL/min (ref >60.00)
Glucose, Bld: 90 mg/dL (ref 70–99)
Potassium: 4.5 mEq/L (ref 3.5–5.1)
Sodium: 141 mEq/L (ref 135–145)

## 2022-07-03 LAB — PSA, MEDICARE: PSA: 0.04 ng/ml — ABNORMAL LOW (ref 0.10–4.00)

## 2022-07-03 LAB — HEMOGLOBIN A1C: Hgb A1c MFr Bld: 5.8 % (ref 4.6–6.5)

## 2022-07-09 ENCOUNTER — Ambulatory Visit: Payer: Medicare PPO | Admitting: Internal Medicine

## 2022-07-09 ENCOUNTER — Encounter: Payer: Self-pay | Admitting: Internal Medicine

## 2022-07-09 VITALS — BP 134/70 | HR 59 | Temp 97.9°F | Ht 76.0 in | Wt 240.6 lb

## 2022-07-09 DIAGNOSIS — I10 Essential (primary) hypertension: Secondary | ICD-10-CM

## 2022-07-09 DIAGNOSIS — K219 Gastro-esophageal reflux disease without esophagitis: Secondary | ICD-10-CM

## 2022-07-09 DIAGNOSIS — R739 Hyperglycemia, unspecified: Secondary | ICD-10-CM

## 2022-07-09 DIAGNOSIS — E7800 Pure hypercholesterolemia, unspecified: Secondary | ICD-10-CM

## 2022-07-09 DIAGNOSIS — Z8546 Personal history of malignant neoplasm of prostate: Secondary | ICD-10-CM

## 2022-07-09 DIAGNOSIS — I471 Supraventricular tachycardia, unspecified: Secondary | ICD-10-CM | POA: Diagnosis not present

## 2022-07-09 NOTE — Progress Notes (Signed)
Patient ID: Edward James, male   DOB: 1941/04/25, 81 y.o.   MRN: [id number]   Subjective:    Patient ID: Edward James, male    DOB: 09-17-1940, 81 y.o.   MRN: [id number]   Patient here for  Chief Complaint  Patient presents with   Follow-up   .   HPI Here to follow up regarding hypercholesterolemia, afib and hypertension.  He is doing well.  Stays active.  No chest pain or sob reported.  No increased heart rate or palpitations.  No abdominal pain.  Bowels moving.  States he had the best summer he has had in a long time.  Overall feels good.  No urine change.    Past Medical History:  Diagnosis Date   Atrial fibrillation [facility])    Diverticulosis    Dysrhythmia    GERD (gastroesophageal reflux disease)    Hypercholesterolemia    Hypertension    Prostate cancer [facility])    s/p radical retropubic prostatectomy 1999   Spinal stenosis    Past Surgical History:  Procedure Laterality Date   BIOPSY  05/19/2019   Procedure: BIOPSY;  Surgeon: Laurence Spates, MD;  [facility];  Service: Endoscopy;;   COLONOSCOPY WITH PROPOFOL N/A 05/19/2019   Procedure: COLONOSCOPY WITH PROPOFOL;  Surgeon: Laurence Spates, MD;  [facility];  Service: Endoscopy;  Laterality: N/A;   ESOPHAGOGASTRODUODENOSCOPY (EGD) WITH PROPOFOL N/A 05/19/2019   Procedure: ESOPHAGOGASTRODUODENOSCOPY (EGD) WITH PROPOFOL;  Surgeon: Laurence Spates, MD;  [facility];  Service: Endoscopy;  Laterality: N/A;   FOLEY CATHETER     INGUINAL HERNIA REPAIR  1945   INGUINAL HERNIA REPAIR Left 12/04/2015   Procedure: HERNIA REPAIR INGUINAL ADULT;  Surgeon: Leonie Green, MD;  Location: [facility];  Service: General;  Laterality: Left;   LUMBAR FUSION     L3-4 fusion   pilonidal cyst removal  1969   POLYPECTOMY  05/19/2019   Procedure: POLYPECTOMY;  Surgeon: Laurence Spates, MD;  [facility];  Service: Endoscopy;;   RETROPUBIC PROSTATECTOMY     radical (1999)   [place]   Family History  Problem Relation Age of Onset   Arthritis Mother    Dementia Mother    AAA (abdominal aortic aneurysm) Father    Nephrolithiasis Other    Colon cancer Neg Hx    Social History   Socioeconomic History   Marital status: Married    Spouse name: Not on file   Number of children: 3   Years of education: Not on file   Highest education level: Not on file  Occupational History   Not on file  Tobacco Use   Smoking status: Former    Types: Cigarettes    Quit date: 11/28/1973    Years since quitting: 48.6   Smokeless tobacco: Never  Vaping Use   Vaping Use: Never used  Substance and Sexual Activity   Alcohol use: Yes    Alcohol/week: 0.0 standard drinks of alcohol    Comment: rarely   Drug use: No   Sexual activity: Not on file  Other Topics Concern   Not on file  Social History Narrative   Not on file   Social Determinants of Health   Financial Resource Strain: Low Risk  (11/22/2020)   Overall Financial Resource Strain (CARDIA)    Difficulty of Paying Living Expenses: Not hard at all  Food Insecurity: No Food Insecurity (11/22/2020)   Hunger Vital Sign    Worried About Running Out of  Food in the Last Year: Never true    [place] in the Last Year: Never true  Transportation Needs: No Transportation Needs (11/22/2020)   PRAPARE - Hydrologist (Medical): No    Lack of Transportation (Non-Medical): No  Physical Activity: Not on file  Stress: No Stress Concern Present (11/22/2020)   [place]    Feeling of Stress : Not at all  Social Connections: Unknown (11/22/2020)   Social Connection and Isolation Panel [NHANES]    Frequency of Communication with Friends and Family: Not on file    Frequency of Social Gatherings with Friends and Family: Not on file    Attends Religious Services: Not on file    Active Member of Clubs or Organizations: Not on file     Attends Archivist Meetings: Not on file    Marital Status: Married     Review of Systems  Constitutional:  Negative for appetite change and unexpected weight change.  HENT:  Negative for congestion and sinus pressure.   Respiratory:  Negative for cough, chest tightness and shortness of breath.   Cardiovascular:  Negative for chest pain, palpitations and leg swelling.  Gastrointestinal:  Negative for abdominal pain, diarrhea, nausea and vomiting.  Genitourinary:  Negative for difficulty urinating and dysuria.  Musculoskeletal:  Negative for joint swelling and myalgias.  Skin:  Negative for color change and rash.  Neurological:  Negative for dizziness and headaches.  Psychiatric/Behavioral:  Negative for agitation and dysphoric mood.        Objective:     BP 134/70 (BP Location: Left Arm, Patient Position: Sitting, Cuff Size: Large)   Pulse (!) 59   Temp 97.9 F (36.6 C) (Oral)   Ht _0  (1.93 m)   Wt 240 lb 9.6 oz (109.1 kg)   SpO2 98%   BMI 29.29 kg/m  Wt Readings from Last 3 Encounters:  07/09/22 240 lb 9.6 oz (109.1 kg)  03/18/22 235 lb 9.6 oz (106.9 kg)  11/13/21 240 lb (108.9 kg)    Physical Exam Vitals reviewed.  Constitutional:      General: He is not in acute distress.    Appearance: Normal appearance. He is well-developed.  HENT:     Head: Normocephalic and atraumatic.     Right Ear: External ear normal.     Left Ear: External ear normal.  Eyes:     General: No scleral icterus.       Right eye: No discharge.        Left eye: No discharge.     Conjunctiva/sclera: Conjunctivae normal.  Cardiovascular:     Rate and Rhythm: Normal rate and regular rhythm.  Pulmonary:     Effort: Pulmonary effort is normal. No respiratory distress.     Breath sounds: Normal breath sounds.  Abdominal:     General: Bowel sounds are normal.     Palpations: Abdomen is soft.     Tenderness: There is no abdominal tenderness.  Musculoskeletal:        General: No  swelling or tenderness.     Cervical back: Neck supple. No tenderness.  Lymphadenopathy:     Cervical: No cervical adenopathy.  Skin:    Findings: No erythema or rash.  Neurological:     Mental Status: He is alert.  Psychiatric:        Mood and Affect: Mood normal.        Behavior: Behavior  normal.      Outpatient Encounter Medications as of 07/09/2022  Medication Sig   diltiazem (CARDIZEM CD) 180 MG 24 hr capsule TAKE 1 CAPSULE BY MOUTH DAILY   lisinopril (ZESTRIL) 20 MG tablet TAKE 1 TABLET BY MOUTH DAILY   Multiple Vitamins-Minerals (PRESERVISION AREDS PO) Take 1 tablet by mouth daily.   Omega-3 Fatty Acids (FISH OIL) 1200 MG CAPS Take 1,200 mg by mouth 2 (two) times daily.   pantoprazole (PROTONIX) 40 MG tablet TAKE 1 TABLET BY MOUTH DAILY   Probiotic Product (ALIGN) 4 MG CAPS Take 4 mg by mouth every evening.   rosuvastatin (CRESTOR) 10 MG tablet TAKE 1 TABLET BY MOUTH DAILY   triamcinolone (NASACORT) 55 MCG/ACT AERO nasal inhaler Place 2 sprays into the nose at bedtime.   No facility-administered encounter medications on file as of 07/09/2022.     Lab Results  Component Value Date   WBC 6.6 03/13/2022   HGB 14.5 03/13/2022   HCT 43.3 03/13/2022   PLT 214.0 03/13/2022   GLUCOSE 90 07/03/2022   CHOL 115 07/03/2022   TRIG 60.0 07/03/2022   HDL 47.50 07/03/2022   LDLCALC 55 07/03/2022   ALT 17 07/03/2022   AST 20 07/03/2022   NA 141 07/03/2022   K 4.5 07/03/2022   CL 106 07/03/2022   CREATININE 0.79 07/03/2022   BUN 16 07/03/2022   CO2 28 07/03/2022   TSH 2.62 03/13/2022   PSA 0.04 (L) 07/03/2022   HGBA1C 5.8 07/03/2022       Assessment & Plan:   Problem List Items Addressed This Visit     GERD (gastroesophageal reflux disease)    No upper symptoms reported.  On protonix.       History of prostate cancer    PSA 07/03/22 - .04.  Has seen Dr Bernardo Heater.        Relevant Orders   PSA, Medicare   Hypercholesterolemia - Primary    Continue crestor.  Low  cholesterol diet and exercise.  Follow lipid panel and liver function tests.       Relevant Orders   Hepatic function panel   Lipid panel   Hyperglycemia    Low carb diet and exercise.  Follow met b and a1c.       Relevant Orders   Hemoglobin A1c   Hypertension    Continue diltiazem and lisinopril.  Continue current medication.  Follow pressures.  Follow metabolic panel.        Relevant Orders   Basic metabolic panel   SVT (supraventricular tachycardia)    Sees Dr Quentin Ore.  Elected to monitor and hold on ablation.  Continues to want to monitor. Continue diltiazem.         Einar Pheasant, MD

## 2022-07-14 ENCOUNTER — Encounter: Payer: Self-pay | Admitting: Internal Medicine

## 2022-07-14 NOTE — Assessment & Plan Note (Signed)
Continue crestor.  Low cholesterol diet and exercise. Follow lipid panel and liver function tests.   

## 2022-07-14 NOTE — Assessment & Plan Note (Signed)
PSA 07/03/22 - .04.  Has seen Dr Bernardo Heater.

## 2022-07-14 NOTE — Assessment & Plan Note (Signed)
Continue diltiazem and lisinopril.  Continue current medication.  Follow pressures.  Follow metabolic panel.

## 2022-07-14 NOTE — Assessment & Plan Note (Signed)
Low carb diet and exercise.  Follow met b and a1c.  

## 2022-07-14 NOTE — Assessment & Plan Note (Signed)
Sees Dr Quentin Ore.  Elected to monitor and hold on ablation.  Continues to want to monitor. Continue diltiazem.

## 2022-07-14 NOTE — Assessment & Plan Note (Signed)
No upper symptoms reported.  On protonix.   

## 2022-08-07 ENCOUNTER — Encounter: Payer: Self-pay | Admitting: Internal Medicine

## 2022-08-07 ENCOUNTER — Ambulatory Visit: Payer: Medicare PPO | Attending: Internal Medicine | Admitting: Internal Medicine

## 2022-08-07 VITALS — BP 142/80 | HR 66 | Ht 76.0 in | Wt 243.0 lb

## 2022-08-07 DIAGNOSIS — I493 Ventricular premature depolarization: Secondary | ICD-10-CM | POA: Diagnosis not present

## 2022-08-07 DIAGNOSIS — I10 Essential (primary) hypertension: Secondary | ICD-10-CM | POA: Diagnosis not present

## 2022-08-07 DIAGNOSIS — R001 Bradycardia, unspecified: Secondary | ICD-10-CM

## 2022-08-07 DIAGNOSIS — I471 Supraventricular tachycardia, unspecified: Secondary | ICD-10-CM

## 2022-08-07 NOTE — Progress Notes (Signed)
Patient Care Team: Einar Pheasant, MD as PCP - General (Internal Medicine) Deboraha Sprang, MD as PCP - Electrophysiology (Cardiology) Wellington Hampshire, MD as PCP - Cardiology (Cardiology)   HPI  Edward James is a 81 y.o. male is seen in followup for syncope and previously identified SVT, frequently occurring and thought to AVNRT. Not assoc  Previously seen 12/19 for SVT identified on a monitor.  Frequent episodes were noted.  No associated symptoms and so no therapy was indicated.  He describes an episode in March of becoming quite anxious while he was standing accompanied by lightheadedness.  No associated palpitations.  He has noted this anxiety before and since.  A couple of weeks ago he was out working in the yard in the heat.  A number of hours.  Went to [employer] to watch a baseball game with his son and was sitting in the center field grass.  Became anxious again and somewhat lightheaded.  No palpitations.  Walked to his car with his daughter when he passed out.  Unconscious for a couple of minutes.  Residual fatigue and orthostatic intolerance.  His heart rate monitor which he wears on his watch detected a heart rate of 160.  Notably, this is the rate of his SVT as detected previously.  It was elected to treat him medically with diltiazem  In the interim he has had recurrent SVT, with about 4 episodes over the last couple of weeks but this is a significant flurry.  There are much less symptomatic than previously typically lasting 15-20 minutes with some lightheadedness and shortness of breath. The patient denies chest pain, shortness of breath, nocturnal dyspnea, orthopnea or peripheral edema.  There have been no palpitations, lightheadedness or syncope. .    Past Medical History:  Diagnosis Date   Atrial fibrillation ([place])    Diverticulosis    Dysrhythmia    GERD (gastroesophageal reflux disease)    Hypercholesterolemia    Hypertension    Prostate cancer [facility])    s/p  radical retropubic prostatectomy 1999   Spinal stenosis     Past Surgical History:  Procedure Laterality Date   BIOPSY  05/19/2019   Procedure: BIOPSY;  Surgeon: Laurence Spates, MD;  [facility];  Service: Endoscopy;;   COLONOSCOPY WITH PROPOFOL N/A 05/19/2019   Procedure: COLONOSCOPY WITH PROPOFOL;  Surgeon: Laurence Spates, MD;  [facility];  Service: Endoscopy;  Laterality: N/A;   ESOPHAGOGASTRODUODENOSCOPY (EGD) WITH PROPOFOL N/A 05/19/2019   Procedure: ESOPHAGOGASTRODUODENOSCOPY (EGD) WITH PROPOFOL;  Surgeon: Laurence Spates, MD;  [facility];  Service: Endoscopy;  Laterality: N/A;   FOLEY CATHETER     INGUINAL HERNIA REPAIR  1945   INGUINAL HERNIA REPAIR Left 12/04/2015   Procedure: HERNIA REPAIR INGUINAL ADULT;  Surgeon: Leonie Green, MD;  Location: [facility];  Service: General;  Laterality: Left;   LUMBAR FUSION     L3-4 fusion   pilonidal cyst removal  1969   POLYPECTOMY  05/19/2019   Procedure: POLYPECTOMY;  Surgeon: Laurence Spates, MD;  [facility];  Service: Endoscopy;;   RETROPUBIC PROSTATECTOMY     radical (1999)   TONSILLECTOMY AND ADENOIDECTOMY  1948    Current Meds  Medication Sig   diltiazem (CARDIZEM CD) 180 MG 24 hr capsule TAKE 1 CAPSULE BY MOUTH DAILY   lisinopril (ZESTRIL) 20 MG tablet TAKE 1 TABLET BY MOUTH DAILY   Multiple Vitamins-Minerals (PRESERVISION AREDS PO) Take 1 tablet by mouth daily.   Omega-3 Fatty  Acids (FISH OIL) 1200 MG CAPS Take 1,200 mg by mouth 2 (two) times daily.   pantoprazole (PROTONIX) 40 MG tablet TAKE 1 TABLET BY MOUTH DAILY   Probiotic Product (ALIGN) 4 MG CAPS Take 4 mg by mouth every evening.   rosuvastatin (CRESTOR) 10 MG tablet TAKE 1 TABLET BY MOUTH DAILY   triamcinolone (NASACORT) 55 MCG/ACT AERO nasal inhaler Place 2 sprays into the nose at bedtime.    No Known Allergies    Review of Systems negative except from HPI and PMH  Physical Exam BP (!) 142/80   Pulse 66   Ht 6'  4" (1.93 m)   Wt 243 lb (110.2 kg)   SpO2 97%   BMI 29.58 kg/m  Well developed and nourished in no acute distress HENT normal Neck supple  Clear Regular rate and rhythm, no murmurs or gallops Abd-soft with active BS No Clubbing cyanosis edema Skin-warm and dry A & Oriented  Grossly normal sensory and motor function  ECG sinus at 64 Interval 22/10/43 Left axis of -62 Low voltage  Assessment and  Plan SVT   Bradycardia-nocturnal  Syncope  Hypertension  PVCs  Anxiety    No recurrent syncope.  No recurrent palpitations.  Continue diltiazem at 180 mg a day which seems to be the minimal effective dose.  Continue lisinopril for blood pressure.  Elevated today, I was running late do not know if it is related to that he says at home is typically 130 range.  He follows up with Dr. Nicki Reaper regularly.  Will defer to her further management of that.  I think you are currently quiescient from a cardiac point of view, we will see him again as needed  Low voltage on his ECG is not coincidental with hypertrophy.  Likely just his size        Current medicines are reviewed at length with the patient today .  The patient does not  have concerns regarding medicines.

## 2022-08-07 NOTE — Patient Instructions (Signed)
Medication Instructions:  - Your physician recommends that you continue on your current medications as directed. Please refer to the Current Medication list given to you today.  *If you need a refill on your cardiac medications before your next appointment, please call your pharmacy*   Lab Work: - none ordered  If you have labs (blood work) drawn today and your tests are completely normal, you will receive your results only by: [place] (if you have MyChart) OR A paper copy in the mail If you have any lab test that is abnormal or we need to change your treatment, we will call you to review the results.   Testing/Procedures: - none orderd   Follow-Up: At [facility] you and your health needs are our priority.  As part of our continuing mission to provide you with exceptional heart care, we have created designated Provider Care Teams.  These Care Teams include your primary Cardiologist (physician) and Advanced Practice Providers (APPs -  Physician Assistants and Nurse Practitioners) who all work together to provide you with the care you need, when you need it.  We recommend signing up for the patient portal called "MyChart".  Sign up information is provided on this After Visit Summary.  MyChart is used to connect with patients for Virtual Visits (Telemedicine).  Patients are able to view lab/test results, encounter notes, upcoming appointments, etc.  Non-urgent messages can be sent to your provider as well.   To learn more about what you can do with MyChart, go to [email or web address].    Your next appointment:   As needed   The format for your next appointment:   In Person  Provider:   Virl Axe, MD    Other Instructions N/a  Important Information About Sugar

## 2022-08-15 ENCOUNTER — Other Ambulatory Visit: Payer: Self-pay | Admitting: Internal Medicine

## 2022-08-19 DIAGNOSIS — H353132 Nonexudative age-related macular degeneration, bilateral, intermediate dry stage: Secondary | ICD-10-CM | POA: Diagnosis not present

## 2022-09-02 ENCOUNTER — Other Ambulatory Visit: Payer: Self-pay | Admitting: Internal Medicine

## 2022-11-14 ENCOUNTER — Other Ambulatory Visit (INDEPENDENT_AMBULATORY_CARE_PROVIDER_SITE_OTHER): Payer: Medicare PPO

## 2022-11-14 DIAGNOSIS — E7800 Pure hypercholesterolemia, unspecified: Secondary | ICD-10-CM

## 2022-11-14 DIAGNOSIS — Z8546 Personal history of malignant neoplasm of prostate: Secondary | ICD-10-CM

## 2022-11-14 DIAGNOSIS — R739 Hyperglycemia, unspecified: Secondary | ICD-10-CM

## 2022-11-14 DIAGNOSIS — I10 Essential (primary) hypertension: Secondary | ICD-10-CM | POA: Diagnosis not present

## 2022-11-14 LAB — HEPATIC FUNCTION PANEL
ALT: 17 U/L (ref 0–53)
AST: 18 U/L (ref 0–37)
Albumin: 4.2 g/dL (ref 3.5–5.2)
Alkaline Phosphatase: 56 U/L (ref 39–117)
Bilirubin, Direct: 0.2 mg/dL (ref 0.0–0.3)
Total Bilirubin: 1 mg/dL (ref 0.2–1.2)
Total Protein: 6.7 g/dL (ref 6.0–8.3)

## 2022-11-14 LAB — PSA, MEDICARE: PSA: 0.21 ng/ml (ref 0.10–4.00)

## 2022-11-14 LAB — BASIC METABOLIC PANEL
BUN: 21 mg/dL (ref 6–23)
CO2: 27 mEq/L (ref 19–32)
Calcium: 9.5 mg/dL (ref 8.4–10.5)
Chloride: 106 mEq/L (ref 96–112)
Creatinine, Ser: 0.91 mg/dL (ref 0.40–1.50)
GFR: 79.91 mL/min (ref >60.00)
Glucose, Bld: 81 mg/dL (ref 70–99)
Potassium: 4.7 mEq/L (ref 3.5–5.1)
Sodium: 140 mEq/L (ref 135–145)

## 2022-11-14 LAB — LIPID PANEL
Cholesterol: 124 mg/dL (ref 0–200)
HDL: 48.3 mg/dL (ref >39.00)
LDL Cholesterol: 60 mg/dL (ref 0–99)
NonHDL: 75.39
Total CHOL/HDL Ratio: 3
Triglycerides: 76 mg/dL (ref 0.0–149.0)
VLDL: 15.2 mg/dL (ref 0.0–40.0)

## 2022-11-14 LAB — HEMOGLOBIN A1C: Hgb A1c MFr Bld: 5.7 % (ref 4.6–6.5)

## 2022-11-17 ENCOUNTER — Encounter: Payer: Self-pay | Admitting: Internal Medicine

## 2022-11-17 ENCOUNTER — Ambulatory Visit: Payer: Medicare PPO | Admitting: Internal Medicine

## 2022-11-17 VITALS — BP 128/70 | HR 90 | Temp 97.9°F | Resp 16 | Ht 76.0 in | Wt 243.0 lb

## 2022-11-17 DIAGNOSIS — I10 Essential (primary) hypertension: Secondary | ICD-10-CM

## 2022-11-17 DIAGNOSIS — I471 Supraventricular tachycardia, unspecified: Secondary | ICD-10-CM | POA: Diagnosis not present

## 2022-11-17 DIAGNOSIS — R739 Hyperglycemia, unspecified: Secondary | ICD-10-CM | POA: Diagnosis not present

## 2022-11-17 DIAGNOSIS — E7800 Pure hypercholesterolemia, unspecified: Secondary | ICD-10-CM | POA: Diagnosis not present

## 2022-11-17 DIAGNOSIS — Z8546 Personal history of malignant neoplasm of prostate: Secondary | ICD-10-CM | POA: Diagnosis not present

## 2022-11-17 DIAGNOSIS — K219 Gastro-esophageal reflux disease without esophagitis: Secondary | ICD-10-CM | POA: Diagnosis not present

## 2022-11-17 MED ORDER — LISINOPRIL 20 MG PO TABS: 20.0000 mg | ORAL_TABLET | Freq: Every day | ORAL | 3 refills | Status: DC

## 2022-11-17 MED ORDER — PANTOPRAZOLE SODIUM 40 MG PO TBEC: 40.0000 mg | DELAYED_RELEASE_TABLET | Freq: Every day | ORAL | 3 refills | Status: DC

## 2022-11-17 MED ORDER — ROSUVASTATIN CALCIUM 10 MG PO TABS: 10.0000 mg | ORAL_TABLET | Freq: Every day | ORAL | 3 refills | Status: DC

## 2022-11-17 MED ORDER — DILTIAZEM HCL ER COATED BEADS 180 MG PO CP24: 180.0000 mg | ORAL_CAPSULE | Freq: Every day | ORAL | 3 refills | Status: DC

## 2022-11-17 NOTE — Progress Notes (Signed)
Subjective:    Patient ID: Edward James, male    DOB: March 17, 1941, 82 y.o.   MRN: [id number]  Patient here for  Chief Complaint  Patient presents with   Medical Management of Chronic Issues    HPI Here to follow up regarding hypercholesterolemia, afib and hypertension.  He is doing well.  Stays active. Saw Dr Caryl Comes 08/07/22 - f/u SVT.  No recurrent syncope.  No recurrent palpitations. Recommended continuing diltiazem 180mg  q day.  Denies chest pain.  Breathing stable.  No abdominal pain or bowel change.  Discussed labs.  PSA slightly increased.     Past Medical History:  Diagnosis Date   Atrial fibrillation [facility])    Diverticulosis    Dysrhythmia    GERD (gastroesophageal reflux disease)    Hypercholesterolemia    Hypertension    Prostate cancer [facility])    s/p radical retropubic prostatectomy 1999   Spinal stenosis    Past Surgical History:  Procedure Laterality Date   BIOPSY  05/19/2019   Procedure: BIOPSY;  Surgeon: Laurence Spates, MD;  [facility]: [facility];  Service: Endoscopy;;   COLONOSCOPY WITH PROPOFOL N/A 05/19/2019   Procedure: COLONOSCOPY WITH PROPOFOL;  Surgeon: Laurence Spates, MD;  [facility];  Service: Endoscopy;  Laterality: N/A;   ESOPHAGOGASTRODUODENOSCOPY (EGD) WITH PROPOFOL N/A 05/19/2019   Procedure: ESOPHAGOGASTRODUODENOSCOPY (EGD) WITH PROPOFOL;  Surgeon: Laurence Spates, MD;  [facility];  Service: Endoscopy;  Laterality: N/A;   FOLEY CATHETER     INGUINAL HERNIA REPAIR  1945   INGUINAL HERNIA REPAIR Left 12/04/2015   Procedure: HERNIA REPAIR INGUINAL ADULT;  Surgeon: Leonie Green, MD;  Location: [facility];  Service: General;  Laterality: Left;   LUMBAR FUSION     L3-4 fusion   pilonidal cyst removal  1969   POLYPECTOMY  05/19/2019   Procedure: POLYPECTOMY;  Surgeon: Laurence Spates, MD;  [facility];  Service: Endoscopy;;   RETROPUBIC PROSTATECTOMY     radical (1999)   [place]   Family  History  Problem Relation Age of Onset   Arthritis Mother    Dementia Mother    AAA (abdominal aortic aneurysm) Father    Nephrolithiasis Other    Colon cancer Neg Hx    Social History   Socioeconomic History   Marital status: Married    Spouse name: Not on file   Number of children: 3   Years of education: Not on file   Highest education level: Not on file  Occupational History   Not on file  Tobacco Use   Smoking status: Former    Types: Cigarettes    Quit date: 11/28/1973    Years since quitting: 49.0   Smokeless tobacco: Never  Vaping Use   Vaping Use: Never used  Substance and Sexual Activity   Alcohol use: Not Currently    Comment: rarely   Drug use: No   Sexual activity: Not on file  Other Topics Concern   Not on file  Social History Narrative   Not on file   Social Determinants of Health   Financial Resource Strain: Low Risk  (11/22/2020)   Overall Financial Resource Strain (CARDIA)    Difficulty of Paying Living Expenses: Not hard at all  Food Insecurity: No [place] (11/22/2020)   Hunger Vital Sign    Worried About Running Out of Food in the Last Year: Never true    [place] in the Last Year: Never true  Transportation Needs:  No Transportation Needs (11/22/2020)   PRAPARE - Hydrologist (Medical): No    Lack of Transportation (Non-Medical): No  Physical Activity: Not on file  Stress: No Stress Concern Present (11/22/2020)   [place]    Feeling of Stress : Not at all  Social Connections: Unknown (11/22/2020)   Social Connection and Isolation Panel [NHANES]    Frequency of Communication with Friends and Family: Not on file    Frequency of Social Gatherings with Friends and Family: Not on file    Attends Religious Services: Not on file    Active Member of Clubs or Organizations: Not on file    Attends Archivist Meetings: Not on file     Marital Status: Married     Review of Systems  Constitutional:  Negative for appetite change and unexpected weight change.  HENT:  Negative for congestion and sinus pressure.   Respiratory:  Negative for cough, chest tightness and shortness of breath.   Cardiovascular:  Negative for chest pain and palpitations.  Gastrointestinal:  Negative for abdominal pain, diarrhea, nausea and vomiting.  Genitourinary:  Negative for difficulty urinating and dysuria.  Musculoskeletal:  Negative for joint swelling and myalgias.  Skin:  Negative for color change and rash.  Neurological:  Negative for dizziness and headaches.  Psychiatric/Behavioral:  Negative for agitation and dysphoric mood.        Objective:     BP 128/70   Pulse 90   Temp 97.9 F (36.6 C)   Resp 16   Ht 6\' 4"  (1.93 m)   Wt 243 lb (110.2 kg)   SpO2 98%   BMI 29.58 kg/m  Wt Readings from Last 3 Encounters:  11/17/22 243 lb (110.2 kg)  08/07/22 243 lb (110.2 kg)  07/09/22 240 lb 9.6 oz (109.1 kg)    Physical Exam Vitals reviewed.  Constitutional:      General: He is not in acute distress.    Appearance: Normal appearance. He is well-developed.  HENT:     Head: Normocephalic and atraumatic.     Right Ear: External ear normal.     Left Ear: External ear normal.  Eyes:     General: No scleral icterus.       Right eye: No discharge.        Left eye: No discharge.     Conjunctiva/sclera: Conjunctivae normal.  Cardiovascular:     Rate and Rhythm: Normal rate and regular rhythm.  Pulmonary:     Effort: Pulmonary effort is normal. No respiratory distress.     Breath sounds: Normal breath sounds.  Abdominal:     General: Bowel sounds are normal.     Palpations: Abdomen is soft.     Tenderness: There is no abdominal tenderness.  Musculoskeletal:        General: No swelling or tenderness.     Cervical back: Neck supple. No tenderness.  Lymphadenopathy:     Cervical: No cervical adenopathy.  Skin:    Findings: No  erythema or rash.  Neurological:     Mental Status: He is alert.  Psychiatric:        Mood and Affect: Mood normal.        Behavior: Behavior normal.      Outpatient Encounter Medications as of 11/17/2022  Medication Sig   diltiazem (CARDIZEM CD) 180 MG 24 hr capsule Take 1 capsule (180 mg total) by mouth daily.   lisinopril (  ZESTRIL) 20 MG tablet Take 1 tablet (20 mg total) by mouth daily.   Multiple Vitamins-Minerals (PRESERVISION AREDS PO) Take 1 tablet by mouth daily.   Omega-3 Fatty Acids (FISH OIL) 1200 MG CAPS Take 1,200 mg by mouth 2 (two) times daily.   pantoprazole (PROTONIX) 40 MG tablet Take 1 tablet (40 mg total) by mouth daily.   Probiotic Product (ALIGN) 4 MG CAPS Take 4 mg by mouth every evening.   rosuvastatin (CRESTOR) 10 MG tablet Take 1 tablet (10 mg total) by mouth daily.   triamcinolone (NASACORT) 55 MCG/ACT AERO nasal inhaler Place 2 sprays into the nose at bedtime.   [DISCONTINUED] diltiazem (CARDIZEM CD) 180 MG 24 hr capsule TAKE 1 CAPSULE BY MOUTH DAILY   [DISCONTINUED] lisinopril (ZESTRIL) 20 MG tablet TAKE 1 TABLET BY MOUTH DAILY   [DISCONTINUED] pantoprazole (PROTONIX) 40 MG tablet TAKE 1 TABLET BY MOUTH DAILY   [DISCONTINUED] rosuvastatin (CRESTOR) 10 MG tablet TAKE 1 TABLET BY MOUTH DAILY   No facility-administered encounter medications on file as of 11/17/2022.     Lab Results  Component Value Date   WBC 6.6 03/13/2022   HGB 14.5 03/13/2022   HCT 43.3 03/13/2022   PLT 214.0 03/13/2022   GLUCOSE 81 11/14/2022   CHOL 124 11/14/2022   TRIG 76.0 11/14/2022   HDL 48.30 11/14/2022   LDLCALC 60 11/14/2022   ALT 17 11/14/2022   AST 18 11/14/2022   NA 140 11/14/2022   K 4.7 11/14/2022   CL 106 11/14/2022   CREATININE 0.91 11/14/2022   BUN 21 11/14/2022   CO2 27 11/14/2022   TSH 2.62 03/13/2022   PSA 0.21 11/14/2022   HGBA1C 5.7 11/14/2022    No results found.     Assessment & Plan:  Hypercholesterolemia Assessment & Plan: Continue  crestor.  Low cholesterol diet and exercise.  Follow lipid panel and liver function tests.   Orders: -     Lipid panel; Future -     Hepatic function panel; Future  Hyperglycemia Assessment & Plan: Low carb diet and exercise.  Follow met b and a1c.   Orders: -     Hemoglobin A1c; Future  Primary hypertension Assessment & Plan: Continue diltiazem and lisinopril.  Continue current medication.  Follow pressures.  Follow metabolic panel.    Orders: -     Basic metabolic panel; Future  Gastroesophageal reflux disease, unspecified whether esophagitis present Assessment & Plan: No upper symptoms reported.  On protonix.    History of prostate cancer Assessment & Plan: PSA 07/03/22 - .04.  Recent check 10/2022 - .21. Given increase, will refer back to Dr Bernardo Heater for further f/u and question of need for any further testing or evaluation.   Orders: -     Ambulatory referral to Urology  SVT (supraventricular tachycardia) Assessment & Plan: Sees Dr Quentin Ore.  Elected to monitor and hold on ablation.  Continues to want to monitor. Continue diltiazem.    Other orders -     dilTIAZem HCl ER Coated Beads; Take 1 capsule (180 mg total) by mouth daily.  Dispense: 90 capsule; Refill: 3 -     Lisinopril; Take 1 tablet (20 mg total) by mouth daily.  Dispense: 90 tablet; Refill: 3 -     Pantoprazole Sodium; Take 1 tablet (40 mg total) by mouth daily.  Dispense: 90 tablet; Refill: 3 -     Rosuvastatin Calcium; Take 1 tablet (10 mg total) by mouth daily.  Dispense: 90 tablet; Refill: 3  Avanna Sowder, MD 

## 2022-11-23 ENCOUNTER — Encounter: Payer: Self-pay | Admitting: Internal Medicine

## 2022-11-23 NOTE — Assessment & Plan Note (Signed)
No upper symptoms reported.  On protonix.   

## 2022-11-23 NOTE — Assessment & Plan Note (Signed)
Continue crestor.  Low cholesterol diet and exercise. Follow lipid panel and liver function tests.   

## 2022-11-23 NOTE — Assessment & Plan Note (Signed)
Low carb diet and exercise.  Follow met b and a1c.   

## 2022-11-23 NOTE — Assessment & Plan Note (Signed)
Continue diltiazem and lisinopril.  Continue current medication.  Follow pressures.  Follow metabolic panel.   

## 2022-11-23 NOTE — Assessment & Plan Note (Signed)
Sees Dr Lambert.  Elected to monitor and hold on ablation.  Continues to want to monitor. Continue diltiazem.  

## 2022-11-23 NOTE — Assessment & Plan Note (Signed)
PSA 07/03/22 - .04.  Recent check 10/2022 - .21. Given increase, will refer back to Dr Bernardo Heater for further f/u and question of need for any further testing or evaluation.

## 2022-12-28 ENCOUNTER — Encounter: Payer: Self-pay | Admitting: Internal Medicine

## 2023-01-08 DIAGNOSIS — D2271 Melanocytic nevi of right lower limb, including hip: Secondary | ICD-10-CM | POA: Diagnosis not present

## 2023-01-08 DIAGNOSIS — D225 Melanocytic nevi of trunk: Secondary | ICD-10-CM | POA: Diagnosis not present

## 2023-01-08 DIAGNOSIS — D2262 Melanocytic nevi of left upper limb, including shoulder: Secondary | ICD-10-CM | POA: Diagnosis not present

## 2023-01-08 DIAGNOSIS — Z08 Encounter for follow-up examination after completed treatment for malignant neoplasm: Secondary | ICD-10-CM | POA: Diagnosis not present

## 2023-01-08 DIAGNOSIS — Z85828 Personal history of other malignant neoplasm of skin: Secondary | ICD-10-CM | POA: Diagnosis not present

## 2023-01-08 DIAGNOSIS — L821 Other seborrheic keratosis: Secondary | ICD-10-CM | POA: Diagnosis not present

## 2023-01-08 DIAGNOSIS — D2272 Melanocytic nevi of left lower limb, including hip: Secondary | ICD-10-CM | POA: Diagnosis not present

## 2023-01-08 DIAGNOSIS — D2261 Melanocytic nevi of right upper limb, including shoulder: Secondary | ICD-10-CM | POA: Diagnosis not present

## 2023-01-08 DIAGNOSIS — L570 Actinic keratosis: Secondary | ICD-10-CM | POA: Diagnosis not present

## 2023-03-20 ENCOUNTER — Other Ambulatory Visit (INDEPENDENT_AMBULATORY_CARE_PROVIDER_SITE_OTHER): Payer: Medicare PPO

## 2023-03-20 DIAGNOSIS — E7800 Pure hypercholesterolemia, unspecified: Secondary | ICD-10-CM | POA: Diagnosis not present

## 2023-03-20 DIAGNOSIS — R739 Hyperglycemia, unspecified: Secondary | ICD-10-CM

## 2023-03-20 DIAGNOSIS — I10 Essential (primary) hypertension: Secondary | ICD-10-CM

## 2023-03-20 NOTE — Addendum Note (Signed)
Addended by: Warden Fillers on: 03/20/2023 11:37 AM   Modules accepted: Orders

## 2023-03-21 LAB — LIPID PANEL
Cholesterol: 122 mg/dL (ref <200)
HDL: 52 mg/dL (ref >=40)
LDL Cholesterol (Calc): 54 mg/dL (calc)
Non-HDL Cholesterol (Calc): 70 mg/dL (calc) (ref <130)
Total CHOL/HDL Ratio: 2.3 (calc) (ref <5.0)
Triglycerides: 80 mg/dL (ref <150)

## 2023-03-21 LAB — HEPATIC FUNCTION PANEL
AG Ratio: 1.8 (calc) (ref 1.0–2.5)
ALT: 20 U/L (ref 9–46)
AST: 16 U/L (ref 10–35)
Albumin: 4.2 g/dL (ref 3.6–5.1)
Alkaline phosphatase (APISO): 56 U/L (ref 35–144)
Bilirubin, Direct: 0.2 mg/dL (ref 0.0–0.2)
Globulin: 2.4 g/dL (calc) (ref 1.9–3.7)
Indirect Bilirubin: 0.7 mg/dL (calc) (ref 0.2–1.2)
Total Bilirubin: 0.9 mg/dL (ref 0.2–1.2)
Total Protein: 6.6 g/dL (ref 6.1–8.1)

## 2023-03-21 LAB — BASIC METABOLIC PANEL WITH GFR
BUN: 19 mg/dL (ref 7–25)
CO2: 26 mmol/L (ref 20–32)
Calcium: 9.3 mg/dL (ref 8.6–10.3)
Chloride: 107 mmol/L (ref 98–110)
Creat: 0.96 mg/dL (ref 0.70–1.22)
Glucose, Bld: 87 mg/dL (ref 65–99)
Potassium: 4.8 mmol/L (ref 3.5–5.3)
Sodium: 140 mmol/L (ref 135–146)
eGFR: 80 mL/min/{1.73_m2} (ref >=60)

## 2023-03-21 LAB — HEMOGLOBIN A1C
Hgb A1c MFr Bld: 5.8 % of total Hgb — ABNORMAL HIGH (ref <5.7)
Mean Plasma Glucose: 120 mg/dL
eAG (mmol/L): 6.6 mmol/L

## 2023-03-22 NOTE — Progress Notes (Signed)
Subjective:    Patient ID: Edward James, male    DOB: 1940/10/08, 82 y.o.   MRN: [id number]  Patient here for  Chief Complaint  Patient presents with   Medical Management of Chronic Issues    HPI Here to follow up regarding hypercholesterolemia, afib and hypertension. Saw Dr Graciela Husbands 08/07/22 - f/u SVT. No recurrent syncope. No recurrent palpitations. Recommended continuing diltiazem 180mg  q day.  No chest pain.  Breathing stable.  No increased heart rate or palpitations reported.  No abdominal pain.  Bowels stable. PSA increased - last check.  Discussed f/u with Dr Lonna Cobb.  Recommended to repeat PSA in 6 months. Does report some low back pain.  No radicular symptoms.  Desires no further intervention at this time.  If he sits and rests, better.    Past Medical History:  Diagnosis Date   Atrial fibrillation [facility])    Diverticulosis    Dysrhythmia    GERD (gastroesophageal reflux disease)    Hypercholesterolemia    Hypertension    Prostate cancer [facility])    s/p radical retropubic prostatectomy 1999   Spinal stenosis    Past Surgical History:  Procedure Laterality Date   BIOPSY  05/19/2019   Procedure: BIOPSY;  Surgeon: Carman Ching, MD;  [facility];  Service: Endoscopy;;   COLONOSCOPY WITH PROPOFOL N/A 05/19/2019   Procedure: COLONOSCOPY WITH PROPOFOL;  Surgeon: Carman Ching, MD;  [facility];  Service: Endoscopy;  Laterality: N/A;   ESOPHAGOGASTRODUODENOSCOPY (EGD) WITH PROPOFOL N/A 05/19/2019   Procedure: ESOPHAGOGASTRODUODENOSCOPY (EGD) WITH PROPOFOL;  Surgeon: Carman Ching, MD;  [facility];  Service: Endoscopy;  Laterality: N/A;   FOLEY CATHETER     INGUINAL HERNIA REPAIR  1945   INGUINAL HERNIA REPAIR Left 12/04/2015   Procedure: HERNIA REPAIR INGUINAL ADULT;  Surgeon: Nadeen Landau, MD;  Location: [facility];  Service: General;  Laterality: Left;   LUMBAR FUSION     L3-4 fusion   pilonidal cyst removal  1969   POLYPECTOMY  05/19/2019    Procedure: POLYPECTOMY;  Surgeon: Carman Ching, MD;  [facility];  Service: Endoscopy;;   RETROPUBIC PROSTATECTOMY     radical (1999)   TONSILLECTOMY AND ADENOIDECTOMY  1948   Family History  Problem Relation Age of Onset   Arthritis Mother    Dementia Mother    AAA (abdominal aortic aneurysm) Father    Nephrolithiasis Other    Colon cancer Neg Hx    Social History   Socioeconomic History   Marital status: Married    Spouse name: Not on file   Number of children: 3   Years of education: Not on file   Highest education level: Not on file  Occupational History   Not on file  Tobacco Use   Smoking status: Former    Current packs/day: 0.00    Types: Cigarettes    Quit date: 11/28/1973    Years since quitting: 49.3   Smokeless tobacco: Never  Vaping Use   Vaping status: Never Used  Substance and Sexual Activity   Alcohol use: Not Currently    Comment: rarely   Drug use: No   Sexual activity: Not on file  Other Topics Concern   Not on file  Social History Narrative   Not on file   Social Determinants of Health   Financial Resource Strain: Low Risk  (11/22/2020)   Overall Financial Resource Strain (CARDIA)    Difficulty of Paying Living Expenses: Not hard at all  Food Insecurity:  No Food Insecurity (11/22/2020)   Hunger Vital Sign    Worried About Running Out of Food in the Last Year: Never true    Ran Out of Food in the Last Year: Never true  Transportation Needs: No Transportation Needs (11/22/2020)   PRAPARE - Administrator, Civil Service (Medical): No    Lack of Transportation (Non-Medical): No  Physical Activity: Not on file  Stress: No Stress Concern Present (11/22/2020)   [employer] of Occupational Health - Occupational Stress Questionnaire    Feeling of Stress : Not at all  Social Connections: Unknown (11/22/2020)   Social Connection and Isolation Panel [NHANES]    Frequency of Communication with Friends and Family: Not on file     Frequency of Social Gatherings with Friends and Family: Not on file    Attends Religious Services: Not on file    Active Member of Clubs or Organizations: Not on file    Attends Banker Meetings: Not on file    Marital Status: Married     Review of Systems  Constitutional:  Negative for appetite change and unexpected weight change.  HENT:  Negative for congestion and sinus pressure.   Respiratory:  Negative for cough, chest tightness and shortness of breath.   Cardiovascular:  Negative for chest pain, palpitations and leg swelling.  Gastrointestinal:  Negative for abdominal pain, constipation, diarrhea and vomiting.  Genitourinary:  Negative for difficulty urinating and dysuria.  Musculoskeletal:  Positive for back pain. Negative for joint swelling and myalgias.  Skin:  Negative for color change and rash.  Neurological:  Negative for dizziness and headaches.  Psychiatric/Behavioral:  Negative for agitation and dysphoric mood.        Objective:     BP 134/72   Pulse 91   Temp 97.9 F (36.6 C)   Resp 16   Ht 6\' 4"  (1.93 m)   Wt 237 lb 3.2 oz (107.6 kg)   SpO2 99%   BMI 28.87 kg/m  Wt Readings from Last 3 Encounters:  03/23/23 237 lb 3.2 oz (107.6 kg)  11/17/22 243 lb (110.2 kg)  08/07/22 243 lb (110.2 kg)    Physical Exam Vitals reviewed.  Constitutional:      General: He is not in acute distress.    Appearance: Normal appearance. He is well-developed.  HENT:     Head: Normocephalic and atraumatic.     Right Ear: External ear normal.     Left Ear: External ear normal.  Eyes:     General: No scleral icterus.       Right eye: No discharge.        Left eye: No discharge.     Conjunctiva/sclera: Conjunctivae normal.  Cardiovascular:     Rate and Rhythm: Normal rate.     Comments: Irregular.   Pulmonary:     Effort: Pulmonary effort is normal. No respiratory distress.     Breath sounds: Normal breath sounds.  Abdominal:     General: Bowel sounds  are normal.     Palpations: Abdomen is soft.     Tenderness: There is no abdominal tenderness.  Musculoskeletal:        General: No swelling or tenderness.     Cervical back: Neck supple. No tenderness.  Lymphadenopathy:     Cervical: No cervical adenopathy.  Skin:    Findings: No erythema or rash.  Neurological:     Mental Status: He is alert.  Psychiatric:  Mood and Affect: Mood normal.        Behavior: Behavior normal.      Outpatient Encounter Medications as of 03/23/2023  Medication Sig   diltiazem (CARDIZEM CD) 180 MG 24 hr capsule Take 1 capsule (180 mg total) by mouth daily.   lisinopril (ZESTRIL) 20 MG tablet Take 1 tablet (20 mg total) by mouth daily.   Multiple Vitamins-Minerals (PRESERVISION AREDS PO) Take 2 tablets by mouth daily.   Omega-3 Fatty Acids (FISH OIL) 1200 MG CAPS Take 1,200 mg by mouth 2 (two) times daily.   pantoprazole (PROTONIX) 40 MG tablet Take 1 tablet (40 mg total) by mouth daily.   Probiotic Product (ALIGN) 4 MG CAPS Take 4 mg by mouth every evening.   rosuvastatin (CRESTOR) 10 MG tablet Take 1 tablet (10 mg total) by mouth daily.   triamcinolone (NASACORT) 55 MCG/ACT AERO nasal inhaler Place 2 sprays into the nose at bedtime.   No facility-administered encounter medications on file as of 03/23/2023.     Lab Results  Component Value Date   WBC 6.6 03/13/2022   HGB 14.5 03/13/2022   HCT 43.3 03/13/2022   PLT 214.0 03/13/2022   GLUCOSE 87 03/20/2023   CHOL 122 03/20/2023   TRIG 80 03/20/2023   HDL 52 03/20/2023   LDLCALC 54 03/20/2023   ALT 20 03/20/2023   AST 16 03/20/2023   NA 140 03/20/2023   K 4.8 03/20/2023   CL 107 03/20/2023   CREATININE 0.96 03/20/2023   BUN 19 03/20/2023   CO2 26 03/20/2023   TSH 2.62 03/13/2022   PSA 0.21 11/14/2022   HGBA1C 5.8 (H) 03/20/2023       Assessment & Plan:  Hypercholesterolemia Assessment & Plan: Continue crestor.  Low cholesterol diet and exercise.  Follow lipid panel and liver  function tests.   Orders: -     Lipid panel; Future -     CBC with Differential/Platelet; Future -     Basic metabolic panel; Future -     Hepatic function panel; Future -     TSH; Future  Hyperglycemia Assessment & Plan: Low carb diet and exercise.  Follow met b and a1c.   Orders: -     Hemoglobin A1c; Future  SVT (supraventricular tachycardia) Assessment & Plan: Sees Dr Lalla Brothers.  Elected to monitor and hold on ablation.  Continues to want to monitor. Continue diltiazem. Today - EKG with Afib - controlled ventricular rate.  Refer back to EP.  Start eliquis.   Orders: -     EKG 12-Lead  History of prostate cancer Assessment & Plan: PSA 07/03/22 - .04.  Recent check 10/2022 - .21. Discussed with Dr Lonna Cobb.  Recommended f/u PSA in 6 months.  Wants checked with next labs.   Orders: -     PSA, Medicare; Future  Atrial fibrillation, unspecified type [facility]) Assessment & Plan: Noted irregularity of heart beat on exam.  EKG - Afib - controlled ventricular response.  Start eliquis.  Continue cardizem. F/u with EP.    Gastroesophageal reflux disease, unspecified whether esophagitis present Assessment & Plan: No upper symptoms reported.  On protonix.    Primary hypertension Assessment & Plan: Continue diltiazem and lisinopril.  Continue current medication.  Follow pressures.  Follow metabolic panel.        Dale Ellsworth, MD

## 2023-03-23 ENCOUNTER — Ambulatory Visit: Payer: Medicare PPO | Admitting: Internal Medicine

## 2023-03-23 ENCOUNTER — Encounter: Payer: Self-pay | Admitting: Internal Medicine

## 2023-03-23 ENCOUNTER — Telehealth: Payer: Self-pay

## 2023-03-23 VITALS — BP 134/72 | HR 91 | Temp 97.9°F | Resp 16 | Ht 76.0 in | Wt 237.2 lb

## 2023-03-23 DIAGNOSIS — I4891 Unspecified atrial fibrillation: Secondary | ICD-10-CM | POA: Diagnosis not present

## 2023-03-23 DIAGNOSIS — R739 Hyperglycemia, unspecified: Secondary | ICD-10-CM

## 2023-03-23 DIAGNOSIS — Z8546 Personal history of malignant neoplasm of prostate: Secondary | ICD-10-CM

## 2023-03-23 DIAGNOSIS — E7800 Pure hypercholesterolemia, unspecified: Secondary | ICD-10-CM

## 2023-03-23 DIAGNOSIS — K219 Gastro-esophageal reflux disease without esophagitis: Secondary | ICD-10-CM | POA: Diagnosis not present

## 2023-03-23 DIAGNOSIS — I10 Essential (primary) hypertension: Secondary | ICD-10-CM

## 2023-03-23 DIAGNOSIS — I471 Supraventricular tachycardia, unspecified: Secondary | ICD-10-CM

## 2023-03-23 NOTE — Telephone Encounter (Signed)
Medication Samples have been provided to the patient.  Drug name: Elquis       Strength: 5 mg        Qty: 1 box  LOT: [id number]  Exp.Date: 12/31/2023  Dosing instructions: Take 1 tablet by mouth two times daily.   The patient has been instructed regarding the correct time, dose, and frequency of taking this medication, including desired effects and most common side effects.   Edward James 4:44 PM 03/23/2023

## 2023-03-25 ENCOUNTER — Encounter: Payer: Self-pay | Admitting: Internal Medicine

## 2023-03-25 MED ORDER — APIXABAN 5 MG PO TABS: 5.0000 mg | ORAL_TABLET | Freq: Two times a day (BID) | ORAL | 3 refills | Status: DC

## 2023-03-25 NOTE — Telephone Encounter (Signed)
Rx sent in for eliquis 5mg  bid.

## 2023-03-25 NOTE — Telephone Encounter (Signed)
Note incomplete, could not find anything regarding Eliquis.

## 2023-03-28 ENCOUNTER — Encounter: Payer: Self-pay | Admitting: Internal Medicine

## 2023-03-28 NOTE — Assessment & Plan Note (Signed)
Sees Dr Lalla Brothers.  Elected to monitor and hold on ablation.  Continues to want to monitor. Continue diltiazem. Today - EKG with Afib - controlled ventricular rate.  Refer back to EP.  Start eliquis.

## 2023-03-28 NOTE — Assessment & Plan Note (Signed)
Low carb diet and exercise.  Follow met b and a1c.   

## 2023-03-28 NOTE — Assessment & Plan Note (Signed)
PSA 07/03/22 - .04.  Recent check 10/2022 - .21. Discussed with Dr Lonna Cobb.  Recommended f/u PSA in 6 months.  Wants checked with next labs.

## 2023-03-28 NOTE — Assessment & Plan Note (Signed)
Noted irregularity of heart beat on exam.  EKG - Afib - controlled ventricular response.  Start eliquis.  Continue cardizem. F/u with EP.

## 2023-03-28 NOTE — Assessment & Plan Note (Signed)
Continue diltiazem and lisinopril.  Continue current medication.  Follow pressures.  Follow metabolic panel.   

## 2023-03-28 NOTE — Assessment & Plan Note (Signed)
Continue crestor.  Low cholesterol diet and exercise.  Follow lipid panel and liver function tests.  

## 2023-03-28 NOTE — Assessment & Plan Note (Signed)
No upper symptoms reported.  On protonix.   

## 2023-04-12 DIAGNOSIS — D6869 Other thrombophilia: Secondary | ICD-10-CM | POA: Insufficient documentation

## 2023-04-12 NOTE — H&P (View-Only) (Signed)
Cardiology Office Note Date:  04/14/2023  Patient ID:  Edward James, Edward James 1941/07/28, MRN [id number] PCP:  Dale [place], MD  Cardiologist:  Lorine Bears, MD Electrophysiologist: Sherryl Manges, MD    Chief Complaint: new afib  History of Present Illness: Edward James is a 82 y.o. male with PMH notable for parox AFib, SVT, PVCs, HTN ; seen today for Sherryl Manges, MD for acute visit due to AFib.   He last saw Dr. Graciela Husbands 08/2022. His SVT had been well-controlled for many years on 180 dilt, was having episodes but much less symptomatic than previous. Rec to follow-up PRN with EP. He saw PCP 03/23/23, noted to be in AFib w controlled rate of 63 w PVC. He was started on eliquis at that visit.  On follow-up today, he overall feels well. Denies any overt cardiac symptoms - no palpitations, chest pain, chest pressure. No SOB, edema, syncope. He does have some slight increased fatigued in the summer, but that is typical for him in the hot summers.  He is tolerating eliquis well, no missed doses, no bleeding concerns. He does work in the yard and around the house and has small cuts and abrasions on legs and forearms.  He denies OSA symptoms, has slight snoring.  His wife joins him for today's appointment.   AAD History: none  Past Medical History:  Diagnosis Date   Atrial fibrillation (HCC)    Diverticulosis    Dysrhythmia    GERD (gastroesophageal reflux disease)    Hypercholesterolemia    Hypertension    Prostate cancer [facility])    s/p radical retropubic prostatectomy 1999   Spinal stenosis     Past Surgical History:  Procedure Laterality Date   BIOPSY  05/19/2019   Procedure: BIOPSY;  Surgeon: Carman Ching, MD;  Location: [facility];  Service: Endoscopy;;   COLONOSCOPY WITH PROPOFOL N/A 05/19/2019   Procedure: COLONOSCOPY WITH PROPOFOL;  Surgeon: Carman Ching, MD;  Location: [facility];  Service: Endoscopy;  Laterality: N/A;   ESOPHAGOGASTRODUODENOSCOPY (EGD) WITH PROPOFOL  N/A 05/19/2019   Procedure: ESOPHAGOGASTRODUODENOSCOPY (EGD) WITH PROPOFOL;  Surgeon: Carman Ching, MD;  [facility];  Service: Endoscopy;  Laterality: N/A;   FOLEY CATHETER     INGUINAL HERNIA REPAIR  1945   INGUINAL HERNIA REPAIR Left 12/04/2015   Procedure: HERNIA REPAIR INGUINAL ADULT;  Surgeon: Nadeen Landau, MD;  Location: [facility];  Service: General;  Laterality: Left;   LUMBAR FUSION     L3-4 fusion   pilonidal cyst removal  1969   POLYPECTOMY  05/19/2019   Procedure: POLYPECTOMY;  Surgeon: Carman Ching, MD;  [facility]: [facility];  Service: Endoscopy;;   RETROPUBIC PROSTATECTOMY     radical (1999)   TONSILLECTOMY AND ADENOIDECTOMY  1948    Current Outpatient Medications  Medication Instructions   Align 4 mg, Oral, Every evening   apixaban (ELIQUIS) 5 mg, Oral, 2 times daily   diltiazem (CARDIZEM CD) 180 mg, Oral, Daily   Fish Oil 1,200 mg, Oral, 2 times daily   lisinopril (ZESTRIL) 20 mg, Oral, Daily   Multiple Vitamins-Minerals (PRESERVISION AREDS PO) 2 tablets, Oral, Daily   pantoprazole (PROTONIX) 40 mg, Oral, Daily   rosuvastatin (CRESTOR) 10 mg, Oral, Daily   triamcinolone (NASACORT) 55 MCG/ACT AERO nasal inhaler 2 sprays, Nasal, Daily at bedtime    Social History:  The patient  reports that he quit smoking about 49 years ago. His smoking use included cigarettes. He has never used smokeless tobacco. He reports that he does  not currently use alcohol. He reports that he does not use drugs.   Family History:  The patient's family history includes AAA (abdominal aortic aneurysm) in his father; Arthritis in his mother; Dementia in his mother; Nephrolithiasis in an other family member.  ROS:  Please see the history of present illness. All other systems are reviewed and otherwise negative.   PHYSICAL EXAM:  VS:  BP 138/68 (BP Location: Left Arm, Patient Position: Sitting)   Pulse 70   Ht 6\' 4"  (1.93 m)   Wt 239 lb 9.6 oz (108.7 kg)   SpO2 98%   BMI  29.17 kg/m  BMI: Body mass index is 29.17 kg/m.  GEN- The patient is well appearing, alert and oriented x 3 today.   Lungs- Clear to ausculation bilaterally, normal work of breathing.  Heart- Irregularly irregular rate and rhythm, no murmurs, rubs or gallops Extremities- No peripheral edema, warm, dry   EKG is ordered. Personal review of EKG from today shows:    EKG Interpretation Date/Time:  Tuesday April 14 2023 13:04:12 EDT Ventricular Rate:  70 PR Interval:    QRS Duration:  108 QT Interval:  410 QTC Calculation: 442 R Axis:   -46  Text Interpretation: Atrial fibrillation Left anterior fascicular block Cannot rule out Inferior infarct , age undetermined Confirmed by Sherie Don [phone]) on 04/14/2023 1:10:20 PM    Recent Labs: 03/20/2023: ALT 20; BUN 19; Creat 0.96; Potassium 4.8; Sodium 140  11/14/2022: VLDL 15.2 03/20/2023: Cholesterol 122; HDL 52; LDL Cholesterol (Calc) 54; Total CHOL/HDL Ratio 2.3; Triglycerides 80   CrCl cannot be calculated (Patient's most recent lab result is older than the maximum 21 days allowed.).   Wt Readings from Last 3 Encounters:  04/14/23 239 lb 9.6 oz (108.7 kg)  03/23/23 237 lb 3.2 oz (107.6 kg)  11/17/22 243 lb (110.2 kg)     Additional studies reviewed include: Previous EP, cardiology notes.   Long term monitor, 06/11/2018 Normal sinus rhythm with an average heart rate of 57 bpm. 64 episodes of supraventricular tachycardia were noted.  The longest lasted 5 minutes and 16 seconds with an average rate of 162 bpm. Rare PVCs.  NM myocardial spect, 06/04/2018 Blood pressure demonstrated a hypertensive response to exercise. The study is normal. This is a low risk study. The left ventricular ejection fraction is normal (55-65%). The patient had baseline sinus bradycardia with heart rate of 47 bpm. He developed SVT with exercise with a heart rate around 188 bpm. This resolved with vagal maneuvers and recovery. He also had PVCs with  exercise and during recovery. Recommend EP evaluation for SVT.  TTE, 10/18/2015 - Left ventricle: The cavity size was normal. Systolic function was normal. The estimated ejection fraction was in the range of 55% to 60%. Wall motion was normal; there were no regional wall motion abnormalities. Doppler parameters are consistent with abnormal left ventricular relaxation (grade 1 diastolic dysfunction).  - Aortic valve: There was mild regurgitation.  - Aortic root: The aortic root was mildly dilated.  - Mitral valve: There was mild regurgitation.  - Pulmonary arteries: Systolic pressure was within the normal range.   ASSESSMENT AND PLAN:  #) parox AFib #) SVT No SVT episodes as of late Asymptomatic of afib, controlled ventricular rates Continue 180 dilt   He has not missed any eliquis doses, starting on 7/22. Discussed DCCV, patient is agreeable to proceeding with procedure, will schedule for after 8/19 when patient has been anticoagulated for 4 complete weeks. - preprocedure labs  today Will update echo given new afib   #) Hypercoag d/t parox afib CHA2DS2-VASc Score = 3 [CHF History: 0, HTN History: 1, Diabetes History: 0, Stroke History: 0, Vascular Disease History: 0, Age Score: 2, Gender Score: 0].  Therefore, the patient's annual risk of stroke is 3.2 %.    Stroke ppx - 5mg  eliquis BID, appropriately dosed No bleeding concerns Update CBC    Informed Consent   Shared Decision Making/Informed Consent The risks (stroke, cardiac arrhythmias rarely resulting in the need for a temporary or permanent pacemaker, skin irritation or burns and complications associated with conscious sedation including aspiration, arrhythmia, respiratory failure and death), benefits (restoration of normal sinus rhythm) and alternatives of a direct current cardioversion were explained in detail to Mr. Glazebrook and he agrees to proceed.        Current medicines are reviewed at length with the patient today.    The patient does not have concerns regarding his medicines.  The following changes were made today:  none  Labs/ tests ordered today include:  Orders Placed This Encounter  Procedures   Magnesium   CBC   TSH   Basic metabolic panel   EKG 12-Lead   ECHOCARDIOGRAM COMPLETE     Disposition: Follow up with EP APP as usual post procedure   Signed, Sherie Don, NP  04/14/23  2:35 PM  Electrophysiology [facility]

## 2023-04-12 NOTE — Progress Notes (Signed)
Cardiology Office Note Date:  04/14/2023  Patient ID:  Edward James, Edward James Nov 11, 1940, MRN [id number] PCP:  Dale [place], MD  Cardiologist:  Lorine Bears, MD Electrophysiologist: Sherryl Manges, MD    Chief Complaint: new afib  History of Present Illness: Edward James is a 82 y.o. male with PMH notable for parox AFib, SVT, PVCs, HTN ; seen today for Sherryl Manges, MD for acute visit due to AFib.   He last saw Dr. Graciela Husbands 08/2022. His SVT had been well-controlled for many years on 180 dilt, was having episodes but much less symptomatic than previous. Rec to follow-up PRN with EP. He saw PCP 03/23/23, noted to be in AFib w controlled rate of 63 w PVC. He was started on eliquis at that visit.  On follow-up today, he overall feels well. Denies any overt cardiac symptoms - no palpitations, chest pain, chest pressure. No SOB, edema, syncope. He does have some slight increased fatigued in the summer, but that is typical for him in the hot summers.  He is tolerating eliquis well, no missed doses, no bleeding concerns. He does work in the yard and around the house and has small cuts and abrasions on legs and forearms.  He denies OSA symptoms, has slight snoring.  His wife joins him for today's appointment.   AAD History: none  Past Medical History:  Diagnosis Date   Atrial fibrillation (HCC)    Diverticulosis    Dysrhythmia    GERD (gastroesophageal reflux disease)    Hypercholesterolemia    Hypertension    Prostate cancer [facility])    s/p radical retropubic prostatectomy 1999   Spinal stenosis     Past Surgical History:  Procedure Laterality Date   BIOPSY  05/19/2019   Procedure: BIOPSY;  Surgeon: Carman Ching, MD;  [facility];  Service: Endoscopy;;   COLONOSCOPY WITH PROPOFOL N/A 05/19/2019   Procedure: COLONOSCOPY WITH PROPOFOL;  Surgeon: Carman Ching, MD;  [facility];  Service: Endoscopy;  Laterality: N/A;   ESOPHAGOGASTRODUODENOSCOPY (EGD) WITH PROPOFOL  N/A 05/19/2019   Procedure: ESOPHAGOGASTRODUODENOSCOPY (EGD) WITH PROPOFOL;  Surgeon: Carman Ching, MD;  [facility];  Service: Endoscopy;  Laterality: N/A;   FOLEY CATHETER     INGUINAL HERNIA REPAIR  1945   INGUINAL HERNIA REPAIR Left 12/04/2015   Procedure: HERNIA REPAIR INGUINAL ADULT;  Surgeon: Nadeen Landau, MD;  Location: [facility];  Service: General;  Laterality: Left;   LUMBAR FUSION     L3-4 fusion   pilonidal cyst removal  1969   POLYPECTOMY  05/19/2019   Procedure: POLYPECTOMY;  Surgeon: Carman Ching, MD;  [facility];  Service: Endoscopy;;   RETROPUBIC PROSTATECTOMY     radical (1999)   TONSILLECTOMY AND ADENOIDECTOMY  1948    Current Outpatient Medications  Medication Instructions   Align 4 mg, Oral, Every evening   apixaban (ELIQUIS) 5 mg, Oral, 2 times daily   diltiazem (CARDIZEM CD) 180 mg, Oral, Daily   Fish Oil 1,200 mg, Oral, 2 times daily   lisinopril (ZESTRIL) 20 mg, Oral, Daily   Multiple Vitamins-Minerals (PRESERVISION AREDS PO) 2 tablets, Oral, Daily   pantoprazole (PROTONIX) 40 mg, Oral, Daily   rosuvastatin (CRESTOR) 10 mg, Oral, Daily   triamcinolone (NASACORT) 55 MCG/ACT AERO nasal inhaler 2 sprays, Nasal, Daily at bedtime    Social History:  The patient  reports that he quit smoking about 49 years ago. His smoking use included cigarettes. He has never used smokeless tobacco. He reports that he does  not currently use alcohol. He reports that he does not use drugs.   Family History:  The patient's family history includes AAA (abdominal aortic aneurysm) in his father; Arthritis in his mother; Dementia in his mother; Nephrolithiasis in an other family member.  ROS:  Please see the history of present illness. All other systems are reviewed and otherwise negative.   PHYSICAL EXAM:  VS:  BP 138/68 (BP Location: Left Arm, Patient Position: Sitting)   Pulse 70   Ht 6\' 4"  (1.93 m)   Wt 239 lb 9.6 oz (108.7 kg)   SpO2 98%   BMI  29.17 kg/m  BMI: Body mass index is 29.17 kg/m.  GEN- The patient is well appearing, alert and oriented x 3 today.   Lungs- Clear to ausculation bilaterally, normal work of breathing.  Heart- Irregularly irregular rate and rhythm, no murmurs, rubs or gallops Extremities- No peripheral edema, warm, dry   EKG is ordered. Personal review of EKG from today shows:    EKG Interpretation Date/Time:  Tuesday April 14 2023 13:04:12 EDT Ventricular Rate:  70 PR Interval:    QRS Duration:  108 QT Interval:  410 QTC Calculation: 442 R Axis:   -46  Text Interpretation: Atrial fibrillation Left anterior fascicular block Cannot rule out Inferior infarct , age undetermined Confirmed by Sherie Don [phone]) on 04/14/2023 1:10:20 PM    Recent Labs: 03/20/2023: ALT 20; BUN 19; Creat 0.96; Potassium 4.8; Sodium 140  11/14/2022: VLDL 15.2 03/20/2023: Cholesterol 122; HDL 52; LDL Cholesterol (Calc) 54; Total CHOL/HDL Ratio 2.3; Triglycerides 80   CrCl cannot be calculated (Patient's most recent lab result is older than the maximum 21 days allowed.).   Wt Readings from Last 3 Encounters:  04/14/23 239 lb 9.6 oz (108.7 kg)  03/23/23 237 lb 3.2 oz (107.6 kg)  11/17/22 243 lb (110.2 kg)     Additional studies reviewed include: Previous EP, cardiology notes.   Long term monitor, 06/11/2018 Normal sinus rhythm with an average heart rate of 57 bpm. 64 episodes of supraventricular tachycardia were noted.  The longest lasted 5 minutes and 16 seconds with an average rate of 162 bpm. Rare PVCs.  NM myocardial spect, 06/04/2018 Blood pressure demonstrated a hypertensive response to exercise. The study is normal. This is a low risk study. The left ventricular ejection fraction is normal (55-65%). The patient had baseline sinus bradycardia with heart rate of 47 bpm. He developed SVT with exercise with a heart rate around 188 bpm. This resolved with vagal maneuvers and recovery. He also had PVCs with  exercise and during recovery. Recommend EP evaluation for SVT.  TTE, 10/18/2015 - Left ventricle: The cavity size was normal. Systolic function was normal. The estimated ejection fraction was in the range of 55% to 60%. Wall motion was normal; there were no regional wall motion abnormalities. Doppler parameters are consistent with abnormal left ventricular relaxation (grade 1 diastolic dysfunction).  - Aortic valve: There was mild regurgitation.  - Aortic root: The aortic root was mildly dilated.  - Mitral valve: There was mild regurgitation.  - Pulmonary arteries: Systolic pressure was within the normal range.   ASSESSMENT AND PLAN:  #) parox AFib #) SVT No SVT episodes as of late Asymptomatic of afib, controlled ventricular rates Continue 180 dilt   He has not missed any eliquis doses, starting on 7/22. Discussed DCCV, patient is agreeable to proceeding with procedure, will schedule for after 8/19 when patient has been anticoagulated for 4 complete weeks. - preprocedure labs  today Will update echo given new afib   #) Hypercoag d/t parox afib CHA2DS2-VASc Score = 3 [CHF History: 0, HTN History: 1, Diabetes History: 0, Stroke History: 0, Vascular Disease History: 0, Age Score: 2, Gender Score: 0].  Therefore, the patient's annual risk of stroke is 3.2 %.    Stroke ppx - 5mg  eliquis BID, appropriately dosed No bleeding concerns Update CBC    Informed Consent   Shared Decision Making/Informed Consent The risks (stroke, cardiac arrhythmias rarely resulting in the need for a temporary or permanent pacemaker, skin irritation or burns and complications associated with conscious sedation including aspiration, arrhythmia, respiratory failure and death), benefits (restoration of normal sinus rhythm) and alternatives of a direct current cardioversion were explained in detail to Mr. Bogucki and he agrees to proceed.        Current medicines are reviewed at length with the patient today.    The patient does not have concerns regarding his medicines.  The following changes were made today:  none  Labs/ tests ordered today include:  Orders Placed This Encounter  Procedures   Magnesium   CBC   TSH   Basic metabolic panel   EKG 12-Lead   ECHOCARDIOGRAM COMPLETE     Disposition: Follow up with EP APP as usual post procedure   Signed, Sherie Don, NP  04/14/23  2:35 PM  Electrophysiology [facility]

## 2023-04-14 ENCOUNTER — Ambulatory Visit: Payer: Medicare PPO | Attending: Cardiology | Admitting: Cardiology

## 2023-04-14 VITALS — BP 138/68 | HR 70 | Ht 76.0 in | Wt 239.6 lb

## 2023-04-14 DIAGNOSIS — I471 Supraventricular tachycardia, unspecified: Secondary | ICD-10-CM | POA: Diagnosis not present

## 2023-04-14 DIAGNOSIS — D6869 Other thrombophilia: Secondary | ICD-10-CM

## 2023-04-14 DIAGNOSIS — I480 Paroxysmal atrial fibrillation: Secondary | ICD-10-CM

## 2023-04-14 MED ORDER — APIXABAN 5 MG PO TABS: 5.0000 mg | ORAL_TABLET | Freq: Two times a day (BID) | ORAL | 3 refills | Status: DC

## 2023-04-14 NOTE — Patient Instructions (Signed)
Medication Instructions:  Your physician recommends that you continue on your current medications as directed. Please refer to the Current Medication list given to you today.   *If you need a refill on your cardiac medications before your next appointment, please call your pharmacy*   Lab Work: Your provider would like for you to have following labs drawn today (CBC, BMP, Mg, TSH).     Testing/Procedures: Your physician has requested that you have an echocardiogram. Echocardiography is a painless test that uses sound waves to create images of your heart. It provides your doctor with information about the size and shape of your heart and how well your heart's chambers and valves are working.   You may receive an ultrasound enhancing agent through an IV if needed to better visualize your heart during the echo. This procedure takes approximately one hour.  There are no restrictions for this procedure.  This will take place at [address], [place] [address]   Cardioversion Please see instructions below   Follow-Up: At [facility], you and your health needs are our priority.  As part of our continuing mission to provide you with exceptional heart care, we have created designated Provider Care Teams.  These Care Teams include your primary Cardiologist (physician) and Advanced Practice Providers (APPs -  Physician Assistants and Nurse Practitioners) who all work together to provide you with the care you need, when you need it.  We recommend signing up for the patient portal called "MyChart".  Sign up information is provided on this After Visit Summary.  MyChart is used to connect with patients for Virtual Visits (Telemedicine).  Patients are able to view lab/test results, encounter notes, upcoming appointments, etc.  Non-urgent messages can be sent to your provider as well.   To learn more about what you can do with MyChart, go to  [email or web address].    Your next appointment:   3-4 week(s)  Provider:   Sherie Don, NP        Dear Edward James  You are scheduled for a Cardioversion on Monday, August 26 with Dr. Azucena Cecil.  Please arrive at the [facility] Entrance of [facility], [address], [place] [address] at 6:30 AM (This is 1 hour(s) prior to your procedure time).  Proceed to the Check-In Desk directly inside the entrance.  Procedure Parking: Use the entrance off of the [address] side of the hospital. Turn right upon entering and follow the driveway to parking that is directly in front of the [facility]. There is no valet parking available at this entrance, however there is an awning directly in front of the [facility] for drop off/ pick up for patients.    DIET:  Nothing to eat or drink after midnight except a sip of water with medications (see medication instructions below)        :1}Continue taking your anticoagulant (blood thinner): Apixaban (Eliquis).  You will need to continue this after your procedure until you are told by your provider that it is safe to stop.    FYI:  For your safety, and to allow [place] to monitor your vital signs accurately during the surgery/procedure we request: If you have artificial nails, gel coating, SNS etc, please have those removed prior to your surgery/procedure. Not having the nail coverings /polish removed may result in cancellation or delay of your surgery/procedure.  You must have a responsible person to drive you home  and stay in the waiting area during your procedure. Failure to do so could result in cancellation.  Bring your insurance cards.  *Special Note: Every effort is made to have your procedure done on time. Occasionally there are emergencies that occur at the hospital that may cause delays. Please be patient if a delay does occur.

## 2023-04-26 MED ORDER — SODIUM CHLORIDE 0.9 % IV SOLN: INTRAVENOUS | Status: DC

## 2023-04-27 ENCOUNTER — Other Ambulatory Visit: Payer: Self-pay

## 2023-04-27 ENCOUNTER — Ambulatory Visit
Admission: RE | Admit: 2023-04-27 | Discharge: 2023-04-27 | Disposition: A | Discharge status: Home / Self Care | Payer: Medicare PPO | Attending: Cardiology | Admitting: Cardiology

## 2023-04-27 ENCOUNTER — Ambulatory Visit: Payer: Medicare PPO | Admitting: Certified Registered Nurse Anesthetist

## 2023-04-27 ENCOUNTER — Encounter: Payer: Self-pay | Admitting: Cardiology

## 2023-04-27 ENCOUNTER — Encounter
Admission: RE | Disposition: A | Discharge status: Home / Self Care | Payer: Self-pay | Source: Home / Self Care | Attending: Cardiology

## 2023-04-27 DIAGNOSIS — Z87891 Personal history of nicotine dependence: Secondary | ICD-10-CM | POA: Diagnosis not present

## 2023-04-27 DIAGNOSIS — Z8546 Personal history of malignant neoplasm of prostate: Secondary | ICD-10-CM | POA: Insufficient documentation

## 2023-04-27 DIAGNOSIS — I4819 Other persistent atrial fibrillation: Secondary | ICD-10-CM | POA: Diagnosis not present

## 2023-04-27 DIAGNOSIS — I10 Essential (primary) hypertension: Secondary | ICD-10-CM | POA: Insufficient documentation

## 2023-04-27 DIAGNOSIS — I471 Supraventricular tachycardia, unspecified: Secondary | ICD-10-CM | POA: Insufficient documentation

## 2023-04-27 DIAGNOSIS — I480 Paroxysmal atrial fibrillation: Secondary | ICD-10-CM | POA: Insufficient documentation

## 2023-04-27 DIAGNOSIS — I444 Left anterior fascicular block: Secondary | ICD-10-CM | POA: Diagnosis not present

## 2023-04-27 DIAGNOSIS — Z7901 Long term (current) use of anticoagulants: Secondary | ICD-10-CM | POA: Diagnosis not present

## 2023-04-27 DIAGNOSIS — Z79899 Other long term (current) drug therapy: Secondary | ICD-10-CM | POA: Diagnosis not present

## 2023-04-27 DIAGNOSIS — K219 Gastro-esophageal reflux disease without esophagitis: Secondary | ICD-10-CM | POA: Diagnosis not present

## 2023-04-27 DIAGNOSIS — E7800 Pure hypercholesterolemia, unspecified: Secondary | ICD-10-CM | POA: Diagnosis not present

## 2023-04-27 DIAGNOSIS — I4891 Unspecified atrial fibrillation: Secondary | ICD-10-CM | POA: Diagnosis not present

## 2023-04-27 HISTORY — PX: CARDIOVERSION: SHX1299

## 2023-04-27 SURGERY — CARDIOVERSION
Anesthesia: General

## 2023-04-27 MED ORDER — PROPOFOL 10 MG/ML IV BOLUS
INTRAVENOUS | Status: DC | PRN
  Administered 2023-04-27: 50 mg via INTRAVENOUS

## 2023-04-27 MED ORDER — PROPOFOL 10 MG/ML IV BOLUS
INTRAVENOUS | Status: AC
  Filled 2023-04-27: qty 20

## 2023-04-27 NOTE — Interval H&P Note (Signed)
History and Physical Interval Note:  04/27/2023 7:46 AM  Edward James  has presented today for surgery, with the diagnosis of Afib.  The various methods of treatment have been discussed with the patient and family. After consideration of risks, benefits and other options for treatment, the patient has consented to  Procedure(s): CARDIOVERSION (N/A) as a surgical intervention.  The patient's history has been reviewed, patient examined, no change in status, stable for surgery.  I have reviewed the patient's chart and labs.  Questions were answered to the patient's satisfaction.     Arlys John Agbor-Etang

## 2023-04-27 NOTE — Anesthesia Procedure Notes (Signed)
Procedure Name: MAC Date/Time: 04/27/2023 7:24 AM  Performed by: Hezzie Bump, CRNAPre-anesthesia Checklist: Patient identified, Emergency Drugs available, Suction available and Patient being monitored Patient Re-evaluated:Patient Re-evaluated prior to induction Oxygen Delivery Method: Nasal cannula Induction Type: IV induction Placement Confirmation: positive ETCO2

## 2023-04-27 NOTE — Procedures (Signed)
Cardioversion procedure note For atrial fibrillation.  Procedure Details:  Consent: Risks of procedure as well as the alternatives and risks of each were explained to the (patient/caregiver).  Consent for procedure obtained.  Time Out: Verified patient identification, verified procedure, site/side was marked, verified correct patient position, special equipment/implants available, medications/allergies/relevent history reviewed, required imaging and test results available.  Performed  Patient placed on cardiac monitor, pulse oximetry, supplemental oxygen as necessary.   Sedation given: propofol IV per anesthesia team  Pacer pads placed anterior and posterior chest.   Cardioverted 1 time(s).   Cardioverted at  [place]. Synchronized biphasic Converted to NSR   Evaluation: Findings: Post procedure EKG shows: NSR Complications: None Patient did tolerate procedure well.  Time Spent Directly with the Patient:  25 minutes   Kate Sable, M.D.

## 2023-04-27 NOTE — Anesthesia Preprocedure Evaluation (Signed)
Anesthesia Evaluation  Patient identified by MRN, date of birth, ID band Patient awake    Reviewed: Allergy & Precautions, H&P , NPO status , Patient's Chart, lab work & pertinent test results, reviewed documented beta blocker date and time   Airway Mallampati: II   Neck ROM: full    Dental  (+) Poor Dentition   Pulmonary neg pulmonary ROS, former smoker   Pulmonary exam normal        Cardiovascular Exercise Tolerance: Poor hypertension, On Medications Normal cardiovascular exam+ dysrhythmias  Rhythm:regular Rate:Normal     Neuro/Psych negative neurological ROS  negative psych ROS   GI/Hepatic Neg liver ROS,GERD  Medicated,,  Endo/Other  negative endocrine ROS    Renal/GU negative Renal ROS  negative genitourinary   Musculoskeletal   Abdominal   Peds  Hematology negative hematology ROS (+)   Anesthesia Other Findings Past Medical History: No date: Atrial fibrillation (HCC) No date: Diverticulosis No date: Dysrhythmia No date: GERD (gastroesophageal reflux disease) No date: Hypercholesterolemia No date: Hypertension No date: Prostate cancer (HCC)     Comment:  s/p radical retropubic prostatectomy 1999 No date: Spinal stenosis Past Surgical History: 05/19/2019: BIOPSY     Comment:  Procedure: BIOPSY;  Surgeon: Carman Ching, MD;                [facility]: [facility];  Service: Endoscopy;; 05/19/2019: COLONOSCOPY WITH PROPOFOL; N/A     Comment:  Procedure: COLONOSCOPY WITH PROPOFOL;  Surgeon: Carman Ching, MD;  [facility];  Service: Endoscopy;               Laterality: N/A; 05/19/2019: ESOPHAGOGASTRODUODENOSCOPY (EGD) WITH PROPOFOL; N/A     Comment:  Procedure: ESOPHAGOGASTRODUODENOSCOPY (EGD) WITH               PROPOFOL;  Surgeon: Carman Ching, MD;  [facility];  Service: Endoscopy;  Laterality: N/A; No date: FOLEY CATHETER 1945: INGUINAL HERNIA  REPAIR 12/04/2015: INGUINAL HERNIA REPAIR; Left     Comment:  Procedure: HERNIA REPAIR INGUINAL ADULT;  Surgeon:               Nadeen Landau, MD;  Location: [facility];  Service:               General;  Laterality: Left; No date: LUMBAR FUSION     Comment:  L3-4 fusion 1969: pilonidal cyst removal 05/19/2019: POLYPECTOMY     Comment:  Procedure: POLYPECTOMY;  Surgeon: Carman Ching, MD;                [facility];  Service: Endoscopy;; No date: RETROPUBIC PROSTATECTOMY     Comment:  radical (1999) 1948: TONSILLECTOMY AND ADENOIDECTOMY BMI    Body Mass Index: 28.61 kg/m     Reproductive/Obstetrics negative OB ROS                             Anesthesia Physical Anesthesia Plan  ASA: 4  Anesthesia Plan: General   Post-op Pain Management:    Induction:   PONV Risk Score and Plan:   Airway Management Planned:   Additional Equipment:   Intra-op Plan:   Post-operative Plan:   Informed Consent: I have reviewed the patients History and Physical, chart, labs and discussed the procedure including the risks, benefits and alternatives  for the proposed anesthesia with the patient or authorized representative who has indicated his/her understanding and acceptance.     Dental Advisory Given  Plan Discussed with: CRNA  Anesthesia Plan Comments:        Anesthesia Quick Evaluation

## 2023-04-27 NOTE — Transfer of Care (Signed)
Immediate Anesthesia Transfer of Care Note  Patient: Hurl Ro  Procedure(s) Performed: CARDIOVERSION  Patient Location: Short Stay  Anesthesia Type:General  Level of Consciousness: drowsy  Airway & Oxygen Therapy: Patient Spontanous Breathing and Patient connected to nasal cannula oxygen  Post-op Assessment: Report given to RN and Post -op Vital signs reviewed and stable  Post vital signs: Reviewed and stable  Last Vitals:  Vitals Value Taken Time  BP 108/87 04/27/23 0742  Temp    Pulse 65 04/27/23 0746  Resp 18 04/27/23 0746  SpO2 97 % 04/27/23 0746  Vitals shown include unfiled device data.  Last Pain:  Vitals:   04/27/23 0659  TempSrc: Oral  PainSc: 0-No pain         Complications: No notable events documented.

## 2023-04-28 NOTE — Anesthesia Postprocedure Evaluation (Signed)
Anesthesia Post Note  Patient: Edward James  Procedure(s) Performed: CARDIOVERSION  Patient location during evaluation: PACU Anesthesia Type: General Level of consciousness: awake and alert Pain management: pain level controlled Vital Signs Assessment: post-procedure vital signs reviewed and stable Respiratory status: spontaneous breathing, nonlabored ventilation, respiratory function stable and patient connected to nasal cannula oxygen Cardiovascular status: blood pressure returned to baseline and stable Postop Assessment: no apparent nausea or vomiting Anesthetic complications: no   No notable events documented.   Last Vitals:  Vitals:   04/27/23 0815 04/27/23 0830  BP: 124/78 113/74  Pulse: 63 (!) 56  Resp: 16 15  Temp:    SpO2: 96% 97%    Last Pain:  Vitals:   04/27/23 0830  TempSrc:   PainSc: 0-No pain                 Yevette Edwards

## 2023-05-08 ENCOUNTER — Ambulatory Visit: Payer: Medicare PPO | Attending: Cardiology

## 2023-05-08 DIAGNOSIS — I480 Paroxysmal atrial fibrillation: Secondary | ICD-10-CM

## 2023-05-08 LAB — ECHOCARDIOGRAM COMPLETE
AR max vel: 3.65 cm2
AV Area VTI: 3.24 cm2
AV Area mean vel: 3.64 cm2
AV Mean grad: 2 mmHg
AV Peak grad: 4 mmHg
Ao pk vel: 0.99 m/s
Calc EF: 55.3 %
S' Lateral: 3 cm
Single Plane A2C EF: 52.6 %
Single Plane A4C EF: 57.3 %

## 2023-05-11 ENCOUNTER — Other Ambulatory Visit: Payer: Self-pay

## 2023-05-11 DIAGNOSIS — I77819 Aortic ectasia, unspecified site: Secondary | ICD-10-CM

## 2023-05-16 NOTE — Progress Notes (Unsigned)
Cardiology Office Note Date:  05/16/2023  Patient ID:  Edward, James Jun 03, 1941, MRN [id number] PCP:  Dale Clayton, MD  Cardiologist:  Lorine Bears, MD Electrophysiologist: Sherryl Manges, MD    Chief Complaint: DCCV follow-up  History of Present Illness: Edward James is a 82 y.o. male with PMH notable for parox AFib, SVT, PVCs, HTN ; seen today for Sherryl Manges, MD for routine follow-up after recent DCCV.   He last saw Dr. Graciela Husbands 08/2022. His SVT had been well-controlled for many years on 180 dilt, was having episodes but much less symptomatic than previous. Rec to follow-up PRN with EP. He saw PCP 03/23/23, noted to be in AFib w controlled rate of 63 w PVC. He was started on eliquis at that visit. He was set up for DCCV 8/26  On follow-up today, he overall feels well. Denies any overt cardiac symptoms - no palpitations, chest pain, chest pressure. No SOB, edema, syncope. He does have some slight increased fatigued in the summer, but that is typical for him in the hot summers.  He is tolerating eliquis well, no missed doses, no bleeding concerns. He does work in the yard and around the house and has small cuts and abrasions on legs and forearms.  He denies OSA symptoms, has slight snoring.  His wife joins him for today's appointment.   AAD History: none  Past Medical History:  Diagnosis Date   Atrial fibrillation (HCC)    Diverticulosis    Dysrhythmia    GERD (gastroesophageal reflux disease)    Hypercholesterolemia    Hypertension    Prostate cancer [facility])    s/p radical retropubic prostatectomy 1999   Spinal stenosis     Past Surgical History:  Procedure Laterality Date   BIOPSY  05/19/2019   Procedure: BIOPSY;  Surgeon: Carman Ching, MD;  [facility];  Service: Endoscopy;;   CARDIOVERSION N/A 04/27/2023   Procedure: CARDIOVERSION;  Surgeon: Debbe Odea, MD;  Location: [facility];  Service: Cardiovascular;  Laterality: N/A;   COLONOSCOPY WITH  PROPOFOL N/A 05/19/2019   Procedure: COLONOSCOPY WITH PROPOFOL;  Surgeon: Carman Ching, MD;  [facility];  Service: Endoscopy;  Laterality: N/A;   ESOPHAGOGASTRODUODENOSCOPY (EGD) WITH PROPOFOL N/A 05/19/2019   Procedure: ESOPHAGOGASTRODUODENOSCOPY (EGD) WITH PROPOFOL;  Surgeon: Carman Ching, MD;  [facility];  Service: Endoscopy;  Laterality: N/A;   FOLEY CATHETER     INGUINAL HERNIA REPAIR  1945   INGUINAL HERNIA REPAIR Left 12/04/2015   Procedure: HERNIA REPAIR INGUINAL ADULT;  Surgeon: Nadeen Landau, MD;  Location: [facility];  Service: General;  Laterality: Left;   LUMBAR FUSION     L3-4 fusion   pilonidal cyst removal  1969   POLYPECTOMY  05/19/2019   Procedure: POLYPECTOMY;  Surgeon: Carman Ching, MD;  [facility];  Service: Endoscopy;;   RETROPUBIC PROSTATECTOMY     radical (1999)   TONSILLECTOMY AND ADENOIDECTOMY  1948    Current Outpatient Medications  Medication Instructions   Align 4 mg, Oral, Every evening   apixaban (ELIQUIS) 5 mg, Oral, 2 times daily   diltiazem (CARDIZEM CD) 180 mg, Oral, Daily   Fish Oil 1,200 mg, Oral, 2 times daily   lisinopril (ZESTRIL) 20 mg, Oral, Daily   Multiple Vitamins-Minerals (PRESERVISION AREDS PO) 2 tablets, Oral, Daily   pantoprazole (PROTONIX) 40 mg, Oral, Daily   rosuvastatin (CRESTOR) 10 mg, Oral, Daily   triamcinolone (NASACORT) 55 MCG/ACT AERO nasal inhaler 2 sprays, Nasal, Daily at bedtime  Social History:  The patient  reports that he quit smoking about 49 years ago. His smoking use included cigarettes. He has never used smokeless tobacco. He reports that he does not currently use alcohol. He reports that he does not use drugs.   Family History:  The patient's family history includes AAA (abdominal aortic aneurysm) in his father; Arthritis in his mother; Dementia in his mother; Nephrolithiasis in an other family member.  ROS:  Please see the history of present illness. All other systems  are reviewed and otherwise negative.   PHYSICAL EXAM:  VS:  There were no vitals taken for this visit. BMI: There is no height or weight on file to calculate BMI.  GEN- The patient is well appearing, alert and oriented x 3 today.   Lungs- Clear to ausculation bilaterally, normal work of breathing.  Heart- Irregularly irregular rate and rhythm, no murmurs, rubs or gallops Extremities- No peripheral edema, warm, dry   EKG is ordered. Personal review of EKG from today shows:         Recent Labs: 03/20/2023: ALT 20 04/14/2023: BUN 15; Creatinine, Ser 0.92; Hemoglobin 14.8; Magnesium 2.1; Platelets 267; Potassium 4.5; Sodium 141; TSH 1.570  11/14/2022: VLDL 15.2 03/20/2023: Cholesterol 122; HDL 52; LDL Cholesterol (Calc) 54; Total CHOL/HDL Ratio 2.3; Triglycerides 80   CrCl cannot be calculated (Patient's most recent lab result is older than the maximum 21 days allowed.).   Wt Readings from Last 3 Encounters:  04/27/23 235 lb (106.6 kg)  04/14/23 239 lb 9.6 oz (108.7 kg)  03/23/23 237 lb 3.2 oz (107.6 kg)     Additional studies reviewed include: Previous EP, cardiology notes.   Long term monitor, 06/11/2018 Normal sinus rhythm with an average heart rate of 57 bpm. 64 episodes of supraventricular tachycardia were noted.  The longest lasted 5 minutes and 16 seconds with an average rate of 162 bpm. Rare PVCs.  NM myocardial spect, 06/04/2018 Blood pressure demonstrated a hypertensive response to exercise. The study is normal. This is a low risk study. The left ventricular ejection fraction is normal (55-65%). The patient had baseline sinus bradycardia with heart rate of 47 bpm. He developed SVT with exercise with a heart rate around 188 bpm. This resolved with vagal maneuvers and recovery. He also had PVCs with exercise and during recovery. Recommend EP evaluation for SVT.  TTE, 10/18/2015 - Left ventricle: The cavity size was normal. Systolic function was normal. The estimated  ejection fraction was in the range of 55% to 60%. Wall motion was normal; there were no regional wall motion abnormalities. Doppler parameters are consistent with abnormal left ventricular relaxation (grade 1 diastolic dysfunction).  - Aortic valve: There was mild regurgitation.  - Aortic root: The aortic root was mildly dilated.  - Mitral valve: There was mild regurgitation.  - Pulmonary arteries: Systolic pressure was within the normal range.   ASSESSMENT AND PLAN:  #) parox AFib #) SVT No SVT episodes as of late Asymptomatic of afib, controlled ventricular rates Continue 180 dilt   He has not missed any eliquis doses, starting on 7/22. Discussed DCCV, patient is agreeable to proceeding with procedure, will schedule for after 8/19 when patient has been anticoagulated for 4 complete weeks. - preprocedure labs today Will update echo given new afib   #) Hypercoag d/t parox afib CHA2DS2-VASc Score = 3 [CHF History: 0, HTN History: 1, Diabetes History: 0, Stroke History: 0, Vascular Disease History: 0, Age Score: 2, Gender Score: 0].  Therefore, the patient's  annual risk of stroke is 3.2 %.    Stroke ppx - 5mg  eliquis BID, appropriately dosed No bleeding concerns Update CBC    Informed Consent   Shared Decision Making/Informed Consent The risks (stroke, cardiac arrhythmias rarely resulting in the need for a temporary or permanent pacemaker, skin irritation or burns and complications associated with conscious sedation including aspiration, arrhythmia, respiratory failure and death), benefits (restoration of normal sinus rhythm) and alternatives of a direct current cardioversion were explained in detail to Edward James and he agrees to proceed.        Current medicines are reviewed at length with the patient today.   The patient does not have concerns regarding his medicines.  The following changes were made today:  none  Labs/ tests ordered today include:  No orders of the defined  types were placed in this encounter.    Disposition: Follow up with EP APP as usual post procedure   Signed, Sherie Don, NP  05/16/23  8:23 PM  Electrophysiology [facility]

## 2023-05-18 ENCOUNTER — Ambulatory Visit: Payer: Medicare PPO | Attending: Cardiology | Admitting: Cardiology

## 2023-05-18 ENCOUNTER — Encounter: Payer: Self-pay | Admitting: Cardiology

## 2023-05-18 ENCOUNTER — Encounter: Payer: Self-pay | Admitting: Internal Medicine

## 2023-05-18 VITALS — BP 122/78 | HR 87 | Ht 76.0 in | Wt 237.8 lb

## 2023-05-18 DIAGNOSIS — I10 Essential (primary) hypertension: Secondary | ICD-10-CM | POA: Diagnosis not present

## 2023-05-18 DIAGNOSIS — I77819 Aortic ectasia, unspecified site: Secondary | ICD-10-CM | POA: Diagnosis not present

## 2023-05-18 DIAGNOSIS — D6869 Other thrombophilia: Secondary | ICD-10-CM | POA: Diagnosis not present

## 2023-05-18 DIAGNOSIS — I471 Supraventricular tachycardia, unspecified: Secondary | ICD-10-CM | POA: Diagnosis not present

## 2023-05-18 DIAGNOSIS — I480 Paroxysmal atrial fibrillation: Secondary | ICD-10-CM

## 2023-05-18 MED ORDER — AMIODARONE HCL 200 MG PO TABS: ORAL_TABLET | ORAL | 3 refills | Status: DC

## 2023-05-18 NOTE — Patient Instructions (Signed)
Medication Instructions:  Your physician has recommended you make the following change in your medication:  START - amiodarone (PACERONE) 200 MG tablet - Take 1 tablet (200 mg total) by mouth 2 (two) times daily for 14 days, THEN 1 tablet (200 mg total) daily.  *If you need a refill on your cardiac medications before your next appointment, please call your pharmacy*  Lab Work: -None ordered  Testing/Procedures: -None ordered  Follow-Up: At [facility], you and your health needs are our priority.  As part of our continuing mission to provide you with exceptional heart care, we have created designated Provider Care Teams.  These Care Teams include your primary Cardiologist (physician) and Advanced Practice Providers (APPs -  Physician Assistants and Nurse Practitioners) who all work together to provide you with the care you need, when you need it.  Your next appointment:   1 month(s)  Provider:   Sherie Don, NP    Other Instructions -None

## 2023-05-22 DIAGNOSIS — H35371 Puckering of macula, right eye: Secondary | ICD-10-CM | POA: Diagnosis not present

## 2023-05-22 DIAGNOSIS — H43813 Vitreous degeneration, bilateral: Secondary | ICD-10-CM | POA: Diagnosis not present

## 2023-05-22 DIAGNOSIS — Z0100 Encounter for examination of eyes and vision without abnormal findings: Secondary | ICD-10-CM | POA: Diagnosis not present

## 2023-05-22 DIAGNOSIS — H2513 Age-related nuclear cataract, bilateral: Secondary | ICD-10-CM | POA: Diagnosis not present

## 2023-05-22 DIAGNOSIS — H353132 Nonexudative age-related macular degeneration, bilateral, intermediate dry stage: Secondary | ICD-10-CM | POA: Diagnosis not present

## 2023-06-16 NOTE — H&P (View-Only) (Signed)
Cardiology Office Note Date:  06/17/2023  Patient ID:  Edward James 12-07-1940, MRN [id number] PCP:  Dale Achille, MD  Cardiologist:  Lorine Bears, MD Electrophysiologist: Sherryl Manges, MD    Chief Complaint: afib follow-up  History of Present Illness: Edward James is a 82 y.o. male with PMH notable for parox AFib, SVT, PVCs, HTN ; seen today for Sherryl Manges, MD for routine follow-up after recent DCCV.   He last saw Dr. Graciela Husbands 08/2022. His SVT had been well-controlled for many years on 180 dilt, was having episodes but much less symptomatic than previous. Rec to follow-up PRN with EP. He saw PCP 03/23/23, noted to be in AFib w controlled rate of 63 w PVC. He was started on eliquis at that visit. He was set up for DCCV 8/26 and successfully converted to NSR. He held NSR for a week after DCCV, during which time he felt much better while in sinus. I saw him in follow-up 9/16 and started amiodarone load.  On follow-up today, he is not aware of his rhythm or whether he has converted to sinus at any point since our last visit. He does feel better since the weather has turned cooler. His wife joins for visit, who I have met previously. She does not think his energy level has been as good as it was right after his cardioversion.  Brings BP and pulse log. Most BP readings 100-130, pulse 60s-80s.  He has reduced amiodarone to 200mg  daily. Denies GI upset or sun sensitivity on amiodarone.  He continues to diligently take eliquis BID, no bleeding concerns. No missed doses.  He denies palpitations, chest pain, chest pressure, edema.  AAD History: Amiodarone, started 05/2023  Past Medical History:  Diagnosis Date   Atrial fibrillation [facility])    Diverticulosis    Dysrhythmia    GERD (gastroesophageal reflux disease)    Hypercholesterolemia    Hypertension    Prostate cancer [facility])    s/p radical retropubic prostatectomy 1999   Spinal stenosis     Past Surgical History:  Procedure  Laterality Date   BIOPSY  05/19/2019   Procedure: BIOPSY;  Surgeon: Carman Ching, MD;  [facility];  Service: Endoscopy;;   CARDIOVERSION N/A 04/27/2023   Procedure: CARDIOVERSION;  Surgeon: Debbe Odea, MD;  Location: [facility];  Service: Cardiovascular;  Laterality: N/A;   COLONOSCOPY WITH PROPOFOL N/A 05/19/2019   Procedure: COLONOSCOPY WITH PROPOFOL;  Surgeon: Carman Ching, MD;  [facility];  Service: Endoscopy;  Laterality: N/A;   ESOPHAGOGASTRODUODENOSCOPY (EGD) WITH PROPOFOL N/A 05/19/2019   Procedure: ESOPHAGOGASTRODUODENOSCOPY (EGD) WITH PROPOFOL;  Surgeon: Carman Ching, MD;  [facility];  Service: Endoscopy;  Laterality: N/A;   FOLEY CATHETER     INGUINAL HERNIA REPAIR  1945   INGUINAL HERNIA REPAIR Left 12/04/2015   Procedure: HERNIA REPAIR INGUINAL ADULT;  Surgeon: Nadeen Landau, MD;  Location: [facility];  Service: General;  Laterality: Left;   LUMBAR FUSION     L3-4 fusion   pilonidal cyst removal  1969   POLYPECTOMY  05/19/2019   Procedure: POLYPECTOMY;  Surgeon: Carman Ching, MD;  [facility];  Service: Endoscopy;;   RETROPUBIC PROSTATECTOMY     radical (1999)   TONSILLECTOMY AND ADENOIDECTOMY  1948    Current Outpatient Medications  Medication Instructions   Align 4 mg, Oral, Every evening   amiodarone (PACERONE) 200 MG tablet Take 1 tablet (200 mg total) by mouth 2 (two) times daily for 14 days, THEN 1 tablet (200  mg total) daily.   apixaban (ELIQUIS) 5 mg, Oral, 2 times daily   diltiazem (CARDIZEM CD) 180 mg, Oral, Daily   Fish Oil 1,200 mg, Oral, 2 times daily   lisinopril (ZESTRIL) 20 mg, Oral, Daily   Multiple Vitamins-Minerals (PRESERVISION AREDS PO) 2 tablets, Oral, Daily   pantoprazole (PROTONIX) 40 mg, Oral, Daily   rosuvastatin (CRESTOR) 10 mg, Oral, Daily   triamcinolone (NASACORT) 55 MCG/ACT AERO nasal inhaler 2 sprays, Nasal, Daily at bedtime    Social History:  The patient  reports that he quit  smoking about 49 years ago. His smoking use included cigarettes. He has never used smokeless tobacco. He reports that he does not currently use alcohol. He reports that he does not use drugs.   Family History:  The patient's family history includes AAA (abdominal aortic aneurysm) in his father; Arthritis in his mother; Dementia in his mother; Nephrolithiasis in an other family member.  ROS:  Please see the history of present illness. All other systems are reviewed and otherwise negative.   PHYSICAL EXAM:  VS:  BP (!) 140/88 (BP Location: Left Arm, Patient Position: Sitting)   Pulse 66   Ht 6\' 4"  (1.93 m)   Wt 238 lb (108 kg)   SpO2 99%   BMI 28.97 kg/m  BMI: Body mass index is 28.97 kg/m.  GEN- The patient is well appearing, alert and oriented x 3 today.   Lungs- Clear to ausculation bilaterally, normal work of breathing.  Heart- Irregularly irregular rate and rhythm, no murmurs, rubs or gallops Extremities- No peripheral edema, warm, dry   EKG is ordered. Personal review of EKG from today shows:    EKG Interpretation Date/Time:  Wednesday June 17 2023 10:22:45 EDT Ventricular Rate:  66 PR Interval:    QRS Duration:  104 QT Interval:  410 QTC Calculation: 429 R Axis:   -55  Text Interpretation: Atrial fibrillation Pulmonary disease pattern Left anterior fascicular block Confirmed by Sherie Don [phone]) on 06/17/2023 10:30:11 AM    Recent Labs: 03/20/2023: ALT 20 04/14/2023: BUN 15; Creatinine, Ser 0.92; Hemoglobin 14.8; Magnesium 2.1; Platelets 267; Potassium 4.5; Sodium 141; TSH 1.570  11/14/2022: VLDL 15.2 03/20/2023: Cholesterol 122; HDL 52; LDL Cholesterol (Calc) 54; Total CHOL/HDL Ratio 2.3; Triglycerides 80   CrCl cannot be calculated (Patient's most recent lab result is older than the maximum 21 days allowed.).   Wt Readings from Last 3 Encounters:  06/17/23 238 lb (108 kg)  05/18/23 237 lb 12.8 oz (107.9 kg)  04/27/23 235 lb (106.6 kg)     Additional studies  reviewed include: Previous EP, cardiology notes.   TTE, 05/08/2023  1. Left ventricular ejection fraction, by estimation, is 55 to 60%. Left ventricular ejection fraction by 2D MOD biplane is 55.3 %. The left ventricle has normal function. The left ventricle has no regional wall motion abnormalities. There is mild left ventricular hypertrophy. Left ventricular diastolic parameters are indeterminate.   2. Right ventricular systolic function is normal. The right ventricular size is normal.   3. Left atrial size was moderately dilated.   4. Right atrial size was mild to moderately dilated.   5. The mitral valve is normal in structure. Mild mitral valve regurgitation.   6. The aortic valve is tricuspid. Aortic valve regurgitation is mild.   7. Aortic dilatation noted. There is mild dilatation of the aortic root, measuring 40 mm.   8. The inferior vena cava is normal in size with greater than 50% respiratory variability, suggesting right  atrial pressure of 3 mmHg.   Long term monitor, 06/11/2018 Normal sinus rhythm with an average heart rate of 57 bpm. 64 episodes of supraventricular tachycardia were noted.  The longest lasted 5 minutes and 16 seconds with an average rate of 162 bpm. Rare PVCs.  NM myocardial spect, 06/04/2018 Blood pressure demonstrated a hypertensive response to exercise. The study is normal. This is a low risk study. The left ventricular ejection fraction is normal (55-65%). The patient had baseline sinus bradycardia with heart rate of 47 bpm. He developed SVT with exercise with a heart rate around 188 bpm. This resolved with vagal maneuvers and recovery. He also had PVCs with exercise and during recovery. Recommend EP evaluation for SVT.  TTE, 10/18/2015 - Left ventricle: The cavity size was normal. Systolic function was normal. The estimated ejection fraction was in the range of 55% to 60%. Wall motion was normal; there were no regional wall motion abnormalities. Doppler  parameters are consistent with abnormal left ventricular relaxation (grade 1 diastolic dysfunction).  - Aortic valve: There was mild regurgitation.  - Aortic root: The aortic root was mildly dilated.  - Mitral valve: There was mild regurgitation.  - Pulmonary arteries: Systolic pressure was within the normal range.   ASSESSMENT AND PLAN:  #) persis AFib #) SVT #) high-risk medication use - amiodarone S/p successful DCCV 8/26, held sinus rhythm for about a week after DCCV Tolerating amiodarone, continue 200mg  daily Proceed with repeat DCCV Pre-procedure and thyroid labs today  Long discussion with patient regarding reasons to pursue sinus rhythm, consider ablation if able to hold sinus after cardioversion Continue 120mg  dilt daily  #) Hypercoag d/t parox afib CHA2DS2-VASc Score = 3 [CHF History: 0, HTN History: 1, Diabetes History: 0, Stroke History: 0, Vascular Disease History: 0, Age Score: 2, Gender Score: 0].  Therefore, the patient's annual risk of stroke is 3.2 %.    Stroke ppx - 5mg  eliquis BID, appropriately dosed No bleeding concerns, no missed doses    Current medicines are reviewed at length with the patient today.   The patient does not have concerns regarding his medicines.  The following changes were made today:   Continue 200mg  amiodarone  Labs/ tests ordered today include:  Orders Placed This Encounter  Procedures   CBC   Comprehensive metabolic panel   TSH   T4, free   EKG 12-Lead     Disposition: Follow up with EP APP as usual post procedure   Signed, Sherie Don, NP  06/17/23  11:17 AM  Electrophysiology [facility]

## 2023-06-16 NOTE — Progress Notes (Unsigned)
Cardiology Office Note Date:  06/17/2023  Patient ID:  Edward, James 12-07-1940, MRN [id number] PCP:  Dale Achille, MD  Cardiologist:  Lorine Bears, MD Electrophysiologist: Sherryl Manges, MD    Chief Complaint: afib follow-up  History of Present Illness: Edward James is a 82 y.o. male with PMH notable for parox AFib, SVT, PVCs, HTN ; seen today for Sherryl Manges, MD for routine follow-up after recent DCCV.   He last saw Dr. Graciela Husbands 08/2022. His SVT had been well-controlled for many years on 180 dilt, was having episodes but much less symptomatic than previous. Rec to follow-up PRN with EP. He saw PCP 03/23/23, noted to be in AFib w controlled rate of 63 w PVC. He was started on eliquis at that visit. He was set up for DCCV 8/26 and successfully converted to NSR. He held NSR for a week after DCCV, during which time he felt much better while in sinus. I saw him in follow-up 9/16 and started amiodarone load.  On follow-up today, he is not aware of his rhythm or whether he has converted to sinus at any point since our last visit. He does feel better since the weather has turned cooler. His wife joins for visit, who I have met previously. She does not think his energy level has been as good as it was right after his cardioversion.  Brings BP and pulse log. Most BP readings 100-130, pulse 60s-80s.  He has reduced amiodarone to 200mg  daily. Denies GI upset or sun sensitivity on amiodarone.  He continues to diligently take eliquis BID, no bleeding concerns. No missed doses.  He denies palpitations, chest pain, chest pressure, edema.  AAD History: Amiodarone, started 05/2023  Past Medical History:  Diagnosis Date   Atrial fibrillation [facility])    Diverticulosis    Dysrhythmia    GERD (gastroesophageal reflux disease)    Hypercholesterolemia    Hypertension    Prostate cancer [facility])    s/p radical retropubic prostatectomy 1999   Spinal stenosis     Past Surgical History:  Procedure  Laterality Date   BIOPSY  05/19/2019   Procedure: BIOPSY;  Surgeon: Carman Ching, MD;  Location: [facility];  Service: Endoscopy;;   CARDIOVERSION N/A 04/27/2023   Procedure: CARDIOVERSION;  Surgeon: Debbe Odea, MD;  Location: [facility];  Service: Cardiovascular;  Laterality: N/A;   COLONOSCOPY WITH PROPOFOL N/A 05/19/2019   Procedure: COLONOSCOPY WITH PROPOFOL;  Surgeon: Carman Ching, MD;  Location: [facility];  Service: Endoscopy;  Laterality: N/A;   ESOPHAGOGASTRODUODENOSCOPY (EGD) WITH PROPOFOL N/A 05/19/2019   Procedure: ESOPHAGOGASTRODUODENOSCOPY (EGD) WITH PROPOFOL;  Surgeon: Carman Ching, MD;  Location: [facility];  Service: Endoscopy;  Laterality: N/A;   FOLEY CATHETER     INGUINAL HERNIA REPAIR  1945   INGUINAL HERNIA REPAIR Left 12/04/2015   Procedure: HERNIA REPAIR INGUINAL ADULT;  Surgeon: Nadeen Landau, MD;  Location: [facility];  Service: General;  Laterality: Left;   LUMBAR FUSION     L3-4 fusion   pilonidal cyst removal  1969   POLYPECTOMY  05/19/2019   Procedure: POLYPECTOMY;  Surgeon: Carman Ching, MD;  Location: [facility];  Service: Endoscopy;;   RETROPUBIC PROSTATECTOMY     radical (1999)   TONSILLECTOMY AND ADENOIDECTOMY  1948    Current Outpatient Medications  Medication Instructions   Align 4 mg, Oral, Every evening   amiodarone (PACERONE) 200 MG tablet Take 1 tablet (200 mg total) by mouth 2 (two) times daily for 14 days, THEN 1 tablet (200  mg total) daily.   apixaban (ELIQUIS) 5 mg, Oral, 2 times daily   diltiazem (CARDIZEM CD) 180 mg, Oral, Daily   Fish Oil 1,200 mg, Oral, 2 times daily   lisinopril (ZESTRIL) 20 mg, Oral, Daily   Multiple Vitamins-Minerals (PRESERVISION AREDS PO) 2 tablets, Oral, Daily   pantoprazole (PROTONIX) 40 mg, Oral, Daily   rosuvastatin (CRESTOR) 10 mg, Oral, Daily   triamcinolone (NASACORT) 55 MCG/ACT AERO nasal inhaler 2 sprays, Nasal, Daily at bedtime    Social History:  The patient  reports that he quit  smoking about 49 years ago. His smoking use included cigarettes. He has never used smokeless tobacco. He reports that he does not currently use alcohol. He reports that he does not use drugs.   Family History:  The patient's family history includes AAA (abdominal aortic aneurysm) in his father; Arthritis in his mother; Dementia in his mother; Nephrolithiasis in an other family member.  ROS:  Please see the history of present illness. All other systems are reviewed and otherwise negative.   PHYSICAL EXAM:  VS:  BP (!) 140/88 (BP Location: Left Arm, Patient Position: Sitting)   Pulse 66   Ht 6\' 4"  (1.93 m)   Wt 238 lb (108 kg)   SpO2 99%   BMI 28.97 kg/m  BMI: Body mass index is 28.97 kg/m.  GEN- The patient is well appearing, alert and oriented x 3 today.   Lungs- Clear to ausculation bilaterally, normal work of breathing.  Heart- Irregularly irregular rate and rhythm, no murmurs, rubs or gallops Extremities- No peripheral edema, warm, dry   EKG is ordered. Personal review of EKG from today shows:    EKG Interpretation Date/Time:  Wednesday June 17 2023 10:22:45 EDT Ventricular Rate:  66 PR Interval:    QRS Duration:  104 QT Interval:  410 QTC Calculation: 429 R Axis:   -55  Text Interpretation: Atrial fibrillation Pulmonary disease pattern Left anterior fascicular block Confirmed by Sherie Don [phone]) on 06/17/2023 10:30:11 AM    Recent Labs: 03/20/2023: ALT 20 04/14/2023: BUN 15; Creatinine, Ser 0.92; Hemoglobin 14.8; Magnesium 2.1; Platelets 267; Potassium 4.5; Sodium 141; TSH 1.570  11/14/2022: VLDL 15.2 03/20/2023: Cholesterol 122; HDL 52; LDL Cholesterol (Calc) 54; Total CHOL/HDL Ratio 2.3; Triglycerides 80   CrCl cannot be calculated (Patient's most recent lab result is older than the maximum 21 days allowed.).   Wt Readings from Last 3 Encounters:  06/17/23 238 lb (108 kg)  05/18/23 237 lb 12.8 oz (107.9 kg)  04/27/23 235 lb (106.6 kg)     Additional studies  reviewed include: Previous EP, cardiology notes.   TTE, 05/08/2023  1. Left ventricular ejection fraction, by estimation, is 55 to 60%. Left ventricular ejection fraction by 2D MOD biplane is 55.3 %. The left ventricle has normal function. The left ventricle has no regional wall motion abnormalities. There is mild left ventricular hypertrophy. Left ventricular diastolic parameters are indeterminate.   2. Right ventricular systolic function is normal. The right ventricular size is normal.   3. Left atrial size was moderately dilated.   4. Right atrial size was mild to moderately dilated.   5. The mitral valve is normal in structure. Mild mitral valve regurgitation.   6. The aortic valve is tricuspid. Aortic valve regurgitation is mild.   7. Aortic dilatation noted. There is mild dilatation of the aortic root, measuring 40 mm.   8. The inferior vena cava is normal in size with greater than 50% respiratory variability, suggesting right  atrial pressure of 3 mmHg.   Long term monitor, 06/11/2018 Normal sinus rhythm with an average heart rate of 57 bpm. 64 episodes of supraventricular tachycardia were noted.  The longest lasted 5 minutes and 16 seconds with an average rate of 162 bpm. Rare PVCs.  NM myocardial spect, 06/04/2018 Blood pressure demonstrated a hypertensive response to exercise. The study is normal. This is a low risk study. The left ventricular ejection fraction is normal (55-65%). The patient had baseline sinus bradycardia with heart rate of 47 bpm. He developed SVT with exercise with a heart rate around 188 bpm. This resolved with vagal maneuvers and recovery. He also had PVCs with exercise and during recovery. Recommend EP evaluation for SVT.  TTE, 10/18/2015 - Left ventricle: The cavity size was normal. Systolic function was normal. The estimated ejection fraction was in the range of 55% to 60%. Wall motion was normal; there were no regional wall motion abnormalities. Doppler  parameters are consistent with abnormal left ventricular relaxation (grade 1 diastolic dysfunction).  - Aortic valve: There was mild regurgitation.  - Aortic root: The aortic root was mildly dilated.  - Mitral valve: There was mild regurgitation.  - Pulmonary arteries: Systolic pressure was within the normal range.   ASSESSMENT AND PLAN:  #) persis AFib #) SVT #) high-risk medication use - amiodarone S/p successful DCCV 8/26, held sinus rhythm for about a week after DCCV Tolerating amiodarone, continue 200mg  daily Proceed with repeat DCCV Pre-procedure and thyroid labs today  Long discussion with patient regarding reasons to pursue sinus rhythm, consider ablation if able to hold sinus after cardioversion Continue 120mg  dilt daily  #) Hypercoag d/t parox afib CHA2DS2-VASc Score = 3 [CHF History: 0, HTN History: 1, Diabetes History: 0, Stroke History: 0, Vascular Disease History: 0, Age Score: 2, Gender Score: 0].  Therefore, the patient's annual risk of stroke is 3.2 %.    Stroke ppx - 5mg  eliquis BID, appropriately dosed No bleeding concerns, no missed doses    Current medicines are reviewed at length with the patient today.   The patient does not have concerns regarding his medicines.  The following changes were made today:   Continue 200mg  amiodarone  Labs/ tests ordered today include:  Orders Placed This Encounter  Procedures   CBC   Comprehensive metabolic panel   TSH   T4, free   EKG 12-Lead     Disposition: Follow up with EP APP as usual post procedure   Signed, Sherie Don, NP  06/17/23  11:17 AM  Electrophysiology [facility]

## 2023-06-17 ENCOUNTER — Encounter: Payer: Self-pay | Admitting: Cardiology

## 2023-06-17 ENCOUNTER — Ambulatory Visit: Payer: Medicare PPO | Attending: Cardiology | Admitting: Cardiology

## 2023-06-17 VITALS — BP 140/88 | HR 66 | Ht 76.0 in | Wt 238.0 lb

## 2023-06-17 DIAGNOSIS — Z79899 Other long term (current) drug therapy: Secondary | ICD-10-CM

## 2023-06-17 DIAGNOSIS — I10 Essential (primary) hypertension: Secondary | ICD-10-CM

## 2023-06-17 DIAGNOSIS — I4819 Other persistent atrial fibrillation: Secondary | ICD-10-CM

## 2023-06-17 DIAGNOSIS — D6869 Other thrombophilia: Secondary | ICD-10-CM | POA: Diagnosis not present

## 2023-06-17 DIAGNOSIS — I480 Paroxysmal atrial fibrillation: Secondary | ICD-10-CM

## 2023-06-17 DIAGNOSIS — I471 Supraventricular tachycardia, unspecified: Secondary | ICD-10-CM | POA: Diagnosis not present

## 2023-06-17 NOTE — Patient Instructions (Signed)
Medication Instructions:  The current medical regimen is effective;  continue present plan and medications.  *If you need a refill on your cardiac medications before your next appointment, please call your pharmacy*   Lab Work: Your provider would like for you to have following labs drawn today CBC, CMET, TSH, T4.   If you have labs (blood work) drawn today and your tests are completely normal, you will receive your results only by: MyChart Message (if you have MyChart) OR A paper copy in the mail If you have any lab test that is abnormal or we need to change your treatment, we will call you to review the results.   Testing/Procedures: Your physician has requested that you have a Cardioversion. Electrical Cardioversion uses a jolt of electricity to your heart either through paddles or wired patches attached to your chest. This is a controlled, usually prescheduled, procedure. This procedure is done at the hospital and you are not awake during the procedure. You usually go home the day of the procedure. Please see the instruction sheet given to you today for more information.   Follow-Up: At [facility], you and your health needs are our priority.  As part of our continuing mission to provide you with exceptional heart care, we have created designated Provider Care Teams.  These Care Teams include your primary Cardiologist (physician) and Advanced Practice Providers (APPs -  Physician Assistants and Nurse Practitioners) who all work together to provide you with the care you need, when you need it.  We recommend signing up for the patient portal called "MyChart".  Sign up information is provided on this After Visit Summary.  MyChart is used to connect with patients for Virtual Visits (Telemedicine).  Patients are able to view lab/test results, encounter notes, upcoming appointments, etc.  Non-urgent messages can be sent to your provider as well.   To learn more about what you can do  with MyChart, go to [email or web address].    Your next appointment:   2-4 week(s)  Provider:   Sherie Don, NP    Other Instructions     Dear Edward James  You are scheduled for a Cardioversion on Friday, October 25 with Dr. Azucena Cecil.  Please arrive at the [facility] Entrance of [facility], [address], [place] [address] at 6:30 AM (This is 1 hour(s) prior to your procedure time).  Proceed to the Check-In Desk directly inside the entrance.  Procedure Parking: Use the entrance off of the [address] side of the hospital. Turn right upon entering and follow the driveway to parking that is directly in front of the [facility]. There is no valet parking available at this entrance, however there is an awning directly in front of the [facility] for drop off/ pick up for patients.   DIET:  Nothing to eat or drink after midnight except a sip of water with medications (see medication instructions below)  MEDICATION INSTRUCTIONS: !!IF ANY NEW MEDICATIONS ARE STARTED AFTER TODAY, PLEASE NOTIFY YOUR PROVIDER AS SOON AS POSSIBLE!!  FYI: Medications such as Semaglutide (Ozempic, [place]), Tirzepatide (Mounjaro, Zepbound), Dulaglutide (Trulicity), etc ("GLP1 agonists") AND Canagliflozin (Invokana), Dapagliflozin (Farxiga), Empagliflozin (Jardiance), Ertugliflozin (Steglatro), Bexagliflozin [employer]) or any combination with one of these drugs such as Invokamet (Canagliflozin/Metformin), Synjardy (Empagliflozin/Metformin), etc ("SGLT2 inhibitors") must be held around the time of a procedure. This is not a comprehensive list of all of these drugs. Please review all of your medications and talk to your  provider if you take any one of these. If you are not sure, ask your provider.           :1}Continue taking your anticoagulant (blood thinner): Apixaban (Eliquis).  You will need to continue this after your procedure until you are told by your provider that  it is safe to stop.    LABS:   Today- CBC, CMET, TSH, free T4.  FYI:  For your safety, and to allow [place] to monitor your vital signs accurately during the surgery/procedure we request: If you have artificial nails, gel coating, SNS etc, please have those removed prior to your surgery/procedure. Not having the nail coverings /polish removed may result in cancellation or delay of your surgery/procedure.  You must have a responsible person to drive you home and stay in the waiting area during your procedure. Failure to do so could result in cancellation.  Bring your insurance cards.  *Special Note: Every effort is made to have your procedure done on time. Occasionally there are emergencies that occur at the hospital that may cause delays. Please be patient if a delay does occur.

## 2023-06-18 LAB — CBC
Hematocrit: 45.9 % (ref 37.5–51.0)
Hemoglobin: 14.8 g/dL (ref 13.0–17.7)
MCH: 29.2 pg (ref 26.6–33.0)
MCHC: 32.2 g/dL (ref 31.5–35.7)
MCV: 91 fL (ref 79–97)
Platelets: 243 10*3/uL (ref 150–450)
RBC: 5.06 x10E6/uL (ref 4.14–5.80)
RDW: 12.6 % (ref 11.6–15.4)
WBC: 6.7 10*3/uL (ref 3.4–10.8)

## 2023-06-18 LAB — COMPREHENSIVE METABOLIC PANEL
ALT: 23 [IU]/L (ref 0–44)
AST: 16 [IU]/L (ref 0–40)
Albumin: 4.3 g/dL (ref 3.8–4.8)
Alkaline Phosphatase: 69 [IU]/L (ref 44–121)
BUN/Creatinine Ratio: 17 (ref 10–24)
BUN: 17 mg/dL (ref 8–27)
Bilirubin Total: 0.4 mg/dL (ref 0.0–1.2)
CO2: 25 mmol/L (ref 20–29)
Calcium: 9.4 mg/dL (ref 8.6–10.2)
Chloride: 104 mmol/L (ref 96–106)
Creatinine, Ser: 1 mg/dL (ref 0.76–1.27)
Globulin, Total: 2 g/dL (ref 1.5–4.5)
Glucose: 73 mg/dL (ref 70–99)
Potassium: 4.2 mmol/L (ref 3.5–5.2)
Sodium: 141 mmol/L (ref 134–144)
Total Protein: 6.3 g/dL (ref 6.0–8.5)
eGFR: 76 mL/min/{1.73_m2} (ref >59)

## 2023-06-18 LAB — TSH: TSH: 3.78 u[IU]/mL (ref 0.450–4.500)

## 2023-06-18 LAB — T4, FREE: Free T4: 1.45 ng/dL (ref 0.82–1.77)

## 2023-06-19 ENCOUNTER — Encounter: Payer: Self-pay | Admitting: Internal Medicine

## 2023-06-23 NOTE — Telephone Encounter (Signed)
I do not need to recheck the labs that she is checking, but do need to check lipid , psa and A1c.

## 2023-06-23 NOTE — Addendum Note (Signed)
Addended by: Rita Ohara D on: 06/23/2023 03:06 PM   Modules accepted: Orders

## 2023-06-25 MED ORDER — SODIUM CHLORIDE 0.9% FLUSH: 10.0000 mL | Freq: Two times a day (BID) | INTRAVENOUS | Status: DC

## 2023-06-26 ENCOUNTER — Ambulatory Visit
Admission: RE | Admit: 2023-06-26 | Discharge: 2023-06-26 | Disposition: A | Discharge status: Home / Self Care | Payer: Medicare PPO | Attending: Cardiology | Admitting: Cardiology

## 2023-06-26 ENCOUNTER — Other Ambulatory Visit: Payer: Self-pay

## 2023-06-26 ENCOUNTER — Encounter: Payer: Self-pay | Admitting: Cardiology

## 2023-06-26 ENCOUNTER — Ambulatory Visit: Payer: Medicare PPO | Admitting: General Practice

## 2023-06-26 ENCOUNTER — Encounter
Admission: RE | Disposition: A | Discharge status: Home / Self Care | Payer: Self-pay | Source: Home / Self Care | Attending: Cardiology

## 2023-06-26 DIAGNOSIS — I480 Paroxysmal atrial fibrillation: Secondary | ICD-10-CM | POA: Insufficient documentation

## 2023-06-26 DIAGNOSIS — I493 Ventricular premature depolarization: Secondary | ICD-10-CM | POA: Diagnosis not present

## 2023-06-26 DIAGNOSIS — E7800 Pure hypercholesterolemia, unspecified: Secondary | ICD-10-CM | POA: Insufficient documentation

## 2023-06-26 DIAGNOSIS — Z79899 Other long term (current) drug therapy: Secondary | ICD-10-CM | POA: Diagnosis not present

## 2023-06-26 DIAGNOSIS — Z8546 Personal history of malignant neoplasm of prostate: Secondary | ICD-10-CM | POA: Insufficient documentation

## 2023-06-26 DIAGNOSIS — I4819 Other persistent atrial fibrillation: Secondary | ICD-10-CM

## 2023-06-26 DIAGNOSIS — Z87891 Personal history of nicotine dependence: Secondary | ICD-10-CM | POA: Insufficient documentation

## 2023-06-26 DIAGNOSIS — I444 Left anterior fascicular block: Secondary | ICD-10-CM | POA: Diagnosis not present

## 2023-06-26 DIAGNOSIS — I10 Essential (primary) hypertension: Secondary | ICD-10-CM | POA: Diagnosis not present

## 2023-06-26 DIAGNOSIS — I471 Supraventricular tachycardia, unspecified: Secondary | ICD-10-CM | POA: Diagnosis not present

## 2023-06-26 DIAGNOSIS — K219 Gastro-esophageal reflux disease without esophagitis: Secondary | ICD-10-CM | POA: Insufficient documentation

## 2023-06-26 DIAGNOSIS — I4891 Unspecified atrial fibrillation: Secondary | ICD-10-CM | POA: Diagnosis not present

## 2023-06-26 DIAGNOSIS — Z7901 Long term (current) use of anticoagulants: Secondary | ICD-10-CM | POA: Insufficient documentation

## 2023-06-26 HISTORY — PX: CARDIOVERSION: SHX1299

## 2023-06-26 SURGERY — CARDIOVERSION
Anesthesia: General

## 2023-06-26 MED ORDER — PROPOFOL 10 MG/ML IV BOLUS
INTRAVENOUS | Status: AC
  Filled 2023-06-26: qty 20

## 2023-06-26 MED ORDER — OXYCODONE HCL 5 MG/5ML PO SOLN: 5.0000 mg | Freq: Once | ORAL | Status: DC | PRN

## 2023-06-26 MED ORDER — OXYCODONE HCL 5 MG PO TABS: 5.0000 mg | ORAL_TABLET | Freq: Once | ORAL | Status: DC | PRN

## 2023-06-26 MED ORDER — FENTANYL CITRATE (PF) 100 MCG/2ML IJ SOLN: 25.0000 ug | INTRAMUSCULAR | Status: DC | PRN

## 2023-06-26 MED ORDER — PROPOFOL 10 MG/ML IV BOLUS
INTRAVENOUS | Status: DC | PRN
  Administered 2023-06-26: 10 mg via INTRAVENOUS
  Administered 2023-06-26: 50 mg via INTRAVENOUS

## 2023-06-26 NOTE — Anesthesia Postprocedure Evaluation (Signed)
Anesthesia Post Note  Patient: Edward James  Procedure(s) Performed: CARDIOVERSION  Patient location during evaluation: PACU Anesthesia Type: General Level of consciousness: awake and alert Pain management: pain level controlled Vital Signs Assessment: post-procedure vital signs reviewed and stable Respiratory status: spontaneous breathing, nonlabored ventilation, respiratory function stable and patient connected to nasal cannula oxygen Cardiovascular status: blood pressure returned to baseline and stable Postop Assessment: no apparent nausea or vomiting Anesthetic complications: no  No notable events documented.   Last Vitals:  Vitals:   06/26/23 0800 06/26/23 0815  BP: 118/71 116/68  Pulse: (!) 56 (!) 52  Resp: 13 16  Temp:    SpO2: 97% 96%    Last Pain:  Vitals:   06/26/23 0815  TempSrc:   PainSc: 0-No pain                 Stephanie Coup

## 2023-06-26 NOTE — Interval H&P Note (Signed)
History and Physical Interval Note:  06/26/2023 7:48 AM  Edward James  has presented today for surgery, with the diagnosis of presistent Afib.  The various methods of treatment have been discussed with the patient and family. After consideration of risks, benefits and other options for treatment, the patient has consented to  Procedure(s): CARDIOVERSION (N/A) as a surgical intervention.  The patient's history has been reviewed, patient examined, no change in status, stable for surgery.  I have reviewed the patient's chart and labs.  Questions were answered to the patient's satisfaction.     Arlys John Agbor-Etang

## 2023-06-26 NOTE — Anesthesia Preprocedure Evaluation (Signed)
Anesthesia Evaluation  Patient identified by MRN, date of birth, ID band Patient awake    Reviewed: Allergy & Precautions, H&P , NPO status , Patient's Chart, lab work & pertinent test results, reviewed documented beta blocker date and time   Airway Mallampati: II   Neck ROM: full    Dental  (+) Poor Dentition, Dental Advidsory Given   Pulmonary neg pulmonary ROS, former smoker   Pulmonary exam normal        Cardiovascular Exercise Tolerance: Poor hypertension, On Medications Normal cardiovascular exam+ dysrhythmias  Rhythm:regular Rate:Normal     Neuro/Psych negative neurological ROS  negative psych ROS   GI/Hepatic Neg liver ROS,GERD  Medicated,,  Endo/Other  negative endocrine ROS    Renal/GU      Musculoskeletal   Abdominal   Peds  Hematology negative hematology ROS (+)   Anesthesia Other Findings Past Medical History: No date: Atrial fibrillation (HCC) No date: Diverticulosis No date: Dysrhythmia No date: GERD (gastroesophageal reflux disease) No date: Hypercholesterolemia No date: Hypertension No date: Prostate cancer (HCC)     Comment:  s/p radical retropubic prostatectomy 1999 No date: Spinal stenosis Past Surgical History: 05/19/2019: BIOPSY     Comment:  Procedure: BIOPSY;  Surgeon: Carman Ching, MD;                Location: [facility];  Service: Endoscopy;; 05/19/2019: COLONOSCOPY WITH PROPOFOL; N/A     Comment:  Procedure: COLONOSCOPY WITH PROPOFOL;  Surgeon: Carman Ching, MD;  Location: [facility];  Service: Endoscopy;               Laterality: N/A; 05/19/2019: ESOPHAGOGASTRODUODENOSCOPY (EGD) WITH PROPOFOL; N/A     Comment:  Procedure: ESOPHAGOGASTRODUODENOSCOPY (EGD) WITH               PROPOFOL;  Surgeon: Carman Ching, MD;  [facility]: [facility];  Service: Endoscopy;  Laterality: N/A; No date: FOLEY CATHETER 1945: INGUINAL HERNIA REPAIR 12/04/2015:  INGUINAL HERNIA REPAIR; Left     Comment:  Procedure: HERNIA REPAIR INGUINAL ADULT;  Surgeon:               Nadeen Landau, MD;  Location: [facility];  Service:               General;  Laterality: Left; No date: LUMBAR FUSION     Comment:  L3-4 fusion 1969: pilonidal cyst removal 05/19/2019: POLYPECTOMY     Comment:  Procedure: POLYPECTOMY;  Surgeon: Carman Ching, MD;                Location: [facility];  Service: Endoscopy;; No date: RETROPUBIC PROSTATECTOMY     Comment:  radical (1999) 1948: TONSILLECTOMY AND ADENOIDECTOMY BMI    Body Mass Index: 28.61 kg/m     Reproductive/Obstetrics negative OB ROS                             Anesthesia Physical Anesthesia Plan  ASA: 3  Anesthesia Plan: General   Post-op Pain Management: Minimal or no pain anticipated   Induction: Intravenous  PONV Risk Score and Plan: 3 and Propofol infusion, TIVA and Ondansetron  Airway Management Planned: Nasal Cannula  Additional Equipment: None  Intra-op Plan:   Post-operative Plan:   Informed Consent: I have reviewed the patients History and Physical, chart,  labs and discussed the procedure including the risks, benefits and alternatives for the proposed anesthesia with the patient or authorized representative who has indicated his/her understanding and acceptance.     Dental advisory given  Plan Discussed with: CRNA and Surgeon  Anesthesia Plan Comments: (Discussed risks of anesthesia with patient, including possibility of difficulty with spontaneous ventilation under anesthesia necessitating airway intervention, PONV, and rare risks such as cardiac or respiratory or neurological events, and allergic reactions. Discussed the role of CRNA in patient's perioperative care. Patient understands.)       Anesthesia Quick Evaluation

## 2023-06-26 NOTE — Transfer of Care (Signed)
Immediate Anesthesia Transfer of Care Note  Patient: Edward James  Procedure(s) Performed: CARDIOVERSION  Patient Location: Short Stay  Anesthesia Type:General  Level of Consciousness: drowsy  Airway & Oxygen Therapy: Patient Spontanous Breathing and Patient connected to nasal cannula oxygen  Post-op Assessment: Report given to RN and Post -op Vital signs reviewed and stable  Post vital signs: Reviewed and stable  Last Vitals:  Vitals Value Taken Time  BP 117/77 06/26/23 0745  Temp    Pulse 67 06/26/23 0747  Resp 15 06/26/23 0747  SpO2 97 % 06/26/23 0747    Last Pain:  Vitals:   06/26/23 0654  TempSrc: Oral  PainSc: 0-No pain         Complications: No notable events documented.

## 2023-06-26 NOTE — Procedures (Signed)
Cardioversion procedure note For atrial fibrillation.  Procedure Details:  Consent: Risks of procedure as well as the alternatives and risks of each were explained to the (patient/caregiver).  Consent for procedure obtained.  Time Out: Verified patient identification, verified procedure, site/side was marked, verified correct patient position, special equipment/implants available, medications/allergies/relevent history reviewed, required imaging and test results available.  Performed  Patient placed on cardiac monitor, pulse oximetry, supplemental oxygen as necessary.   Sedation given: propofol IV, per anesthesia team Pacer pads placed anterior and posterior chest.   Cardioverted 1 time(s).   Cardioverted at  200J. Synchronized biphasic Converted to NSR   Evaluation: Findings: Post procedure EKG shows: NSR with pac's Complications: None Patient did tolerate procedure well.  Time Spent Directly with the Patient:  25 minutes   Debbe Odea, M.D.

## 2023-06-26 NOTE — Anesthesia Procedure Notes (Signed)
Procedure Name: MAC Date/Time: 06/26/2023 7:24 AM  Performed by: Hezzie Bump, CRNAPre-anesthesia Checklist: Patient identified, Emergency Drugs available, Suction available and Patient being monitored Patient Re-evaluated:Patient Re-evaluated prior to induction Oxygen Delivery Method: Nasal cannula Induction Type: IV induction Placement Confirmation: positive ETCO2

## 2023-07-12 NOTE — Progress Notes (Unsigned)
Cardiology Office Note Date:  07/13/2023  Patient ID:  Edward James, Edward James May 19, 1941, MRN [id number] PCP:  Dale Dorchester, MD  Cardiologist:  Lorine Bears, MD Electrophysiologist: Sherryl Manges, MD    Chief Complaint: afib follow-up  History of Present Illness: Edward James is a 82 y.o. male with PMH notable for parox AFib, SVT, PVCs, HTN ; seen today for Sherryl Manges, MD for routine follow-up after recent DCCV.   He last saw Dr. Graciela Husbands 08/2022. His SVT had been well-controlled for many years on 180 dilt, was having episodes but much less symptomatic than previous. Rec to follow-up PRN with EP. He saw PCP 03/23/23, noted to be in AFib w controlled rate of 63 w PVC. He was started on eliquis at that visit. He was set up for DCCV 8/26 and successfully converted to NSR. He held NSR for a week after DCCV, during which time he felt much better while in sinus. I saw him in follow-up 9/16 and started amiodarone load. He is now s/p successful DCCV 10/25  On follow-up today, he felt really good after the DCCV for about 3-5 days, had more energy. He continues to take amiodarone 200mg  daily, no GI upset or sun sensitivity.   He continues to diligently take eliquis BID, no bleeding concerns. No missed doses.  He denies palpitations, chest pain, chest pressure, edema.  AAD History: Amiodarone, started 05/2023  Past Medical History:  Diagnosis Date   Atrial fibrillation [facility]    Diverticulosis    Dysrhythmia    GERD (gastroesophageal reflux disease)    Hypercholesterolemia    Hypertension    Prostate cancer [facility])    s/p radical retropubic prostatectomy 1999   Spinal stenosis     Past Surgical History:  Procedure Laterality Date   BIOPSY  05/19/2019   Procedure: BIOPSY;  Surgeon: Carman Ching, MD;  [facility]: [facility];  Service: Endoscopy;;   CARDIOVERSION N/A 04/27/2023   Procedure: CARDIOVERSION;  Surgeon: Debbe Odea, MD;  Location: [facility];  Service: Cardiovascular;   Laterality: N/A;   CARDIOVERSION N/A 06/26/2023   Procedure: CARDIOVERSION;  Surgeon: Debbe Odea, MD;  Location: [facility];  Service: Cardiovascular;  Laterality: N/A;   COLONOSCOPY WITH PROPOFOL N/A 05/19/2019   Procedure: COLONOSCOPY WITH PROPOFOL;  Surgeon: Carman Ching, MD;  [facility];  Service: Endoscopy;  Laterality: N/A;   ESOPHAGOGASTRODUODENOSCOPY (EGD) WITH PROPOFOL N/A 05/19/2019   Procedure: ESOPHAGOGASTRODUODENOSCOPY (EGD) WITH PROPOFOL;  Surgeon: Carman Ching, MD;  [facility];  Service: Endoscopy;  Laterality: N/A;   FOLEY CATHETER     INGUINAL HERNIA REPAIR  1945   INGUINAL HERNIA REPAIR Left 12/04/2015   Procedure: HERNIA REPAIR INGUINAL ADULT;  Surgeon: Nadeen Landau, MD;  Location: [facility];  Service: General;  Laterality: Left;   LUMBAR FUSION     L3-4 fusion   pilonidal cyst removal  1969   POLYPECTOMY  05/19/2019   Procedure: POLYPECTOMY;  Surgeon: Carman Ching, MD;  [facility];  Service: Endoscopy;;   RETROPUBIC PROSTATECTOMY     radical (1999)   TONSILLECTOMY AND ADENOIDECTOMY  1948    Current Outpatient Medications  Medication Instructions   Align 4 mg, Oral, Every evening   amiodarone (PACERONE) 200 MG tablet Take 1 tablet (200 mg total) by mouth 2 (two) times daily for 14 days, THEN 1 tablet (200 mg total) daily.   apixaban (ELIQUIS) 5 mg, Oral, 2 times daily   diltiazem (CARDIZEM CD) 180 mg, Oral, Daily   Fish Oil 1,200 mg,  Oral, 2 times daily   lisinopril (ZESTRIL) 20 mg, Oral, Daily   Multiple Vitamins-Minerals (PRESERVISION AREDS PO) 2 tablets, Oral, Daily   pantoprazole (PROTONIX) 40 mg, Oral, Daily   rosuvastatin (CRESTOR) 10 mg, Oral, Daily   triamcinolone (NASACORT) 55 MCG/ACT AERO nasal inhaler 2 sprays, Nasal, Daily at bedtime    Social History:  The patient  reports that he quit smoking about 49 years ago. His smoking use included cigarettes. He has never used smokeless tobacco. He reports that  he does not currently use alcohol. He reports that he does not use drugs.   Family History:  The patient's family history includes AAA (abdominal aortic aneurysm) in his father; Arthritis in his mother; Dementia in his mother; Nephrolithiasis in an other family member.  ROS:  Please see the history of present illness. All other systems are reviewed and otherwise negative.   PHYSICAL EXAM:  VS:  BP 130/80 (BP Location: Left Arm, Patient Position: Sitting, Cuff Size: Normal)   Pulse 71   Ht 6\' 4"  (1.93 m)   Wt 236 lb 3.2 oz (107.1 kg)   SpO2 98%   BMI 28.75 kg/m  BMI: Body mass index is 28.75 kg/m.  GEN- The patient is well appearing, alert and oriented x 3 today.   Lungs- Clear to ausculation bilaterally, normal work of breathing.  Heart- Irregularly irregular rate and rhythm, no murmurs, rubs or gallops Extremities- No peripheral edema, warm, dry   EKG is ordered. Personal review of EKG from today shows:    EKG Interpretation Date/Time:  Monday July 13 2023 10:31:32 EST Ventricular Rate:  71 PR Interval:    QRS Duration:  108 QT Interval:  472 QTC Calculation: 512 R Axis:   -60  Text Interpretation: typical counterclockwise Atrial flutter with variable A-V block Left anterior fascicular block Confirmed by Sherie Don [phone]) on 07/13/2023 12:04:22 PM    Recent Labs: 04/14/2023: Magnesium 2.1 06/17/2023: ALT 23; BUN 17; Creatinine, Ser 1.00; Hemoglobin 14.8; Platelets 243; Potassium 4.2; Sodium 141; TSH 3.780  11/14/2022: VLDL 15.2 03/20/2023: Cholesterol 122; HDL 52; LDL Cholesterol (Calc) 54; Total CHOL/HDL Ratio 2.3; Triglycerides 80   CrCl cannot be calculated (Patient's most recent lab result is older than the maximum 21 days allowed.).   Wt Readings from Last 3 Encounters:  07/13/23 236 lb 3.2 oz (107.1 kg)  06/26/23 233 lb (105.7 kg)  06/17/23 238 lb (108 kg)     Additional studies reviewed include: Previous EP, cardiology notes.   TTE, 05/08/2023  1. Left  ventricular ejection fraction, by estimation, is 55 to 60%. Left ventricular ejection fraction by 2D MOD biplane is 55.3 %. The left ventricle has normal function. The left ventricle has no regional wall motion abnormalities. There is mild left ventricular hypertrophy. Left ventricular diastolic parameters are indeterminate.   2. Right ventricular systolic function is normal. The right ventricular size is normal.   3. Left atrial size was moderately dilated.   4. Right atrial size was mild to moderately dilated.   5. The mitral valve is normal in structure. Mild mitral valve regurgitation.   6. The aortic valve is tricuspid. Aortic valve regurgitation is mild.   7. Aortic dilatation noted. There is mild dilatation of the aortic root, measuring 40 mm.   8. The inferior vena cava is normal in size with greater than 50% respiratory variability, suggesting right atrial pressure of 3 mmHg.   Long term monitor, 06/11/2018 Normal sinus rhythm with an average heart rate of 57  bpm. 64 episodes of supraventricular tachycardia were noted.  The longest lasted 5 minutes and 16 seconds with an average rate of 162 bpm. Rare PVCs.  NM myocardial spect, 06/04/2018 Blood pressure demonstrated a hypertensive response to exercise. The study is normal. This is a low risk study. The left ventricular ejection fraction is normal (55-65%). The patient had baseline sinus bradycardia with heart rate of 47 bpm. He developed SVT with exercise with a heart rate around 188 bpm. This resolved with vagal maneuvers and recovery. He also had PVCs with exercise and during recovery. Recommend EP evaluation for SVT.  TTE, 10/18/2015 - Left ventricle: The cavity size was normal. Systolic function was normal. The estimated ejection fraction was in the range of 55% to 60%. Wall motion was normal; there were no regional wall motion abnormalities. Doppler parameters are consistent with abnormal left ventricular relaxation (grade 1  diastolic dysfunction).  - Aortic valve: There was mild regurgitation.  - Aortic root: The aortic root was mildly dilated.  - Mitral valve: There was mild regurgitation.  - Pulmonary arteries: Systolic pressure was within the normal range.   ASSESSMENT AND PLAN:  #) persis AFib #) SVT #) high-risk medication use - amiodarone S/p successful DCCV 8/26, held sinus rhythm for about a week after DCCV Loaded on amiodarone and s/p successful DCCV 10/25 and has now converted to typical aflutter Discussed with Dr Graciela Husbands, who recommended to increase amiodarone to 200mg  BID x 2 weeks, then return to 200mg  daily Plan to repeat DCCV in about a 1 month if continues to be in flutter Consider aflutter ablation  Continue 120mg  dilt daily  #) Hypercoag d/t parox afib CHA2DS2-VASc Score = 3 [CHF History: 0, HTN History: 1, Diabetes History: 0, Stroke History: 0, Vascular Disease History: 0, Age Score: 2, Gender Score: 0].  Therefore, the patient's annual risk of stroke is 3.2 %.    Stroke ppx - 5mg  eliquis BID, appropriately dosed No bleeding concerns, no missed doses    Current medicines are reviewed at length with the patient today.   The patient does not have concerns regarding his medicines.  The following changes were made today:   INCREASE amiodarone to 200mg  BID x 2 weeks Then reduce to 200mg  daily   Labs/ tests ordered today include:  Orders Placed This Encounter  Procedures   Hepatic function panel   T4   TSH   EKG 12-Lead     Disposition: Follow up with Dr. Graciela Husbands or EP APP in 4 weeks   Signed, Sherie Don, NP  07/13/23  12:04 PM  Electrophysiology [facility]

## 2023-07-13 ENCOUNTER — Telehealth: Payer: Self-pay | Admitting: Internal Medicine

## 2023-07-13 ENCOUNTER — Ambulatory Visit: Payer: Medicare PPO | Attending: Cardiology | Admitting: Cardiology

## 2023-07-13 ENCOUNTER — Encounter: Payer: Self-pay | Admitting: Cardiology

## 2023-07-13 VITALS — BP 130/80 | HR 71 | Ht 76.0 in | Wt 236.2 lb

## 2023-07-13 DIAGNOSIS — I480 Paroxysmal atrial fibrillation: Secondary | ICD-10-CM

## 2023-07-13 DIAGNOSIS — I4819 Other persistent atrial fibrillation: Secondary | ICD-10-CM | POA: Diagnosis not present

## 2023-07-13 DIAGNOSIS — I4891 Unspecified atrial fibrillation: Secondary | ICD-10-CM

## 2023-07-13 DIAGNOSIS — D6869 Other thrombophilia: Secondary | ICD-10-CM

## 2023-07-13 DIAGNOSIS — E7800 Pure hypercholesterolemia, unspecified: Secondary | ICD-10-CM

## 2023-07-13 DIAGNOSIS — Z79899 Other long term (current) drug therapy: Secondary | ICD-10-CM | POA: Diagnosis not present

## 2023-07-13 MED ORDER — AMIODARONE HCL 200 MG PO TABS: ORAL_TABLET | ORAL | 0 refills | Status: DC

## 2023-07-13 NOTE — Telephone Encounter (Signed)
Labs ordered and added [employer] as a CC provider.

## 2023-07-13 NOTE — Telephone Encounter (Signed)
Patient's wife walked in and need lab orders sent to heart care. The labs are in the colorful folder. She just wants to make sure they have the orders in. Her number is [phone].

## 2023-07-13 NOTE — Patient Instructions (Signed)
Medication Instructions:  Your physician has recommended you make the following change in your medication:   Increase- Amiodarone 200 mg tablet by mouth twice daily for 2 weeks,  then Take Amiodarone 200 mg by mouth once daily.  *If you need a refill on your cardiac medications before your next appointment, please call your pharmacy*   Lab Work:  Your physician recommends that you have additional lab work drawn at your primary care office: Next week.  Hepatic TSH T4  If you have labs (blood work) drawn today and your tests are completely normal, you will receive your results only by: MyChart Message (if you have MyChart) OR A paper copy in the mail If you have any lab test that is abnormal or we need to change your treatment, we will call you to review the results.   Testing/Procedures:  NONE   Follow-Up: At [facility], you and your health needs are our priority.  As part of our continuing mission to provide you with exceptional heart care, we have created designated Provider Care Teams.  These Care Teams include your primary Cardiologist (physician) and Advanced Practice Providers (APPs -  Physician Assistants and Nurse Practitioners) who all work together to provide you with the care you need, when you need it.  We recommend signing up for the patient portal called "MyChart".  Sign up information is provided on this After Visit Summary.  MyChart is used to connect with patients for Virtual Visits (Telemedicine).  Patients are able to view lab/test results, encounter notes, upcoming appointments, etc.  Non-urgent messages can be sent to your provider as well.   To learn more about what you can do with MyChart, go to [email or web address].    Your next appointment:   1 month(s)  Provider:   Sherie Don, NP

## 2023-07-14 DIAGNOSIS — H2511 Age-related nuclear cataract, right eye: Secondary | ICD-10-CM | POA: Diagnosis not present

## 2023-07-20 ENCOUNTER — Telehealth: Payer: Self-pay | Admitting: Internal Medicine

## 2023-07-20 ENCOUNTER — Other Ambulatory Visit (INDEPENDENT_AMBULATORY_CARE_PROVIDER_SITE_OTHER): Payer: Medicare PPO

## 2023-07-20 DIAGNOSIS — I480 Paroxysmal atrial fibrillation: Secondary | ICD-10-CM

## 2023-07-20 DIAGNOSIS — R739 Hyperglycemia, unspecified: Secondary | ICD-10-CM

## 2023-07-20 DIAGNOSIS — Z8546 Personal history of malignant neoplasm of prostate: Secondary | ICD-10-CM | POA: Diagnosis not present

## 2023-07-20 DIAGNOSIS — E7800 Pure hypercholesterolemia, unspecified: Secondary | ICD-10-CM | POA: Diagnosis not present

## 2023-07-20 DIAGNOSIS — I4891 Unspecified atrial fibrillation: Secondary | ICD-10-CM | POA: Diagnosis not present

## 2023-07-20 DIAGNOSIS — D6869 Other thrombophilia: Secondary | ICD-10-CM

## 2023-07-20 LAB — PSA, MEDICARE: PSA: 0.02 ng/mL — ABNORMAL LOW (ref 0.10–4.00)

## 2023-07-20 LAB — BASIC METABOLIC PANEL
BUN: 17 mg/dL (ref 6–23)
CO2: 28 meq/L (ref 19–32)
Calcium: 9.3 mg/dL (ref 8.4–10.5)
Chloride: 105 meq/L (ref 96–112)
Creatinine, Ser: 0.96 mg/dL (ref 0.40–1.50)
GFR: 74.59 mL/min (ref >60.00)
Glucose, Bld: 95 mg/dL (ref 70–99)
Potassium: 4.3 meq/L (ref 3.5–5.1)
Sodium: 141 meq/L (ref 135–145)

## 2023-07-20 LAB — HEPATIC FUNCTION PANEL
ALT: 22 U/L (ref 0–53)
AST: 16 U/L (ref 0–37)
Albumin: 4.4 g/dL (ref 3.5–5.2)
Alkaline Phosphatase: 64 U/L (ref 39–117)
Bilirubin, Direct: 0.2 mg/dL (ref 0.0–0.3)
Total Bilirubin: 0.8 mg/dL (ref 0.2–1.2)
Total Protein: 6.5 g/dL (ref 6.0–8.3)

## 2023-07-20 LAB — LIPID PANEL
Cholesterol: 119 mg/dL (ref 0–200)
HDL: 47 mg/dL (ref >39.00)
LDL Cholesterol: 60 mg/dL (ref 0–99)
NonHDL: 72.08
Total CHOL/HDL Ratio: 3
Triglycerides: 59 mg/dL (ref 0.0–149.0)
VLDL: 11.8 mg/dL (ref 0.0–40.0)

## 2023-07-20 LAB — TSH: TSH: 4.19 u[IU]/mL (ref 0.35–5.50)

## 2023-07-20 LAB — HEMOGLOBIN A1C: Hgb A1c MFr Bld: 5.8 % (ref 4.6–6.5)

## 2023-07-20 NOTE — Telephone Encounter (Signed)
*  STAT* If patient is at the pharmacy, call can be transferred to refill team.   1. Which medications need to be refilled? (please list name of each medication and dose if known) amiodarone (PACERONE) 200 MG tablet   2. Which pharmacy/location (including street and city if local pharmacy) is medication to be sent to?  [facility] - [place], [place] - [address]    3. Do they need a 30 day or 90 day supply? 90

## 2023-07-20 NOTE — Telephone Encounter (Signed)
I spoke with pt and he was made aware that we sent in new Rx 07/13/2023 with new instructions and that he should have enough tablets for the remainder of this month. Pt mentioned that he is still taking tablet 1 tablet bid as directed has one week left and then he will be down to 1 tablet daily. Pt mentioned that they are going to check with Pharmacy and make sure that they picked up the correct prescription and that it may be an error from the pharmacy not realizing they had a pending rx for amiodarone. They will contact us if they need a new Rx.

## 2023-07-21 ENCOUNTER — Encounter: Payer: Self-pay | Admitting: Ophthalmology

## 2023-07-21 LAB — T4: T4, Total: 8.6 ug/dL (ref 4.9–10.5)

## 2023-07-22 ENCOUNTER — Ambulatory Visit: Payer: Medicare PPO | Admitting: Internal Medicine

## 2023-07-22 VITALS — BP 128/76 | HR 90 | Temp 98.0°F | Resp 16 | Ht 76.0 in | Wt 239.0 lb

## 2023-07-22 DIAGNOSIS — E7800 Pure hypercholesterolemia, unspecified: Secondary | ICD-10-CM | POA: Diagnosis not present

## 2023-07-22 DIAGNOSIS — I480 Paroxysmal atrial fibrillation: Secondary | ICD-10-CM | POA: Diagnosis not present

## 2023-07-22 DIAGNOSIS — R739 Hyperglycemia, unspecified: Secondary | ICD-10-CM | POA: Diagnosis not present

## 2023-07-22 DIAGNOSIS — I77819 Aortic ectasia, unspecified site: Secondary | ICD-10-CM

## 2023-07-22 DIAGNOSIS — K219 Gastro-esophageal reflux disease without esophagitis: Secondary | ICD-10-CM | POA: Diagnosis not present

## 2023-07-22 DIAGNOSIS — I10 Essential (primary) hypertension: Secondary | ICD-10-CM

## 2023-07-22 DIAGNOSIS — Z8546 Personal history of malignant neoplasm of prostate: Secondary | ICD-10-CM | POA: Diagnosis not present

## 2023-07-22 NOTE — Progress Notes (Signed)
Subjective:    Patient ID: Edward James, male    DOB: Feb 01, 1941, 82 y.o.   MRN: [id number]  Patient here for  Chief Complaint  Patient presents with   Medical Management of Chronic Issues    HPI Here to follow up regarding hypercholesterolemia, afib and hypertension. S/p DCCV 8/26 - held sinus rhythm for about a week. On amiodarone. S/p successful DCCV 10/25. Had f/u 07/13/23 - converted to typical aflutter. Recommended increased amiodarone to 200mg  bid x 2 weeks and then back to 200mg  daily.  Repeat DCCV in one month. He is doing well. No chest pain or sob reported.  No abdominal pain.  No increased problems with constipation.  Tries to stay active.     Past Medical History:  Diagnosis Date   Atrial fibrillation (HCC)    Diverticulosis    Dysrhythmia    GERD (gastroesophageal reflux disease)    Hypercholesterolemia    Hypertension    Left atrial dilation    Mild aortic regurgitation    Mild concentric left ventricular hypertrophy (LVH)    Mild mitral regurgitation by prior echocardiogram    Prostate cancer [facility])    s/p radical retropubic prostatectomy 1999   PVCs (premature ventricular contractions)    Right atrial dilation    Spinal stenosis    SVT (supraventricular tachycardia) (HCC)    Past Surgical History:  Procedure Laterality Date   BIOPSY  05/19/2019   Procedure: BIOPSY;  Surgeon: Carman Ching, MD;  [facility];  Service: Endoscopy;;   CARDIOVERSION N/A 04/27/2023   Procedure: CARDIOVERSION;  Surgeon: Debbe Odea, MD;  Location: [facility];  Service: Cardiovascular;  Laterality: N/A;   CARDIOVERSION N/A 06/26/2023   Procedure: CARDIOVERSION;  Surgeon: Debbe Odea, MD;  Location: [facility];  Service: Cardiovascular;  Laterality: N/A;   COLONOSCOPY WITH PROPOFOL N/A 05/19/2019   Procedure: COLONOSCOPY WITH PROPOFOL;  Surgeon: Carman Ching, MD;  [facility];  Service: Endoscopy;  Laterality: N/A;   ESOPHAGOGASTRODUODENOSCOPY (EGD)  WITH PROPOFOL N/A 05/19/2019   Procedure: ESOPHAGOGASTRODUODENOSCOPY (EGD) WITH PROPOFOL;  Surgeon: Carman Ching, MD;  [facility];  Service: Endoscopy;  Laterality: N/A;   FOLEY CATHETER     INGUINAL HERNIA REPAIR  1945   INGUINAL HERNIA REPAIR Left 12/04/2015   Procedure: HERNIA REPAIR INGUINAL ADULT;  Surgeon: Nadeen Landau, MD;  Location: [facility];  Service: General;  Laterality: Left;   LUMBAR FUSION     L3-4 fusion   pilonidal cyst removal  1969   POLYPECTOMY  05/19/2019   Procedure: POLYPECTOMY;  Surgeon: Carman Ching, MD;  [facility];  Service: Endoscopy;;   RETROPUBIC PROSTATECTOMY     radical (1999)   TONSILLECTOMY AND ADENOIDECTOMY  1948   Family History  Problem Relation Age of Onset   Arthritis Mother    Dementia Mother    AAA (abdominal aortic aneurysm) Father    Nephrolithiasis Other    Colon cancer Neg Hx    Social History   Socioeconomic History   Marital status: Married    Spouse name: Not on file   Number of children: 3   Years of education: Not on file   Highest education level: Not on file  Occupational History   Not on file  Tobacco Use   Smoking status: Former    Current packs/day: 0.00    Types: Cigarettes    Quit date: 1964    Years since quitting: 60.9   Smokeless tobacco: Never   Tobacco comments:  Social smoker for 2 yrs in the [employer]  Vaping Use   Vaping status: Never Used  Substance and Sexual Activity   Alcohol use: Not Currently    Comment: rarely   Drug use: No   Sexual activity: Not on file  Other Topics Concern   Not on file  Social History Narrative   Not on file   Social Determinants of Health   Financial Resource Strain: Low Risk  (11/22/2020)   Overall Financial Resource Strain (CARDIA)    Difficulty of Paying Living Expenses: Not hard at all  Food Insecurity: No Food Insecurity (11/22/2020)   Hunger Vital Sign    Worried About Running Out of Food in the Last Year: Never true    Ran Out  of Food in the Last Year: Never true  Transportation Needs: No Transportation Needs (11/22/2020)   PRAPARE - Administrator, Civil Service (Medical): No    Lack of Transportation (Non-Medical): No  Physical Activity: Not on file  Stress: No Stress Concern Present (11/22/2020)   [employer] of Occupational Health - Occupational Stress Questionnaire    Feeling of Stress : Not at all  Social Connections: Unknown (11/22/2020)   Social Connection and Isolation Panel [NHANES]    Frequency of Communication with Friends and Family: Not on file    Frequency of Social Gatherings with Friends and Family: Not on file    Attends Religious Services: Not on file    Active Member of Clubs or Organizations: Not on file    Attends Banker Meetings: Not on file    Marital Status: Married     Review of Systems  Constitutional:  Negative for appetite change and unexpected weight change.  HENT:  Negative for congestion and sinus pressure.   Respiratory:  Negative for cough, chest tightness and shortness of breath.   Cardiovascular:  Negative for chest pain, palpitations and leg swelling.  Gastrointestinal:  Negative for abdominal pain, diarrhea, nausea and vomiting.  Genitourinary:  Negative for difficulty urinating and dysuria.  Musculoskeletal:  Negative for joint swelling and myalgias.  Skin:  Negative for color change and rash.  Neurological:  Negative for dizziness and headaches.  Psychiatric/Behavioral:  Negative for agitation and dysphoric mood.        Objective:     BP 128/76   Pulse 90   Temp 98 F (36.7 C)   Resp 16   Ht 6\' 4"  (1.93 m)   Wt 239 lb (108.4 kg)   SpO2 98%   BMI 29.09 kg/m  Wt Readings from Last 3 Encounters:  07/22/23 239 lb (108.4 kg)  07/13/23 236 lb 3.2 oz (107.1 kg)  06/26/23 233 lb (105.7 kg)    Physical Exam Constitutional:      General: He is not in acute distress.    Appearance: Normal appearance. He is well-developed.   HENT:     Head: Normocephalic and atraumatic.     Right Ear: External ear normal.     Left Ear: External ear normal.  Eyes:     General: No scleral icterus.       Right eye: No discharge.        Left eye: No discharge.  Cardiovascular:     Rate and Rhythm: Normal rate and regular rhythm.  Pulmonary:     Effort: Pulmonary effort is normal. No respiratory distress.     Breath sounds: Normal breath sounds.  Abdominal:     General: Bowel sounds are normal.  Palpations: Abdomen is soft.     Tenderness: There is no abdominal tenderness.  Musculoskeletal:        General: No swelling or tenderness.     Cervical back: Neck supple. No tenderness.  Lymphadenopathy:     Cervical: No cervical adenopathy.  Skin:    Findings: No erythema or rash.  Neurological:     Mental Status: He is alert.  Psychiatric:        Mood and Affect: Mood normal.        Behavior: Behavior normal.      Outpatient Encounter Medications as of 07/22/2023  Medication Sig   amiodarone (PACERONE) 200 MG tablet Take 1 tablet (200 mg total) by mouth 2 (two) times daily for 14 days, THEN 1 tablet (200 mg total) daily.   apixaban (ELIQUIS) 5 MG TABS tablet Take 1 tablet (5 mg total) by mouth 2 (two) times daily.   diltiazem (CARDIZEM CD) 180 MG 24 hr capsule Take 1 capsule (180 mg total) by mouth daily.   lisinopril (ZESTRIL) 20 MG tablet Take 1 tablet (20 mg total) by mouth daily.   Multiple Vitamins-Minerals (PRESERVISION AREDS PO) Take 2 tablets by mouth daily.   Omega-3 Fatty Acids (FISH OIL) 1200 MG CAPS Take 1,200 mg by mouth 2 (two) times daily.   pantoprazole (PROTONIX) 40 MG tablet Take 1 tablet (40 mg total) by mouth daily.   Probiotic Product (ALIGN) 4 MG CAPS Take 4 mg by mouth every evening.   rosuvastatin (CRESTOR) 10 MG tablet Take 1 tablet (10 mg total) by mouth daily.   triamcinolone (NASACORT) 55 MCG/ACT AERO nasal inhaler Place 2 sprays into the nose at bedtime.   No facility-administered  encounter medications on file as of 07/22/2023.     Lab Results  Component Value Date   WBC 6.7 06/17/2023   HGB 14.8 06/17/2023   HCT 45.9 06/17/2023   PLT 243 06/17/2023   GLUCOSE 95 07/20/2023   CHOL 119 07/20/2023   TRIG 59.0 07/20/2023   HDL 47.00 07/20/2023   LDLCALC 60 07/20/2023   ALT 22 07/20/2023   AST 16 07/20/2023   NA 141 07/20/2023   K 4.3 07/20/2023   CL 105 07/20/2023   CREATININE 0.96 07/20/2023   BUN 17 07/20/2023   CO2 28 07/20/2023   TSH 4.19 07/20/2023   PSA 0.02 (L) 07/20/2023   HGBA1C 5.8 07/20/2023       Assessment & Plan:  Primary hypertension Assessment & Plan: Continue diltiazem and lisinopril.  Continue current medication.  Follow pressures.  Follow metabolic panel.     Hyperglycemia Assessment & Plan: Low carb diet and exercise.  Follow met b and a1c.    Hypercholesterolemia Assessment & Plan: Continue crestor.  Low cholesterol diet and exercise.  Follow lipid panel and liver function tests.    History of prostate cancer Assessment & Plan: PSA 07/20/23 - .02.  Followed by urology.    Gastroesophageal reflux disease, unspecified whether esophagitis present Assessment & Plan: No upper symptoms reported.  On protonix.    Dilation of aorta [facility]) Assessment & Plan: Cardiology 05/18/23 [facility]) -  dilation of aortic root. 05/2023 TTE with 44mm aortic root. Repeat echo in 1 year. Recommend BP control, goal being < 130/90   Paroxysmal atrial fibrillation [facility]) Assessment & Plan:  S/p DCCV 8/26 - held sinus rhythm for about a week. On amiodarone. S/p successful DCCV 10/25. Had f/u 07/13/23 - converted to typical aflutter. Recommended increased amiodarone to 200mg  bid x  2 weeks and then back to 200mg  daily.  Repeat DCCV in one month.       Dale [place], MD

## 2023-07-24 ENCOUNTER — Encounter: Payer: Self-pay | Admitting: Ophthalmology

## 2023-07-24 NOTE — Anesthesia Preprocedure Evaluation (Addendum)
Anesthesia Evaluation  Patient identified by MRN, date of birth, ID band Patient awake    Reviewed: Allergy & Precautions, H&P , NPO status , Patient's Chart, lab work & pertinent test results  Airway Mallampati: III  TM Distance: <3 FB Neck ROM: Full  Mouth opening: Limited Mouth Opening  Dental no notable dental hx.    Pulmonary former smoker   Pulmonary exam normal breath sounds clear to auscultation       Cardiovascular hypertension, Normal cardiovascular exam+ dysrhythmias  Rhythm:Irregular Rate:Normal  Today, patient is back in atrial fibrillation, heart rate 76.  05-08-23 1. Left ventricular ejection fraction, by estimation, is 55 to 60%. Left  ventricular ejection fraction by 2D MOD biplane is 55.3 %. The left  ventricle has normal function. The left ventricle has no regional wall  motion abnormalities. There is mild left  ventricular hypertrophy. Left ventricular diastolic parameters are  indeterminate.   2. Right ventricular systolic function is normal. The right ventricular  size is normal.   3. Left atrial size was moderately dilated.   4. Right atrial size was mild to moderately dilated.   5. The mitral valve is normal in structure. Mild mitral valve  regurgitation.   6. The aortic valve is tricuspid. Aortic valve regurgitation is mild.   7. Aortic dilatation noted. There is mild dilatation of the aortic root,  measuring 40 mm.   8. The inferior vena cava is normal in size with greater than 50%  respiratory variability, suggesting right atrial pressure of 3 mmHg.     Neuro/Psych negative neurological ROS  negative psych ROS   GI/Hepatic negative GI ROS, Neg liver ROS,GERD  ,,  Endo/Other  negative endocrine ROS    Renal/GU negative Renal ROS  negative genitourinary   Musculoskeletal negative musculoskeletal ROS (+)    Abdominal   Peds negative pediatric ROS (+)  Hematology negative hematology  ROS (+)   Anesthesia Other Findings Prostate cancer (HCC)  Hypertension Hypercholesterolemia GERD (gastroesophageal reflux disease) Diverticulosis  Spinal stenosis Dysrhythmia  Atrial fibrillation (HCC) Recent cardioversion of A fib Jun 26, 2023 SVT (supraventricular tachycardia)   PVCs (premature ventricular contractions) Mild mitral regurgitation by prior echocardiogram  Mild aortic regurgitation Mild concentric left ventricular hypertrophy (LVH)  Left atrial dilation Right atrial dilation   Patient in atrial fibrillation today. Discussed w/patient and his wife.  He cannot tell he is in a fib. Strongly urged them to notify cardiologist that he is again in Atrial fib.    Reproductive/Obstetrics negative OB ROS                             Anesthesia Physical Anesthesia Plan  ASA: 3  Anesthesia Plan: MAC   Post-op Pain Management:    Induction: Intravenous  PONV Risk Score and Plan:   Airway Management Planned: Natural Airway and Nasal Cannula  Additional Equipment:   Intra-op Plan:   Post-operative Plan:   Informed Consent: I have reviewed the patients History and Physical, chart, labs and discussed the procedure including the risks, benefits and alternatives for the proposed anesthesia with the patient or authorized representative who has indicated his/her understanding and acceptance.     Dental Advisory Given  Plan Discussed with: Anesthesiologist, CRNA and Surgeon  Anesthesia Plan Comments: (Patient consented for risks of anesthesia including but not limited to:  - adverse reactions to medications - damage to eyes, teeth, lips or other oral mucosa - nerve damage due to  positioning  - sore throat or hoarseness - Damage to heart, brain, nerves, lungs, other parts of body or loss of life  Patient voiced understanding and assent.)       Anesthesia Quick Evaluation

## 2023-07-24 NOTE — Discharge Instructions (Signed)

## 2023-07-26 ENCOUNTER — Encounter: Payer: Self-pay | Admitting: Internal Medicine

## 2023-07-26 NOTE — Assessment & Plan Note (Signed)
Cardiology 05/18/23 Rosalita Chessman Riddle) -  dilation of aortic root. 05/2023 TTE with 44mm aortic root. Repeat echo in 1 year. Recommend BP control, goal being < 130/90

## 2023-07-26 NOTE — Assessment & Plan Note (Signed)
Continue crestor.  Low cholesterol diet and exercise.  Follow lipid panel and liver function tests.  

## 2023-07-26 NOTE — Assessment & Plan Note (Signed)
No upper symptoms reported.  On protonix.

## 2023-07-26 NOTE — Assessment & Plan Note (Signed)
Low carb diet and exercise.  Follow met b and a1c.   

## 2023-07-26 NOTE — Assessment & Plan Note (Signed)
Continue diltiazem and lisinopril.  Continue current medication.  Follow pressures.  Follow metabolic panel.

## 2023-07-26 NOTE — Assessment & Plan Note (Signed)
PSA 07/20/23 - .02.  Followed by urology.

## 2023-07-26 NOTE — Assessment & Plan Note (Signed)
S/p DCCV 8/26 - held sinus rhythm for about a week. On amiodarone. S/p successful DCCV 10/25. Had f/u 07/13/23 - converted to typical aflutter. Recommended increased amiodarone to 200mg  bid x 2 weeks and then back to 200mg  daily.  Repeat DCCV in one month.

## 2023-07-27 ENCOUNTER — Other Ambulatory Visit: Payer: Self-pay

## 2023-07-27 ENCOUNTER — Encounter
Admission: RE | Disposition: A | Discharge status: Home / Self Care | Payer: Self-pay | Source: Home / Self Care | Attending: Ophthalmology

## 2023-07-27 ENCOUNTER — Encounter: Payer: Self-pay | Admitting: Ophthalmology

## 2023-07-27 ENCOUNTER — Ambulatory Visit
Admission: RE | Admit: 2023-07-27 | Discharge: 2023-07-27 | Disposition: A | Discharge status: Home / Self Care | Payer: Medicare PPO | Attending: Ophthalmology | Admitting: Ophthalmology

## 2023-07-27 ENCOUNTER — Ambulatory Visit: Payer: Medicare PPO | Admitting: Anesthesiology

## 2023-07-27 DIAGNOSIS — I10 Essential (primary) hypertension: Secondary | ICD-10-CM | POA: Diagnosis not present

## 2023-07-27 DIAGNOSIS — E7800 Pure hypercholesterolemia, unspecified: Secondary | ICD-10-CM | POA: Insufficient documentation

## 2023-07-27 DIAGNOSIS — Z87891 Personal history of nicotine dependence: Secondary | ICD-10-CM | POA: Insufficient documentation

## 2023-07-27 DIAGNOSIS — Z01810 Encounter for preprocedural cardiovascular examination: Secondary | ICD-10-CM | POA: Diagnosis not present

## 2023-07-27 DIAGNOSIS — Z8546 Personal history of malignant neoplasm of prostate: Secondary | ICD-10-CM | POA: Insufficient documentation

## 2023-07-27 DIAGNOSIS — I080 Rheumatic disorders of both mitral and aortic valves: Secondary | ICD-10-CM | POA: Insufficient documentation

## 2023-07-27 DIAGNOSIS — Z7901 Long term (current) use of anticoagulants: Secondary | ICD-10-CM | POA: Diagnosis not present

## 2023-07-27 DIAGNOSIS — Z79899 Other long term (current) drug therapy: Secondary | ICD-10-CM | POA: Diagnosis not present

## 2023-07-27 DIAGNOSIS — I4891 Unspecified atrial fibrillation: Secondary | ICD-10-CM | POA: Insufficient documentation

## 2023-07-27 DIAGNOSIS — H2511 Age-related nuclear cataract, right eye: Secondary | ICD-10-CM | POA: Insufficient documentation

## 2023-07-27 DIAGNOSIS — K219 Gastro-esophageal reflux disease without esophagitis: Secondary | ICD-10-CM | POA: Insufficient documentation

## 2023-07-27 DIAGNOSIS — H269 Unspecified cataract: Secondary | ICD-10-CM | POA: Diagnosis not present

## 2023-07-27 HISTORY — DX: Nonrheumatic mitral (valve) insufficiency: I34.0

## 2023-07-27 HISTORY — DX: Nonrheumatic aortic (valve) insufficiency: I35.1

## 2023-07-27 HISTORY — DX: Supraventricular tachycardia, unspecified: I47.10

## 2023-07-27 HISTORY — PX: CATARACT EXTRACTION W/PHACO: SHX586

## 2023-07-27 HISTORY — DX: Cardiomegaly: I51.7

## 2023-07-27 HISTORY — DX: Ventricular premature depolarization: I49.3

## 2023-07-27 SURGERY — PHACOEMULSIFICATION, CATARACT, WITH IOL INSERTION
Anesthesia: Monitor Anesthesia Care | Site: Eye | Laterality: Right

## 2023-07-27 MED ORDER — TETRACAINE HCL 0.5 % OP SOLN
1.0000 [drp] | OPHTHALMIC | Status: DC | PRN
  Administered 2023-07-27 (×3): 1 [drp] via OPHTHALMIC

## 2023-07-27 MED ORDER — MOXIFLOXACIN HCL 0.5 % OP SOLN
OPHTHALMIC | Status: DC | PRN
  Administered 2023-07-27: .2 mL via OPHTHALMIC

## 2023-07-27 MED ORDER — SIGHTPATH DOSE#1 BSS IO SOLN
INTRAOCULAR | Status: DC | PRN
  Administered 2023-07-27: 15 mL

## 2023-07-27 MED ORDER — LIDOCAINE HCL (PF) 2 % IJ SOLN
INTRAOCULAR | Status: DC | PRN
  Administered 2023-07-27: 1 mL via INTRAOCULAR

## 2023-07-27 MED ORDER — MIDAZOLAM HCL 2 MG/2ML IJ SOLN
INTRAMUSCULAR | Status: DC | PRN
  Administered 2023-07-27: 2 mg via INTRAVENOUS

## 2023-07-27 MED ORDER — MIDAZOLAM HCL 2 MG/2ML IJ SOLN
INTRAMUSCULAR | Status: AC
Start: 2023-07-27 — End: ?
  Filled 2023-07-27: qty 2

## 2023-07-27 MED ORDER — SIGHTPATH DOSE#1 NA HYALUR & NA CHOND-NA HYALUR IO KIT
PACK | INTRAOCULAR | Status: DC | PRN
  Administered 2023-07-27: 1 via OPHTHALMIC

## 2023-07-27 MED ORDER — TETRACAINE HCL 0.5 % OP SOLN
OPHTHALMIC | Status: AC
  Filled 2023-07-27: qty 4

## 2023-07-27 MED ORDER — SIGHTPATH DOSE#1 BSS IO SOLN
INTRAOCULAR | Status: DC | PRN
  Administered 2023-07-27: 88 mL via OPHTHALMIC

## 2023-07-27 MED ORDER — ARMC OPHTHALMIC DILATING DROPS
OPHTHALMIC | Status: AC
  Filled 2023-07-27: qty 0.5

## 2023-07-27 MED ORDER — ARMC OPHTHALMIC DILATING DROPS
1.0000 | OPHTHALMIC | Status: DC | PRN
  Administered 2023-07-27 (×3): 1 via OPHTHALMIC

## 2023-07-27 SURGICAL SUPPLY — 11 items
CATARACT SUITE SIGHTPATH (MISCELLANEOUS) ×1
DISSECTOR HYDRO NUCLEUS 50X22 (MISCELLANEOUS) ×1 IMPLANT
FEE CATARACT SUITE SIGHTPATH (MISCELLANEOUS) ×1 IMPLANT
GLOVE PI ULTRA LF STRL 7.5 (GLOVE) ×1 IMPLANT
GLOVE SURG POLYISOPRENE 8.5 (GLOVE) ×1
GLOVE SURG SYN 8.5 PF PI BL (GLOVE) ×1 IMPLANT
LENS IOL TECNIS EYHANCE 22.0 (Intraocular Lens) IMPLANT
NDL FILTER BLUNT 18X1 1/2 (NEEDLE) ×1 IMPLANT
NEEDLE FILTER BLUNT 18X1 1/2 (NEEDLE) ×1
SYR 3ML LL SCALE MARK (SYRINGE) ×1 IMPLANT
SYR 5ML LL (SYRINGE) ×1 IMPLANT

## 2023-07-27 NOTE — Op Note (Signed)
OPERATIVE NOTE  Edward James [id number] 07/27/2023   PREOPERATIVE DIAGNOSIS:  Nuclear sclerotic cataract right eye.  H25.11   POSTOPERATIVE DIAGNOSIS:    Nuclear sclerotic cataract right eye.     PROCEDURE:  Phacoemusification with posterior chamber intraocular lens placement of the right eye   LENS:   Implant Name Type Inv. Item Serial No. Manufacturer Lot No. LRB No. Used Action  LENS IOL TECNIS EYHANCE 22.0 - [id number] Intraocular Lens LENS IOL TECNIS EYHANCE 22.0 [id number] [employer]  Right 1 Implanted       Procedure(s): CATARACT EXTRACTION PHACO AND INTRAOCULAR LENS PLACEMENT (IOC) RIGHT  3.83  00:33.9 (Right)   SURGEON:  Willey Blade, MD, MPH  ANESTHESIOLOGIST: Anesthesiologist: Marisue Humble, MD CRNA: Emeterio Reeve, CRNA   ANESTHESIA:  Topical with tetracaine drops augmented with 1% preservative-free intracameral lidocaine.  ESTIMATED BLOOD LOSS: less than 1 mL.   COMPLICATIONS:  None.   DESCRIPTION OF PROCEDURE:  The patient was identified in the holding room and transported to the operating room and placed in the supine position under the operating microscope.  The right eye was identified as the operative eye and it was prepped and draped in the usual sterile ophthalmic fashion.   A 1.0 millimeter clear-corneal paracentesis was made at the 10:30 position. 0.5 ml of preservative-free 1% lidocaine with epinephrine was injected into the anterior chamber.  The anterior chamber was filled with viscoelastic.  A 2.4 millimeter keratome was used to make a near-clear corneal incision at the 8:00 position.  A curvilinear capsulorrhexis was made with a cystotome and capsulorrhexis forceps.  Balanced salt solution was used to hydrodissect and hydrodelineate the nucleus.   Phacoemulsification was then used in stop and chop fashion to remove the lens nucleus and epinucleus.  The remaining cortex was then removed using the irrigation and aspiration handpiece. Viscoelastic  was then placed into the capsular bag to distend it for lens placement.  A lens was then injected into the capsular bag.  The remaining viscoelastic was aspirated.   Wounds were hydrated with balanced salt solution.  The anterior chamber was inflated to a physiologic pressure with balanced salt solution.   Intracameral vigamox 0.1 mL undiluted was injected into the eye and a drop placed onto the ocular surface.  No wound leaks were noted.  The patient was taken to the recovery room in stable condition without complications of anesthesia or surgery  Willey Blade 07/27/2023, 11:20 AM

## 2023-07-27 NOTE — Transfer of Care (Signed)
Immediate Anesthesia Transfer of Care Note  Patient: Edward James  Procedure(s) Performed: CATARACT EXTRACTION PHACO AND INTRAOCULAR LENS PLACEMENT (IOC) RIGHT  3.83  00:33.9 (Right: Eye)  Patient Location: PACU  Anesthesia Type: MAC  Level of Consciousness: awake, alert  and patient cooperative  Airway and Oxygen Therapy: Patient Spontanous Breathing and Patient connected to supplemental oxygen  Post-op Assessment: Post-op Vital signs reviewed, Patient's Cardiovascular Status Stable, Respiratory Function Stable, Patent Airway and No signs of Nausea or vomiting  Post-op Vital Signs: Reviewed and stable  Complications: No notable events documented.

## 2023-07-27 NOTE — Anesthesia Postprocedure Evaluation (Signed)
Anesthesia Post Note  Patient: Lynette Adu  Procedure(s) Performed: CATARACT EXTRACTION PHACO AND INTRAOCULAR LENS PLACEMENT (IOC) RIGHT  3.83  00:33.9 (Right: Eye)  Patient location during evaluation: PACU Anesthesia Type: MAC Level of consciousness: awake and alert Pain management: pain level controlled Vital Signs Assessment: post-procedure vital signs reviewed and stable Respiratory status: spontaneous breathing, nonlabored ventilation, respiratory function stable and patient connected to nasal cannula oxygen Cardiovascular status: stable and blood pressure returned to baseline Postop Assessment: no apparent nausea or vomiting Anesthetic complications: no   No notable events documented.   Last Vitals:  Vitals:   07/27/23 1125 07/27/23 1126  BP:  113/74  Pulse: 70 68  Resp: 20 15  Temp:  36.5 C  SpO2: 98% 99%    Last Pain:  Vitals:   07/27/23 1126  TempSrc:   PainSc: 0-No pain                 Cambry Spampinato C Tamieka Rancourt

## 2023-07-27 NOTE — H&P (Signed)
[facility]   Primary Care Physician:  Dale [place], MD Ophthalmologist: Dr. Willey Blade  Pre-Procedure History & Physical: HPI:  Edward James is a 82 y.o. male here for cataract surgery.   Past Medical History:  Diagnosis Date   Atrial fibrillation (HCC)    Diverticulosis    Dysrhythmia    GERD (gastroesophageal reflux disease)    Hypercholesterolemia    Hypertension    Left atrial dilation    Mild aortic regurgitation    Mild concentric left ventricular hypertrophy (LVH)    Mild mitral regurgitation by prior echocardiogram    Prostate cancer [facility])    s/p radical retropubic prostatectomy 1999   PVCs (premature ventricular contractions)    Right atrial dilation    Spinal stenosis    SVT (supraventricular tachycardia) (HCC)     Past Surgical History:  Procedure Laterality Date   BIOPSY  05/19/2019   Procedure: BIOPSY;  Surgeon: Carman Ching, MD;  Location: [facility];  Service: Endoscopy;;   CARDIOVERSION N/A 04/27/2023   Procedure: CARDIOVERSION;  Surgeon: Debbe Odea, MD;  Location: [facility] ORS;  Service: Cardiovascular;  Laterality: N/A;   CARDIOVERSION N/A 06/26/2023   Procedure: CARDIOVERSION;  Surgeon: Debbe Odea, MD;  Location: [facility] ORS;  Service: Cardiovascular;  Laterality: N/A;   COLONOSCOPY WITH PROPOFOL N/A 05/19/2019   Procedure: COLONOSCOPY WITH PROPOFOL;  Surgeon: Carman Ching, MD;  Location: [facility];  Service: Endoscopy;  Laterality: N/A;   ESOPHAGOGASTRODUODENOSCOPY (EGD) WITH PROPOFOL N/A 05/19/2019   Procedure: ESOPHAGOGASTRODUODENOSCOPY (EGD) WITH PROPOFOL;  Surgeon: Carman Ching, MD;  Location: [facility];  Service: Endoscopy;  Laterality: N/A;   FOLEY CATHETER     INGUINAL HERNIA REPAIR  1945   INGUINAL HERNIA REPAIR Left 12/04/2015   Procedure: HERNIA REPAIR INGUINAL ADULT;  Surgeon: Nadeen Landau, MD;  Location: [facility] ORS;  Service: General;  Laterality: Left;   LUMBAR FUSION     L3-4 fusion   pilonidal cyst  removal  1969   POLYPECTOMY  05/19/2019   Procedure: POLYPECTOMY;  Surgeon: Carman Ching, MD;  Location: [facility];  Service: Endoscopy;;   RETROPUBIC PROSTATECTOMY     radical (1999)   TONSILLECTOMY AND ADENOIDECTOMY  1948    Prior to Admission medications   Medication Sig Start Date End Date Taking? Authorizing Provider  amiodarone (PACERONE) 200 MG tablet Take 1 tablet (200 mg total) by mouth 2 (two) times daily for 14 days, THEN 1 tablet (200 mg total) daily. 07/13/23 10/11/23 Yes Sherie Don, NP  apixaban (ELIQUIS) 5 MG TABS tablet Take 1 tablet (5 mg total) by mouth 2 (two) times daily. 04/14/23  Yes Sherie Don, NP  diltiazem (CARDIZEM CD) 180 MG 24 hr capsule Take 1 capsule (180 mg total) by mouth daily. 11/17/22  Yes Dale [place], MD  lisinopril (ZESTRIL) 20 MG tablet Take 1 tablet (20 mg total) by mouth daily. 11/17/22  Yes Dale [place], MD  Multiple Vitamins-Minerals (PRESERVISION AREDS PO) Take 2 tablets by mouth daily.   Yes [provider]  Omega-3 Fatty Acids (FISH OIL) 1200 MG CAPS Take 1,200 mg by mouth 2 (two) times daily.   Yes [provider]  pantoprazole (PROTONIX) 40 MG tablet Take 1 tablet (40 mg total) by mouth daily. 11/17/22  Yes Dale [place], MD  Probiotic Product (ALIGN) 4 MG CAPS Take 4 mg by mouth every evening.   Yes [provider]  rosuvastatin (CRESTOR) 10 MG tablet Take 1 tablet (10 mg total) by mouth daily. 11/17/22  Yes Dale [place],  MD  triamcinolone (NASACORT) 55 MCG/ACT AERO nasal inhaler Place 2 sprays into the nose at bedtime.   Yes [provider]    Allergies as of 05/26/2023   (No Known Allergies)    Family History  Problem Relation Age of Onset   Arthritis Mother    Dementia Mother    AAA (abdominal aortic aneurysm) Father    Nephrolithiasis Other    Colon cancer Neg Hx     Social History   Socioeconomic History   Marital status: Married    Spouse name: Not on file   Number of  children: 3   Years of education: Not on file   Highest education level: Not on file  Occupational History   Not on file  Tobacco Use   Smoking status: Former    Current packs/day: 0.00    Types: Cigarettes    Quit date: 1964    Years since quitting: 60.9   Smokeless tobacco: Never   Tobacco comments:    Social smoker for 2 yrs in the [employer]  Vaping Use   Vaping status: Never Used  Substance and Sexual Activity   Alcohol use: Not Currently    Comment: rarely   Drug use: No   Sexual activity: Not on file  Other Topics Concern   Not on file  Social History Narrative   Not on file   Social Determinants of Health   Financial Resource Strain: Low Risk  (11/22/2020)   Overall Financial Resource Strain (CARDIA)    Difficulty of Paying Living Expenses: Not hard at all  Food Insecurity: No Food Insecurity (11/22/2020)   Hunger Vital Sign    Worried About Running Out of Food in the Last Year: Never true    Ran Out of Food in the Last Year: Never true  Transportation Needs: No Transportation Needs (11/22/2020)   PRAPARE - Administrator, Civil Service (Medical): No    Lack of Transportation (Non-Medical): No  Physical Activity: Not on file  Stress: No Stress Concern Present (11/22/2020)   [employer] of Occupational Health - Occupational Stress Questionnaire    Feeling of Stress : Not at all  Social Connections: Unknown (11/22/2020)   Social Connection and Isolation Panel [NHANES]    Frequency of Communication with Friends and Family: Not on file    Frequency of Social Gatherings with Friends and Family: Not on file    Attends Religious Services: Not on file    Active Member of Clubs or Organizations: Not on file    Attends Banker Meetings: Not on file    Marital Status: Married  Intimate Partner Violence: Not At Risk (11/22/2020)   Humiliation, Afraid, Rape, and Kick questionnaire    Fear of Current or Ex-Partner: No    Emotionally Abused: No     Physically Abused: No    Sexually Abused: No    Review of Systems: See HPI, otherwise negative ROS  Physical Exam: BP (!) 144/86   Temp 97.7 F (36.5 C) (Temporal)   Resp 11   Ht 6' 3.98" (1.93 m)   Wt 106.4 kg   SpO2 97%   BMI 28.56 kg/m  General:   Alert, cooperative in NAD Head:  Normocephalic and atraumatic. Respiratory:  Normal work of breathing. Cardiovascular:  RRR  Impression/Plan: Lorain Whorton is here for cataract surgery.  Risks, benefits, limitations, and alternatives regarding cataract surgery have been reviewed with the patient.  Questions have been answered.  All parties agreeable.  Willey Blade, MD  07/27/2023, 10:54 AM

## 2023-07-28 ENCOUNTER — Encounter: Payer: Self-pay | Admitting: Ophthalmology

## 2023-07-28 DIAGNOSIS — H2512 Age-related nuclear cataract, left eye: Secondary | ICD-10-CM | POA: Diagnosis not present

## 2023-07-29 NOTE — Telephone Encounter (Signed)
Called and spoke with patient. Informed him of the following from Sherie Don, NP.  I agree the EKG looks more like afib than flutter.  I would like for him to see Dr. Graciela Husbands on follow-up. He has openings 12/16 in the AM, ok to use any available slot.   Patient verbalizes understanding. Patient rescheduled for 08/17/23 with Dr. Graciela Husbands.

## 2023-08-03 ENCOUNTER — Encounter: Payer: Self-pay | Admitting: Ophthalmology

## 2023-08-06 NOTE — Discharge Instructions (Signed)

## 2023-08-10 ENCOUNTER — Ambulatory Visit
Admission: RE | Admit: 2023-08-10 | Discharge: 2023-08-10 | Disposition: A | Discharge status: Home / Self Care | Payer: Medicare PPO | Attending: Ophthalmology | Admitting: Ophthalmology

## 2023-08-10 ENCOUNTER — Encounter: Payer: Self-pay | Admitting: Ophthalmology

## 2023-08-10 ENCOUNTER — Encounter
Admission: RE | Disposition: A | Discharge status: Home / Self Care | Payer: Self-pay | Source: Home / Self Care | Attending: Ophthalmology

## 2023-08-10 ENCOUNTER — Ambulatory Visit: Payer: Medicare PPO | Admitting: Anesthesiology

## 2023-08-10 ENCOUNTER — Other Ambulatory Visit: Payer: Self-pay

## 2023-08-10 DIAGNOSIS — I4891 Unspecified atrial fibrillation: Secondary | ICD-10-CM | POA: Insufficient documentation

## 2023-08-10 DIAGNOSIS — Z87891 Personal history of nicotine dependence: Secondary | ICD-10-CM | POA: Insufficient documentation

## 2023-08-10 DIAGNOSIS — H2512 Age-related nuclear cataract, left eye: Secondary | ICD-10-CM | POA: Insufficient documentation

## 2023-08-10 DIAGNOSIS — I10 Essential (primary) hypertension: Secondary | ICD-10-CM | POA: Insufficient documentation

## 2023-08-10 DIAGNOSIS — Z8546 Personal history of malignant neoplasm of prostate: Secondary | ICD-10-CM | POA: Diagnosis not present

## 2023-08-10 DIAGNOSIS — K219 Gastro-esophageal reflux disease without esophagitis: Secondary | ICD-10-CM | POA: Insufficient documentation

## 2023-08-10 DIAGNOSIS — H2511 Age-related nuclear cataract, right eye: Secondary | ICD-10-CM | POA: Diagnosis not present

## 2023-08-10 DIAGNOSIS — I471 Supraventricular tachycardia, unspecified: Secondary | ICD-10-CM | POA: Diagnosis not present

## 2023-08-10 HISTORY — PX: CATARACT EXTRACTION W/PHACO: SHX586

## 2023-08-10 SURGERY — PHACOEMULSIFICATION, CATARACT, WITH IOL INSERTION
Anesthesia: Monitor Anesthesia Care | Site: Eye | Laterality: Left

## 2023-08-10 MED ORDER — SIGHTPATH DOSE#1 BSS IO SOLN
INTRAOCULAR | Status: DC | PRN
  Administered 2023-08-10: 71 mL via OPHTHALMIC

## 2023-08-10 MED ORDER — LIDOCAINE HCL (PF) 2 % IJ SOLN
INTRAOCULAR | Status: DC | PRN
  Administered 2023-08-10: 1 mL via INTRAOCULAR

## 2023-08-10 MED ORDER — MOXIFLOXACIN HCL 0.5 % OP SOLN
OPHTHALMIC | Status: DC | PRN
  Administered 2023-08-10: .2 mL via OPHTHALMIC

## 2023-08-10 MED ORDER — TETRACAINE HCL 0.5 % OP SOLN
OPHTHALMIC | Status: AC
  Filled 2023-08-10: qty 4

## 2023-08-10 MED ORDER — MIDAZOLAM HCL 2 MG/2ML IJ SOLN
INTRAMUSCULAR | Status: DC | PRN
  Administered 2023-08-10: 2 mg via INTRAVENOUS

## 2023-08-10 MED ORDER — SIGHTPATH DOSE#1 BSS IO SOLN
INTRAOCULAR | Status: DC | PRN
  Administered 2023-08-10: 15 mL

## 2023-08-10 MED ORDER — MIDAZOLAM HCL 2 MG/2ML IJ SOLN
INTRAMUSCULAR | Status: AC
  Filled 2023-08-10: qty 2

## 2023-08-10 MED ORDER — TETRACAINE HCL 0.5 % OP SOLN
1.0000 [drp] | OPHTHALMIC | Status: AC
  Administered 2023-08-10 (×3): 1 [drp] via OPHTHALMIC

## 2023-08-10 MED ORDER — SIGHTPATH DOSE#1 NA HYALUR & NA CHOND-NA HYALUR IO KIT
PACK | INTRAOCULAR | Status: DC | PRN
  Administered 2023-08-10: 1 via OPHTHALMIC

## 2023-08-10 MED ORDER — ARMC OPHTHALMIC DILATING DROPS
1.0000 | OPHTHALMIC | Status: AC
  Administered 2023-08-10 (×3): 1 via OPHTHALMIC

## 2023-08-10 SURGICAL SUPPLY — 17 items
CANNULA ANT/CHMB 27G (MISCELLANEOUS) IMPLANT
CANNULA ANT/CHMB 27GA (MISCELLANEOUS)
CATARACT SUITE SIGHTPATH (MISCELLANEOUS) ×1
DISSECTOR HYDRO NUCLEUS 50X22 (MISCELLANEOUS) ×1 IMPLANT
FEE CATARACT SUITE SIGHTPATH (MISCELLANEOUS) ×1 IMPLANT
GLOVE PI ULTRA LF STRL 7.5 (GLOVE) ×1 IMPLANT
GLOVE SURG POLYISOPRENE 8.5 (GLOVE) ×1
GLOVE SURG SYN 8.5 PF PI BL (GLOVE) ×1 IMPLANT
LENS IOL TECNIS EYHANCE 22.5 (Intraocular Lens) IMPLANT
NDL FILTER BLUNT 18X1 1/2 (NEEDLE) ×1 IMPLANT
NEEDLE FILTER BLUNT 18X1 1/2 (NEEDLE) ×1
PACK VIT ANT 23G (MISCELLANEOUS) IMPLANT
RING MALYGIN (MISCELLANEOUS) IMPLANT
SUT ETHILON 10-0 CS-B-6CS-B-6 (SUTURE)
SUTURE EHLN 10-0 CS-B-6CS-B-6 (SUTURE) IMPLANT
SYR 3ML LL SCALE MARK (SYRINGE) ×1 IMPLANT
SYR 5ML LL (SYRINGE) ×1 IMPLANT

## 2023-08-10 NOTE — Transfer of Care (Signed)
Immediate Anesthesia Transfer of Care Note  Patient: Edward James  Procedure(s) Performed: CATARACT EXTRACTION PHACO AND INTRAOCULAR LENS PLACEMENT (IOC) LEFT (Left: Eye)  Patient Location: PACU  Anesthesia Type: MAC  Level of Consciousness: awake, alert  and patient cooperative  Airway and Oxygen Therapy: Patient Spontanous Breathing and Patient connected to supplemental oxygen  Post-op Assessment: Post-op Vital signs reviewed, Patient's Cardiovascular Status Stable, Respiratory Function Stable, Patent Airway and No signs of Nausea or vomiting  Post-op Vital Signs: Reviewed and stable  Complications: No notable events documented.

## 2023-08-10 NOTE — Anesthesia Postprocedure Evaluation (Signed)
Anesthesia Post Note  Patient: Edward James  Procedure(s) Performed: CATARACT EXTRACTION PHACO AND INTRAOCULAR LENS PLACEMENT (IOC) LEFT (Left: Eye)  Patient location during evaluation: PACU Anesthesia Type: MAC Level of consciousness: awake and alert Pain management: pain level controlled Vital Signs Assessment: post-procedure vital signs reviewed and stable Respiratory status: spontaneous breathing, nonlabored ventilation, respiratory function stable and patient connected to nasal cannula oxygen Cardiovascular status: stable and blood pressure returned to baseline Postop Assessment: no apparent nausea or vomiting Anesthetic complications: no   No notable events documented.   Last Vitals:  Vitals:   08/10/23 1134 08/10/23 1139  BP: 110/74   Pulse: (!) 58   Resp: 20   Temp: 36.6 C 36.6 C  SpO2: 97%     Last Pain:  Vitals:   08/10/23 1134  TempSrc:   PainSc: 0-No pain                 Maxxwell Edgett C Nikolina Simerson

## 2023-08-10 NOTE — Op Note (Signed)
OPERATIVE NOTE  Edward James [id number] 08/10/2023   PREOPERATIVE DIAGNOSIS:  Nuclear sclerotic cataract left eye.  H25.12   POSTOPERATIVE DIAGNOSIS:    Nuclear sclerotic cataract left eye.     PROCEDURE:  Phacoemusification with posterior chamber intraocular lens placement of the left eye   LENS:   Implant Name Type Inv. Item Serial No. Manufacturer Lot No. LRB No. Used Action  LENS IOL TECNIS EYHANCE 22.5 - [id number] Intraocular Lens LENS IOL TECNIS EYHANCE 22.5 [id number] [employer]  Left 1 Implanted      Procedure(s) with comments: CATARACT EXTRACTION PHACO AND INTRAOCULAR LENS PLACEMENT (IOC) LEFT (Left) - 3.48 0:31.0  SURGEON:  Willey Blade, MD, MPH   ANESTHESIA:  Topical with tetracaine drops augmented with 1% preservative-free intracameral lidocaine.  ESTIMATED BLOOD LOSS: <1 mL   COMPLICATIONS:  None.   DESCRIPTION OF PROCEDURE:  The patient was identified in the holding room and transported to the operating room and placed in the supine position under the operating microscope.  The left eye was identified as the operative eye and it was prepped and draped in the usual sterile ophthalmic fashion.   A 1.0 millimeter clear-corneal paracentesis was made at the 5:00 position. 0.5 ml of preservative-free 1% lidocaine with epinephrine was injected into the anterior chamber.  The anterior chamber was filled with viscoelastic.  A 2.4 millimeter keratome was used to make a near-clear corneal incision at the 2:00 position.  A curvilinear capsulorrhexis was made with a cystotome and capsulorrhexis forceps.  Balanced salt solution was used to hydrodissect and hydrodelineate the nucleus.   Phacoemulsification was then used in stop and chop fashion to remove the lens nucleus and epinucleus.  The remaining cortex was then removed using the irrigation and aspiration handpiece. Viscoelastic was then placed into the capsular bag to distend it for lens placement.  A lens was then injected  into the capsular bag.  The remaining viscoelastic was aspirated.   Wounds were hydrated with balanced salt solution.  The anterior chamber was inflated to a physiologic pressure with balanced salt solution.  Intracameral vigamox 0.1 mL undiltued was injected into the eye and a drop placed onto the ocular surface.  No wound leaks were noted.  The patient was taken to the recovery room in stable condition without complications of anesthesia or surgery  Willey Blade 08/10/2023, 11:33 AM

## 2023-08-10 NOTE — Anesthesia Preprocedure Evaluation (Addendum)
Anesthesia Evaluation  Patient identified by MRN, date of birth, ID band Patient awake    Reviewed: Allergy & Precautions, H&P , NPO status , Patient's Chart, lab work & pertinent test results  Airway Mallampati: III  TM Distance: <3 FB Neck ROM: Full    Dental no notable dental hx.    Pulmonary former smoker   Pulmonary exam normal breath sounds clear to auscultation       Cardiovascular hypertension, Normal cardiovascular exam+ dysrhythmias  Rhythm:Regular Rate:Normal  Today, patient is back in atrial fibrillation, heart rate 72 to 74 at present   05-08-23 1. Left ventricular ejection fraction, by estimation, is 55 to 60%. Left  ventricular ejection fraction by 2D MOD biplane is 55.3 %. The left  ventricle has normal function. The left ventricle has no regional wall  motion abnormalities. There is mild left  ventricular hypertrophy. Left ventricular diastolic parameters are  indeterminate.   2. Right ventricular systolic function is normal. The right ventricular  size is normal.   3. Left atrial size was moderately dilated.   4. Right atrial size was mild to moderately dilated.   5. The mitral valve is normal in structure. Mild mitral valve  regurgitation.   6. The aortic valve is tricuspid. Aortic valve regurgitation is mild.   7. Aortic dilatation noted. There is mild dilatation of the aortic root,  measuring 40 mm.   8. The inferior vena cava is normal in size with greater than 50%  respiratory variability, suggesting right atrial pressure of 3 mmHg.         Neuro/Psych negative neurological ROS  negative psych ROS   GI/Hepatic negative GI ROS, Neg liver ROS,GERD  ,,  Endo/Other  negative endocrine ROS    Renal/GU negative Renal ROS  negative genitourinary   Musculoskeletal negative musculoskeletal ROS (+)    Abdominal   Peds negative pediatric ROS (+)  Hematology negative hematology ROS (+)    Anesthesia Other Findings  Previous cataract surgery 07-27-23  Prostate cancer (HCC)  Hypertension Hypercholesterolemia GERD (gastroesophageal reflux disease) Diverticulosis  Spinal stenosis Dysrhythmia  Atrial fibrillation (HCC) SVT (supraventricular tachycardia) (HCC) PVCs (premature ventricular contractions) Mild mitral regurgitation by prior echocardiogram  Mild aortic regurgitation Mild concentric left ventricular hypertrophy (LVH)  Left atrial dilation Right atrial dilation      Reproductive/Obstetrics negative OB ROS                             Anesthesia Physical Anesthesia Plan  ASA: 3  Anesthesia Plan: MAC   Post-op Pain Management:    Induction: Intravenous  PONV Risk Score and Plan:   Airway Management Planned: Natural Airway and Nasal Cannula  Additional Equipment:   Intra-op Plan:   Post-operative Plan:   Informed Consent: I have reviewed the patients History and Physical, chart, labs and discussed the procedure including the risks, benefits and alternatives for the proposed anesthesia with the patient or authorized representative who has indicated his/her understanding and acceptance.     Dental Advisory Given  Plan Discussed with: Anesthesiologist, CRNA and Surgeon  Anesthesia Plan Comments: (Patient consented for risks of anesthesia including but not limited to:  - adverse reactions to medications - damage to eyes, teeth, lips or other oral mucosa - nerve damage due to positioning  - sore throat or hoarseness - Damage to heart, brain, nerves, lungs, other parts of body or loss of life  Patient voiced understanding and assent.)  Anesthesia Quick Evaluation

## 2023-08-10 NOTE — H&P (Signed)
[facility]   Primary Care Physician:  Dale [place], MD Ophthalmologist: Dr. Willey Blade  Pre-Procedure History & Physical: HPI:  Edward James is a 82 y.o. male here for cataract surgery.   Past Medical History:  Diagnosis Date   Atrial fibrillation (HCC)    Diverticulosis    Dysrhythmia    GERD (gastroesophageal reflux disease)    Hypercholesterolemia    Hypertension    Left atrial dilation    Mild aortic regurgitation    Mild concentric left ventricular hypertrophy (LVH)    Mild mitral regurgitation by prior echocardiogram    Prostate cancer [facility])    s/p radical retropubic prostatectomy 1999   PVCs (premature ventricular contractions)    Right atrial dilation    Spinal stenosis    SVT (supraventricular tachycardia) (HCC)     Past Surgical History:  Procedure Laterality Date   BIOPSY  05/19/2019   Procedure: BIOPSY;  Surgeon: Carman Ching, MD;  Location: [facility];  Service: Endoscopy;;   CARDIOVERSION N/A 04/27/2023   Procedure: CARDIOVERSION;  Surgeon: Debbe Odea, MD;  Location: [facility];  Service: Cardiovascular;  Laterality: N/A;   CARDIOVERSION N/A 06/26/2023   Procedure: CARDIOVERSION;  Surgeon: Debbe Odea, MD;  Location: [facility];  Service: Cardiovascular;  Laterality: N/A;   CATARACT EXTRACTION W/PHACO Right 07/27/2023   Procedure: CATARACT EXTRACTION PHACO AND INTRAOCULAR LENS PLACEMENT (IOC) RIGHT  3.83  00:33.9;  Surgeon: Nevada Crane, MD;  Location: [facility];  Service: Ophthalmology;  Laterality: Right;   COLONOSCOPY WITH PROPOFOL N/A 05/19/2019   Procedure: COLONOSCOPY WITH PROPOFOL;  Surgeon: Carman Ching, MD;  Location: [facility];  Service: Endoscopy;  Laterality: N/A;   ESOPHAGOGASTRODUODENOSCOPY (EGD) WITH PROPOFOL N/A 05/19/2019   Procedure: ESOPHAGOGASTRODUODENOSCOPY (EGD) WITH PROPOFOL;  Surgeon: Carman Ching, MD;  Location: [facility];  Service: Endoscopy;  Laterality: N/A;   FOLEY CATHETER      INGUINAL HERNIA REPAIR  1945   INGUINAL HERNIA REPAIR Left 12/04/2015   Procedure: HERNIA REPAIR INGUINAL ADULT;  Surgeon: Nadeen Landau, MD;  Location: [facility];  Service: General;  Laterality: Left;   LUMBAR FUSION     L3-4 fusion   pilonidal cyst removal  1969   POLYPECTOMY  05/19/2019   Procedure: POLYPECTOMY;  Surgeon: Carman Ching, MD;  Location: [facility];  Service: Endoscopy;;   RETROPUBIC PROSTATECTOMY     radical (1999)   TONSILLECTOMY AND ADENOIDECTOMY  1948    Prior to Admission medications   Medication Sig Start Date End Date Taking? Authorizing Provider  amiodarone (PACERONE) 200 MG tablet Take 1 tablet (200 mg total) by mouth 2 (two) times daily for 14 days, THEN 1 tablet (200 mg total) daily. 07/13/23 10/11/23 Yes Sherie Don, NP  apixaban (ELIQUIS) 5 MG TABS tablet Take 1 tablet (5 mg total) by mouth 2 (two) times daily. 04/14/23  Yes Sherie Don, NP  diltiazem (CARDIZEM CD) 180 MG 24 hr capsule Take 1 capsule (180 mg total) by mouth daily. 11/17/22  Yes Dale [place], MD  lisinopril (ZESTRIL) 20 MG tablet Take 1 tablet (20 mg total) by mouth daily. 11/17/22  Yes Dale Ila, MD  Multiple Vitamins-Minerals (PRESERVISION AREDS PO) Take 2 tablets by mouth daily.   Yes [provider]  Omega-3 Fatty Acids (FISH OIL) 1200 MG CAPS Take 1,200 mg by mouth 2 (two) times daily.   Yes [provider]  pantoprazole (PROTONIX) 40 MG tablet Take 1 tablet (40 mg total) by mouth daily. 11/17/22  Yes Dale [place], MD  Probiotic Product (ALIGN) 4 MG CAPS Take 4 mg by mouth every evening.   Yes [provider]  rosuvastatin (CRESTOR) 10 MG tablet Take 1 tablet (10 mg total) by mouth daily. 11/17/22  Yes Dale [place], MD  triamcinolone (NASACORT) 55 MCG/ACT AERO nasal inhaler Place 2 sprays into the nose at bedtime.   Yes [provider]    Allergies as of 05/26/2023   (No Known Allergies)    Family History  Problem Relation Age of  Onset   Arthritis Mother    Dementia Mother    AAA (abdominal aortic aneurysm) Father    Nephrolithiasis Other    Colon cancer Neg Hx     Social History   Socioeconomic History   Marital status: Married    Spouse name: Not on file   Number of children: 3   Years of education: Not on file   Highest education level: Not on file  Occupational History   Not on file  Tobacco Use   Smoking status: Former    Current packs/day: 0.00    Types: Cigarettes    Quit date: 1964    Years since quitting: 60.9   Smokeless tobacco: Never   Tobacco comments:    Social smoker for 2 yrs in the [employer]  Vaping Use   Vaping status: Never Used  Substance and Sexual Activity   Alcohol use: Not Currently    Comment: rarely   Drug use: No   Sexual activity: Not on file  Other Topics Concern   Not on file  Social History Narrative   Not on file   Social Determinants of Health   Financial Resource Strain: Low Risk  (11/22/2020)   Overall Financial Resource Strain (CARDIA)    Difficulty of Paying Living Expenses: Not hard at all  Food Insecurity: No Food Insecurity (11/22/2020)   Hunger Vital Sign    Worried About Running Out of Food in the Last Year: Never true    Ran Out of Food in the Last Year: Never true  Transportation Needs: No Transportation Needs (11/22/2020)   PRAPARE - Administrator, Civil Service (Medical): No    Lack of Transportation (Non-Medical): No  Physical Activity: Not on file  Stress: No Stress Concern Present (11/22/2020)   [employer] of Occupational Health - Occupational Stress Questionnaire    Feeling of Stress : Not at all  Social Connections: Unknown (11/22/2020)   Social Connection and Isolation Panel [NHANES]    Frequency of Communication with Friends and Family: Not on file    Frequency of Social Gatherings with Friends and Family: Not on file    Attends Religious Services: Not on file    Active Member of Clubs or Organizations: Not on file     Attends Banker Meetings: Not on file    Marital Status: Married  Intimate Partner Violence: Not At Risk (11/22/2020)   Humiliation, Afraid, Rape, and Kick questionnaire    Fear of Current or Ex-Partner: No    Emotionally Abused: No    Physically Abused: No    Sexually Abused: No    Review of Systems: See HPI, otherwise negative ROS  Physical Exam: BP (!) 153/94   Pulse 75   Temp 97.9 F (36.6 C) (Temporal)   Resp 18   Ht 6' 3.98" (1.93 m)   Wt 106.6 kg   SpO2 99%   BMI 28.62 kg/m  General:   Alert, cooperative in NAD Head:  Normocephalic and atraumatic.  Respiratory:  Normal work of breathing. Cardiovascular:  RRR  Impression/Plan: Edward James is here for cataract surgery.  Risks, benefits, limitations, and alternatives regarding cataract surgery have been reviewed with the patient.  Questions have been answered.  All parties agreeable.   Willey Blade, MD  08/10/2023, 10:40 AM

## 2023-08-11 ENCOUNTER — Encounter: Payer: Self-pay | Admitting: Ophthalmology

## 2023-08-17 ENCOUNTER — Encounter: Payer: Self-pay | Admitting: Internal Medicine

## 2023-08-17 ENCOUNTER — Ambulatory Visit: Payer: Medicare PPO | Attending: Internal Medicine | Admitting: Internal Medicine

## 2023-08-17 VITALS — BP 134/82 | HR 60 | Ht 76.0 in | Wt 240.0 lb

## 2023-08-17 DIAGNOSIS — I493 Ventricular premature depolarization: Secondary | ICD-10-CM

## 2023-08-17 DIAGNOSIS — I471 Supraventricular tachycardia, unspecified: Secondary | ICD-10-CM

## 2023-08-17 DIAGNOSIS — R001 Bradycardia, unspecified: Secondary | ICD-10-CM

## 2023-08-17 NOTE — Progress Notes (Signed)
Patient Care Team: Dale Creighton, MD as PCP - General (Internal Medicine) Duke Salvia, MD as PCP - Electrophysiology (Cardiology) Iran Ouch, MD as PCP - Cardiology (Cardiology)   HPI  Edward James is a 82 y.o. male is seen in followup for syncope and previously identified SVT, frequently occurring and thought to AVNRT. Not assoc  Previously seen 12/19 for SVT identified on a monitor.  Frequent episodes were noted.  No associated symptoms and so no therapy was indicated.   Synocpe summer 2023  No palpitations. .  Unconscious for a couple of minutes.  Residual fatigue and orthostatic intolerance.  His heart rate monitor which he wears on his watch detected a heart rate of 160.  Notably, this is the rate of his SVT as detected previously  It was elected to treat him medically with diltiazem  Recurrent SVT and now recurrent atrial fibrillation.  Identified this summer.  Underwent drug-free cardioversion 8/24 with rapid reversion to atrial fibrillation.  Started on amiodarone 9/24 and underwent cardioversion 10/24 again with rapid reversion to atrial fibrillation.  Perhaps some improvement in symptoms, more energy, for about 4 5 days following cardioversion.  No associated palpitation.  Denies chest pain peripheral edema or lightheadedness.  No bleeding on apixaban     Date Cr K Hgb  11/24 0.96 4.3 14.8             DATE TEST EF   9/24 Echo   55-60 %               Past Medical History:  Diagnosis Date   Atrial fibrillation (HCC)    Diverticulosis    Dysrhythmia    GERD (gastroesophageal reflux disease)    Hypercholesterolemia    Hypertension    Left atrial dilation    Mild aortic regurgitation    Mild concentric left ventricular hypertrophy (LVH)    Mild mitral regurgitation by prior echocardiogram    Prostate cancer [facility])    s/p radical retropubic prostatectomy 1999   PVCs (premature ventricular contractions)    Right atrial dilation    Spinal  stenosis    SVT (supraventricular tachycardia) (HCC)     Past Surgical History:  Procedure Laterality Date   BIOPSY  05/19/2019   Procedure: BIOPSY;  Surgeon: Carman Ching, MD;  [facility];  Service: Endoscopy;;   CARDIOVERSION N/A 04/27/2023   Procedure: CARDIOVERSION;  Surgeon: Debbe Odea, MD;  Location: [facility];  Service: Cardiovascular;  Laterality: N/A;   CARDIOVERSION N/A 06/26/2023   Procedure: CARDIOVERSION;  Surgeon: Debbe Odea, MD;  Location: [facility];  Service: Cardiovascular;  Laterality: N/A;   CATARACT EXTRACTION W/PHACO Right 07/27/2023   Procedure: CATARACT EXTRACTION PHACO AND INTRAOCULAR LENS PLACEMENT (IOC) RIGHT  3.83  00:33.9;  Surgeon: Nevada Crane, MD;  Location: [facility];  Service: Ophthalmology;  Laterality: Right;   CATARACT EXTRACTION W/PHACO Left 08/10/2023   Procedure: CATARACT EXTRACTION PHACO AND INTRAOCULAR LENS PLACEMENT (IOC) LEFT;  Surgeon: Nevada Crane, MD;  Location: [facility];  Service: Ophthalmology;  Laterality: Left;  3.48 0:31.0   COLONOSCOPY WITH PROPOFOL N/A 05/19/2019   Procedure: COLONOSCOPY WITH PROPOFOL;  Surgeon: Carman Ching, MD;  [facility]: [facility];  Service: Endoscopy;  Laterality: N/A;   ESOPHAGOGASTRODUODENOSCOPY (EGD) WITH PROPOFOL N/A 05/19/2019   Procedure: ESOPHAGOGASTRODUODENOSCOPY (EGD) WITH PROPOFOL;  Surgeon: Carman Ching, MD;  Location: [facility];  Service: Endoscopy;  Laterality: N/A;   FOLEY CATHETER     INGUINAL HERNIA  REPAIR  1945   INGUINAL HERNIA REPAIR Left 12/04/2015   Procedure: HERNIA REPAIR INGUINAL ADULT;  Surgeon: Nadeen Landau, MD;  Location: [facility];  Service: General;  Laterality: Left;   LUMBAR FUSION     L3-4 fusion   pilonidal cyst removal  1969   POLYPECTOMY  05/19/2019   Procedure: POLYPECTOMY;  Surgeon: Carman Ching, MD;  [facility]: [facility];  Service: Endoscopy;;   RETROPUBIC PROSTATECTOMY     radical (1999)    TONSILLECTOMY AND ADENOIDECTOMY  1948    Current Meds  Medication Sig   amiodarone (PACERONE) 200 MG tablet Take 1 tablet (200 mg total) by mouth 2 (two) times daily for 14 days, THEN 1 tablet (200 mg total) daily.   apixaban (ELIQUIS) 5 MG TABS tablet Take 1 tablet (5 mg total) by mouth 2 (two) times daily.   diltiazem (CARDIZEM CD) 180 MG 24 hr capsule Take 1 capsule (180 mg total) by mouth daily.   lisinopril (ZESTRIL) 20 MG tablet Take 1 tablet (20 mg total) by mouth daily.   Multiple Vitamins-Minerals (PRESERVISION AREDS PO) Take 2 tablets by mouth daily.   Omega-3 Fatty Acids (FISH OIL) 1200 MG CAPS Take 1,200 mg by mouth 2 (two) times daily.   pantoprazole (PROTONIX) 40 MG tablet Take 1 tablet (40 mg total) by mouth daily.   Probiotic Product (ALIGN) 4 MG CAPS Take 4 mg by mouth every evening.   rosuvastatin (CRESTOR) 10 MG tablet Take 1 tablet (10 mg total) by mouth daily.   triamcinolone (NASACORT) 55 MCG/ACT AERO nasal inhaler Place 2 sprays into the nose at bedtime.    No Known Allergies    Review of Systems negative except from HPI and PMH  Physical Exam BP 134/82   Pulse 60   Ht 6\' 4"  (1.93 m)   Wt 240 lb (108.9 kg)   SpO2 96%   BMI 29.21 kg/m  Well developed and nourished in no acute distress HENT normal Neck supple with JVP-  flat  Clear Irregularly Irregular rate and rhythm  controlled  ventricular response Abd-soft with active BS No Clubbing cyanosis edema Skin-warm and dry A & Oriented  Grossly normal sensory and motor function  ECG atrial flutter variable block     Assessment and  Plan SVT   Bradycardia-nocturnal  Syncope  Hypertension  PVCs  Anxiety    No recurrent syncope.  No recurrent palpitations despite recurrences of atrial fibrillation/flutter, the latter likely a consequence of his amiodarone therapy.  Given the failure of amiodarone overall, we will discontinue it.  We have discussed catheter ablation and he has seen Dr. Lalla Brothers in  the past for consideration of ablation of SVT.  Will arrange repeat consultation for consideration of ablation for his atrial fibrillation not withstanding the minimal symptoms given EAST AF-Net study  Blood pressure is well-controlled.  We reviewed that with the discontinuation of his amiodarone there may be issues with rate control; however, the preamiodarone ECGs in August had controlled ventricular response.  For now we will continue him on diltiazem and apixaban    Current medicines are reviewed at length with the patient today .  The patient does not  have concerns regarding medicines.

## 2023-08-17 NOTE — Patient Instructions (Signed)
Medication Instructions:  Your physician has recommended you make the following change in your medication:   ** Stop Amiodarone  *If you need a refill on your cardiac medications before your next appointment, please call your pharmacy*   Lab Work: None ordered.  If you have labs (blood work) drawn today and your tests are completely normal, you will receive your results only by: MyChart Message (if you have MyChart) OR A paper copy in the mail If you have any lab test that is abnormal or we need to change your treatment, we will call you to review the results.   Testing/Procedures: None ordered.    Follow-Up: At [facility], you and your health needs are our priority.  As part of our continuing mission to provide you with exceptional heart care, we have created designated Provider Care Teams.  These Care Teams include your primary Cardiologist (physician) and Advanced Practice Providers (APPs -  Physician Assistants and Nurse Practitioners) who all work together to provide you with the care you need, when you need it.  We recommend signing up for the patient portal called "MyChart".  Sign up information is provided on this After Visit Summary.  MyChart is used to connect with patients for Virtual Visits (Telemedicine).  Patients are able to view lab/test results, encounter notes, upcoming appointments, etc.  Non-urgent messages can be sent to your provider as well.   To learn more about what you can do with MyChart, go to [email or web address].    Your next appointment:    **Office Visit with Dr Lalla Brothers   ** 6 months with Dr Graciela Husbands

## 2023-08-18 ENCOUNTER — Ambulatory Visit: Payer: Medicare PPO | Admitting: Cardiology

## 2023-09-04 DIAGNOSIS — Z961 Presence of intraocular lens: Secondary | ICD-10-CM | POA: Diagnosis not present

## 2023-09-04 DIAGNOSIS — Z0100 Encounter for examination of eyes and vision without abnormal findings: Secondary | ICD-10-CM | POA: Diagnosis not present

## 2023-10-14 ENCOUNTER — Ambulatory Visit: Payer: Medicare PPO | Admitting: Cardiology

## 2023-10-15 DIAGNOSIS — M4802 Spinal stenosis, cervical region: Secondary | ICD-10-CM | POA: Diagnosis not present

## 2023-10-15 DIAGNOSIS — Z981 Arthrodesis status: Secondary | ICD-10-CM | POA: Diagnosis not present

## 2023-10-15 DIAGNOSIS — M51362 Other intervertebral disc degeneration, lumbar region with discogenic back pain and lower extremity pain: Secondary | ICD-10-CM | POA: Diagnosis not present

## 2023-10-21 ENCOUNTER — Ambulatory Visit: Payer: Medicare PPO | Admitting: Cardiology

## 2023-10-29 DIAGNOSIS — Z981 Arthrodesis status: Secondary | ICD-10-CM | POA: Diagnosis not present

## 2023-10-29 DIAGNOSIS — M5136 Other intervertebral disc degeneration, lumbar region with discogenic back pain only: Secondary | ICD-10-CM | POA: Diagnosis not present

## 2023-10-29 DIAGNOSIS — M48061 Spinal stenosis, lumbar region without neurogenic claudication: Secondary | ICD-10-CM | POA: Diagnosis not present

## 2023-10-29 DIAGNOSIS — R1031 Right lower quadrant pain: Secondary | ICD-10-CM | POA: Diagnosis not present

## 2023-10-29 DIAGNOSIS — M545 Low back pain, unspecified: Secondary | ICD-10-CM | POA: Diagnosis not present

## 2023-11-02 ENCOUNTER — Other Ambulatory Visit: Payer: Self-pay | Admitting: Internal Medicine

## 2023-11-10 DIAGNOSIS — M48062 Spinal stenosis, lumbar region with neurogenic claudication: Secondary | ICD-10-CM | POA: Diagnosis not present

## 2023-11-10 DIAGNOSIS — Z981 Arthrodesis status: Secondary | ICD-10-CM | POA: Diagnosis not present

## 2023-11-13 ENCOUNTER — Telehealth: Payer: Self-pay | Admitting: Internal Medicine

## 2023-11-13 DIAGNOSIS — Z8546 Personal history of malignant neoplasm of prostate: Secondary | ICD-10-CM

## 2023-11-13 DIAGNOSIS — I10 Essential (primary) hypertension: Secondary | ICD-10-CM

## 2023-11-13 DIAGNOSIS — I480 Paroxysmal atrial fibrillation: Secondary | ICD-10-CM

## 2023-11-13 DIAGNOSIS — R739 Hyperglycemia, unspecified: Secondary | ICD-10-CM

## 2023-11-13 DIAGNOSIS — E7800 Pure hypercholesterolemia, unspecified: Secondary | ICD-10-CM

## 2023-11-13 NOTE — Telephone Encounter (Signed)
 Patient need lab orders.

## 2023-11-14 NOTE — Telephone Encounter (Signed)
 Orders placed for labs

## 2023-11-14 NOTE — Addendum Note (Signed)
 Addended by: Charm Barges on: 11/14/2023 10:48 AM   Modules accepted: Orders

## 2023-11-17 ENCOUNTER — Other Ambulatory Visit: Payer: Self-pay | Admitting: Internal Medicine

## 2023-11-19 ENCOUNTER — Other Ambulatory Visit (INDEPENDENT_AMBULATORY_CARE_PROVIDER_SITE_OTHER): Payer: Medicare PPO

## 2023-11-19 DIAGNOSIS — E7800 Pure hypercholesterolemia, unspecified: Secondary | ICD-10-CM

## 2023-11-19 DIAGNOSIS — R739 Hyperglycemia, unspecified: Secondary | ICD-10-CM | POA: Diagnosis not present

## 2023-11-19 DIAGNOSIS — Z8546 Personal history of malignant neoplasm of prostate: Secondary | ICD-10-CM

## 2023-11-19 DIAGNOSIS — I480 Paroxysmal atrial fibrillation: Secondary | ICD-10-CM | POA: Diagnosis not present

## 2023-11-19 LAB — LIPID PANEL
Cholesterol: 112 mg/dL (ref 0–200)
HDL: 48.9 mg/dL (ref >39.00)
LDL Cholesterol: 53 mg/dL (ref 0–99)
NonHDL: 63.53
Total CHOL/HDL Ratio: 2
Triglycerides: 53 mg/dL (ref 0.0–149.0)
VLDL: 10.6 mg/dL (ref 0.0–40.0)

## 2023-11-19 LAB — BASIC METABOLIC PANEL
BUN: 19 mg/dL (ref 6–23)
CO2: 28 meq/L (ref 19–32)
Calcium: 9.2 mg/dL (ref 8.4–10.5)
Chloride: 106 meq/L (ref 96–112)
Creatinine, Ser: 0.91 mg/dL (ref 0.40–1.50)
GFR: 79.34 mL/min (ref >60.00)
Glucose, Bld: 89 mg/dL (ref 70–99)
Potassium: 4.2 meq/L (ref 3.5–5.1)
Sodium: 141 meq/L (ref 135–145)

## 2023-11-19 LAB — HEPATIC FUNCTION PANEL
ALT: 23 U/L (ref 0–53)
AST: 19 U/L (ref 0–37)
Albumin: 4.3 g/dL (ref 3.5–5.2)
Alkaline Phosphatase: 59 U/L (ref 39–117)
Bilirubin, Direct: 0.2 mg/dL (ref 0.0–0.3)
Total Bilirubin: 0.8 mg/dL (ref 0.2–1.2)
Total Protein: 6.4 g/dL (ref 6.0–8.3)

## 2023-11-19 LAB — TSH: TSH: 5.19 u[IU]/mL (ref 0.35–5.50)

## 2023-11-19 LAB — HEMOGLOBIN A1C: Hgb A1c MFr Bld: 5.7 % (ref 4.6–6.5)

## 2023-11-19 LAB — PSA, MEDICARE: PSA: 0.22 ng/mL (ref 0.10–4.00)

## 2023-11-20 LAB — T4: T4, Total: 7.9 ug/dL (ref 4.9–10.5)

## 2023-11-23 ENCOUNTER — Ambulatory Visit: Payer: Medicare PPO | Admitting: Internal Medicine

## 2023-11-23 ENCOUNTER — Encounter: Payer: Self-pay | Admitting: Internal Medicine

## 2023-11-23 VITALS — BP 118/70 | HR 79 | Temp 98.2°F | Resp 16 | Ht 76.0 in | Wt 244.0 lb

## 2023-11-23 DIAGNOSIS — Z9109 Other allergy status, other than to drugs and biological substances: Secondary | ICD-10-CM | POA: Diagnosis not present

## 2023-11-23 DIAGNOSIS — E7800 Pure hypercholesterolemia, unspecified: Secondary | ICD-10-CM

## 2023-11-23 DIAGNOSIS — I77819 Aortic ectasia, unspecified site: Secondary | ICD-10-CM | POA: Diagnosis not present

## 2023-11-23 DIAGNOSIS — I480 Paroxysmal atrial fibrillation: Secondary | ICD-10-CM

## 2023-11-23 DIAGNOSIS — I10 Essential (primary) hypertension: Secondary | ICD-10-CM

## 2023-11-23 DIAGNOSIS — R739 Hyperglycemia, unspecified: Secondary | ICD-10-CM | POA: Diagnosis not present

## 2023-11-23 DIAGNOSIS — K219 Gastro-esophageal reflux disease without esophagitis: Secondary | ICD-10-CM | POA: Diagnosis not present

## 2023-11-23 DIAGNOSIS — Z8546 Personal history of malignant neoplasm of prostate: Secondary | ICD-10-CM | POA: Diagnosis not present

## 2023-11-23 MED ORDER — LISINOPRIL 20 MG PO TABS: 20.0000 mg | ORAL_TABLET | Freq: Every day | ORAL | 3 refills | Status: AC

## 2023-11-23 MED ORDER — PANTOPRAZOLE SODIUM 40 MG PO TBEC: 40.0000 mg | DELAYED_RELEASE_TABLET | Freq: Every day | ORAL | 3 refills | Status: AC

## 2023-11-23 NOTE — Assessment & Plan Note (Signed)
 Controlled with saline nasal spray and nasacort nasal spray.

## 2023-11-23 NOTE — Assessment & Plan Note (Signed)
 Continue diltiazem and lisinopril.  Continue current medication.  Follow pressures.  Reviewed outside checks. Most readings 120s/70s. Follow metabolic panel.  No changes in medication.

## 2023-11-23 NOTE — Assessment & Plan Note (Signed)
 Low carb diet and exercise.  Follow met b and a1c.  Lab Results  Component Value Date   HGBA1C 5.7 11/19/2023

## 2023-11-23 NOTE — Assessment & Plan Note (Signed)
 S/p DCCV 8/26 - held sinus rhythm for about a week. On amiodarone. S/p successful DCCV 10/24. Had f/u 07/13/23 - converted to typical aflutter. Recommended increased amiodarone to 200mg  bid x 2 weeks and then back to 200mg  daily. Had f/u with Dr Graciela Husbands 08/17/23 - amiodarone stopped. Recommended referral to Dr Lalla Brothers for consideration of ablation. Has appt scheduled this week to discuss.

## 2023-11-23 NOTE — Progress Notes (Signed)
 Subjective:    Patient ID: Edward James, male    DOB: [birth year], 83 y.o.   MRN: [id number]  Patient here for  Chief Complaint  Patient presents with   Medical Management of Chronic Issues    HPI Here to follow up regarding hypercholesterolemia, afib and hypertension. S/p DCCV 8/26 - held sinus rhythm for about a week. On amiodarone. S/p successful DCCV 10/24. Had f/u 07/13/23 - converted to typical aflutter. Recommended increased amiodarone to 200mg  bid x 2 weeks and then back to 200mg  daily. Had f/u with Dr Graciela Husbands 08/17/23 - amiodarone stopped. Recommended referral to Dr Lalla Brothers for consideration of ablation. Has appt scheduled this week to discuss. He reports he is doing well. Feels good. No chest pain or sob reported. No increased heart rate or palpitations. No abdominal pain or bowel change. Discussed labs.    Past Medical History:  Diagnosis Date   Atrial fibrillation (HCC)    Diverticulosis    Dysrhythmia    GERD (gastroesophageal reflux disease)    Hypercholesterolemia    Hypertension    Left atrial dilation    Mild aortic regurgitation    Mild concentric left ventricular hypertrophy (LVH)    Mild mitral regurgitation by prior echocardiogram    Prostate cancer [facility])    s/p radical retropubic prostatectomy 1999   PVCs (premature ventricular contractions)    Right atrial dilation    Spinal stenosis    SVT (supraventricular tachycardia) (HCC)    Past Surgical History:  Procedure Laterality Date   BIOPSY  05/19/2019   Procedure: BIOPSY;  Surgeon: Carman Ching, MD;  Location: [facility];  Service: Endoscopy;;   CARDIOVERSION N/A 04/27/2023   Procedure: CARDIOVERSION;  Surgeon: Debbe Odea, MD;  Location: [facility] OR[facility];  Service: Cardiovascular;  Laterality: N/A;   CARDIOVERSION N/A 06/26/2023   Procedure: CARDIOVERSION;  Surgeon: Debbe Odea, MD;  Location: [facility];  Service: Cardiovascular;  Laterality: N/A;   CATARACT EXTRACTION W/PHACO Right 07/27/2023    Procedure: CATARACT EXTRACTION PHACO AND INTRAOCULAR LENS PLACEMENT (IOC) RIGHT  3.83  00:33.9;  Surgeon: Nevada Crane, MD;  Location: [facility];  Service: Ophthalmology;  Laterality: Right;   CATARACT EXTRACTION W/PHACO Left 08/10/2023   Procedure: CATARACT EXTRACTION PHACO AND INTRAOCULAR LENS PLACEMENT (IOC) LEFT;  Surgeon: Nevada Crane, MD;  Location: [facility];  Service: Ophthalmology;  Laterality: Left;  3.48 0:31.0   COLONOSCOPY WITH PROPOFOL N/A 05/19/2019   Procedure: COLONOSCOPY WITH PROPOFOL;  Surgeon: Carman Ching, MD;  Location: [facility];  Service: Endoscopy;  Laterality: N/A;   ESOPHAGOGASTRODUODENOSCOPY (EGD) WITH PROPOFOL N/A 05/19/2019   Procedure: ESOPHAGOGASTRODUODENOSCOPY (EGD) WITH PROPOFOL;  Surgeon: Carman Ching, MD;  Location: [facility];  Service: Endoscopy;  Laterality: N/A;   FOLEY CATHETER     INGUINAL HERNIA REPAIR  1945   INGUINAL HERNIA REPAIR Left 12/04/2015   Procedure: HERNIA REPAIR INGUINAL ADULT;  Surgeon: Nadeen Landau, MD;  Location: [facility];  Service: General;  Laterality: Left;   LUMBAR FUSION     L3-4 fusion   pilonidal cyst removal  1969   POLYPECTOMY  05/19/2019   Procedure: POLYPECTOMY;  Surgeon: Carman Ching, MD;  Location: [facility];  Service: Endoscopy;;   RETROPUBIC PROSTATECTOMY     radical (1999)   TONSILLECTOMY AND ADENOIDECTOMY  1948   Family History  Problem Relation Age of Onset   Arthritis Mother    Dementia Mother    AAA (abdominal aortic aneurysm) Father    Nephrolithiasis Other  Colon cancer Neg Hx    Social History   Socioeconomic History   Marital status: Married    Spouse name: Not on file   Number of children: 3   Years of education: Not on file   Highest education level: Not on file  Occupational History   Not on file  Tobacco Use   Smoking status: Former    Current packs/day: 0.00    Types: Cigarettes    Quit date: 1964    Years since quitting: 61.2    Smokeless tobacco: Never   Tobacco comments:    Social smoker for 2 yrs in the [employer]  Vaping Use   Vaping status: Never Used  Substance and Sexual Activity   Alcohol use: Not Currently    Comment: rarely   Drug use: No   Sexual activity: Not on file  Other Topics Concern   Not on file  Social History Narrative   Not on file   Social Drivers of Health   Financial Resource Strain: Low Risk  (11/22/2020)   Overall Financial Resource Strain (CARDIA)    Difficulty of Paying Living Expenses: Not hard at all  Food Insecurity: No Food Insecurity (11/22/2020)   Hunger Vital Sign    Worried About Running Out of Food in the Last Year: Never true    Ran Out of Food in the Last Year: Never true  Transportation Needs: No Transportation Needs (11/22/2020)   PRAPARE - Administrator, Civil Service (Medical): No    Lack of Transportation (Non-Medical): No  Physical Activity: Not on file  Stress: No Stress Concern Present (11/22/2020)   [employer] of Occupational Health - Occupational Stress Questionnaire    Feeling of Stress : Not at all  Social Connections: Unknown (11/22/2020)   Social Connection and Isolation Panel [NHANES]    Frequency of Communication with Friends and Family: Not on file    Frequency of Social Gatherings with Friends and Family: Not on file    Attends Religious Services: Not on file    Active Member of Clubs or Organizations: Not on file    Attends Banker Meetings: Not on file    Marital Status: Married     Review of Systems  Constitutional:  Negative for appetite change and unexpected weight change.  HENT:  Negative for congestion and sinus pressure.   Respiratory:  Negative for cough, chest tightness and shortness of breath.   Cardiovascular:  Negative for chest pain and palpitations.  Gastrointestinal:  Negative for abdominal pain, diarrhea, nausea and vomiting.  Genitourinary:  Negative for difficulty urinating and dysuria.   Musculoskeletal:  Negative for joint swelling and myalgias.  Skin:  Negative for color change and rash.  Neurological:  Negative for dizziness and headaches.  Psychiatric/Behavioral:  Negative for agitation and dysphoric mood.        Objective:     BP 118/70   Pulse 79   Temp 98.2 F (36.8 C)   Resp 16   Ht 6\' 4"  (1.93 m)   Wt 244 lb (110.7 kg)   SpO2 99%   BMI 29.70 kg/m  Wt Readings from Last 3 Encounters:  11/23/23 244 lb (110.7 kg)  08/17/23 240 lb (108.9 kg)  08/10/23 235 lb (106.6 kg)    Physical Exam Vitals reviewed.  Constitutional:      General: He is not in acute distress.    Appearance: Normal appearance. He is well-developed.  HENT:     Head: Normocephalic and  atraumatic.     Right Ear: External ear normal.     Left Ear: External ear normal.     Mouth/Throat:     Pharynx: No oropharyngeal exudate or posterior oropharyngeal erythema.  Eyes:     General: No scleral icterus.       Right eye: No discharge.        Left eye: No discharge.     Conjunctiva/sclera: Conjunctivae normal.  Cardiovascular:     Rate and Rhythm: Normal rate.     Comments: Irregular irregular rhythm.  Pulmonary:     Effort: Pulmonary effort is normal. No respiratory distress.     Breath sounds: Normal breath sounds.  Abdominal:     General: Bowel sounds are normal.     Palpations: Abdomen is soft.     Tenderness: There is no abdominal tenderness.  Musculoskeletal:        General: No swelling or tenderness.     Cervical back: Neck supple. No tenderness.  Lymphadenopathy:     Cervical: No cervical adenopathy.  Skin:    Findings: No erythema or rash.  Neurological:     Mental Status: He is alert.  Psychiatric:        Mood and Affect: Mood normal.        Behavior: Behavior normal.         Outpatient Encounter Medications as of 11/23/2023  Medication Sig   apixaban (ELIQUIS) 5 MG TABS tablet Take 1 tablet (5 mg total) by mouth 2 (two) times daily.   diltiazem (CARDIZEM  CD) 180 MG 24 hr capsule TAKE 1 CAPSULE BY MOUTH DAILY   lisinopril (ZESTRIL) 20 MG tablet Take 1 tablet (20 mg total) by mouth daily.   Multiple Vitamins-Minerals (PRESERVISION AREDS PO) Take 2 tablets by mouth daily.   Omega-3 Fatty Acids (FISH OIL) 1200 MG CAPS Take 1,200 mg by mouth 2 (two) times daily.   pantoprazole (PROTONIX) 40 MG tablet Take 1 tablet (40 mg total) by mouth daily.   Probiotic Product (ALIGN) 4 MG CAPS Take 4 mg by mouth every evening.   rosuvastatin (CRESTOR) 10 MG tablet TAKE 1 TABLET BY MOUTH DAILY   triamcinolone (NASACORT) 55 MCG/ACT AERO nasal inhaler Place 2 sprays into the nose at bedtime.   [DISCONTINUED] lisinopril (ZESTRIL) 20 MG tablet Take 1 tablet (20 mg total) by mouth daily.   [DISCONTINUED] pantoprazole (PROTONIX) 40 MG tablet Take 1 tablet (40 mg total) by mouth daily.   No facility-administered encounter medications on file as of 11/23/2023.     Lab Results  Component Value Date   WBC 6.7 06/17/2023   HGB 14.8 06/17/2023   HCT 45.9 06/17/2023   PLT 243 06/17/2023   GLUCOSE 89 11/19/2023   CHOL 112 11/19/2023   TRIG 53.0 11/19/2023   HDL 48.90 11/19/2023   LDLCALC 53 11/19/2023   ALT 23 11/19/2023   AST 19 11/19/2023   NA 141 11/19/2023   K 4.2 11/19/2023   CL 106 11/19/2023   CREATININE 0.91 11/19/2023   BUN 19 11/19/2023   CO2 28 11/19/2023   TSH 5.19 11/19/2023   PSA 0.22 11/19/2023   HGBA1C 5.7 11/19/2023       Assessment & Plan:  Paroxysmal atrial fibrillation [facility]) Assessment & Plan: S/p DCCV 8/26 - held sinus rhythm for about a week. On amiodarone. S/p successful DCCV 10/24. Had f/u 07/13/23 - converted to typical aflutter. Recommended increased amiodarone to 200mg  bid x 2 weeks and then back to 200mg  daily. Had  f/u with Dr Graciela Husbands 08/17/23 - amiodarone stopped. Recommended referral to Dr Lalla Brothers for consideration of ablation. Has appt scheduled this week to discuss.    Hypercholesterolemia Assessment & Plan: Continue  crestor.  Low cholesterol diet and exercise.  Follow lipid panel and liver function tests.   Orders: -     Lipid panel; Future -     Hepatic function panel; Future -     Basic metabolic panel; Future  Hyperglycemia Assessment & Plan: Low carb diet and exercise.  Follow met b and a1c.  Lab Results  Component Value Date   HGBA1C 5.7 11/19/2023     Orders: -     Hemoglobin A1c; Future  Dilation of aorta [facility]) Assessment & Plan: Cardiology 05/18/23 [facility]) -  dilation of aortic root. 05/2023 TTE with 44mm aortic root. Repeat echo in 1 year. Recommend BP control, goal being < 130/90.  Blood pressure as outlined.    Environmental allergies Assessment & Plan: Controlled with saline nasal spray and nasacort nasal spray.    Gastroesophageal reflux disease, unspecified whether esophagitis present Assessment & Plan: No upper symptoms reported.  On protonix. Will continue protonix.    History of prostate cancer Assessment & Plan: Has been followed by urology.  PSA just checked .22. (Has varied). Will notify Dr Lonna Cobb.    Primary hypertension Assessment & Plan: Continue diltiazem and lisinopril.  Continue current medication.  Follow pressures.  Reviewed outside checks. Most readings 120s/70s. Follow metabolic panel.  No changes in medication.    Other orders -     Lisinopril; Take 1 tablet (20 mg total) by mouth daily.  Dispense: 90 tablet; Refill: 3 -     Pantoprazole Sodium; Take 1 tablet (40 mg total) by mouth daily.  Dispense: 90 tablet; Refill: 3     Dale Glenaire, MD

## 2023-11-23 NOTE — Assessment & Plan Note (Signed)
 Has been followed by urology.  PSA just checked .22. (Has varied). Will notify Dr Lonna Cobb.

## 2023-11-23 NOTE — Assessment & Plan Note (Signed)
 Cardiology 05/18/23 [facility]ssman [facility]) -  dilation of aortic root. 05/2023 TTE with 44mm aortic root. Repeat echo in 1 year. Recommend BP control, goal being < 130/90.  Blood pressure as outlined.

## 2023-11-23 NOTE — Assessment & Plan Note (Signed)
 Continue crestor.  Low cholesterol diet and exercise.  Follow lipid panel and liver function tests.

## 2023-11-23 NOTE — Assessment & Plan Note (Signed)
 No upper symptoms reported.  On protonix. Will continue protonix.

## 2023-11-24 NOTE — Progress Notes (Unsigned)
 Electrophysiology Office Follow up Visit Note:    Date:  11/24/2023   ID:  Edward James, DOB Oct 16, 1940, MRN [id number]  PCP:  Dale Cuyama, MD  [facility] Cardiologist:  Lorine Bears, MD  [facility] Electrophysiologist:  Sherryl Manges, MD    Interval History:     Edward James is a 83 y.o. male who presents for a follow up visit.   The patient is followed by Dr. Graciela Husbands in the outpatient setting and last saw him August 17, 2023.  He has a history of atrial fibrillation and SVT.  His medical history also includes PVCs, prostate cancer, LVH, hypertension, hyperlipidemia.  He was previously on amiodarone for his arrhythmias but experienced recurrent arrhythmias despite treatment.  I last saw the patient in November 2022.  We discussed treatment options for his SVT.  After our discussion in clinic in 2022, the patient elected to proceed with medication therapy alone.  At that time I did think he was a candidate for EP study and ablation.  He is with his wife today in clinic.  He is doing well.  He confirms he felt better when he was in sinus rhythm.  He is interested in proceeding with catheter ablation.  He is on Eliquis still for stroke prophylaxis.  He is tolerating this medication without bleeding issues.       Past medical, surgical, social and family history were reviewed.  ROS:   Please see the history of present illness.    All other systems reviewed and are negative.  EKGs/Labs/Other Studies Reviewed:    The following studies were reviewed today:   May 08, 2023 echo EF 55-60 RV normal Dilated left and right atrium Mild MR Mild AI  August 17, 2023 EKG shows typical appearing atrial flutter with a ventricular of 60 bpm       Physical Exam:    VS:  There were no vitals taken for this visit.    Wt Readings from Last 3 Encounters:  11/23/23 244 lb (110.7 kg)  08/17/23 240 lb (108.9 kg)  08/10/23 235 lb (106.6 kg)     GEN: no  distress CARD: RRR, No MRG RESP: No IWOB. CTAB.      ASSESSMENT:    No diagnosis found. PLAN:    In order of problems listed above:  #Persistent atrial fibrillation #Atrial flutter #SVT The patient continues to have symptomatic atrial fibrillation and flutter.  He also has a history of SVT.  In the past medication therapy alone was pursued by the patient.  He presents today to discuss treatment strategies for his arrhythmias.  I again discussed treatment strategies including antiarrhythmic drug therapy and catheter ablation.  I discussed the catheter ablation procedure in detail including the risks, recovery and likelihood of success.  He wishes to proceed with scheduling.  Ablation strategy includes PVI plus posterior wall plus EP study with possible SVT ablation.  Discussed treatment options today for AF including antiarrhythmic drug therapy and ablation. Discussed risks, recovery and likelihood of success with each treatment strategy. Risk, benefits, and alternatives to EP study and ablation for afib were discussed. These risks include but are not limited to stroke, bleeding, vascular damage, tamponade, perforation, damage to the esophagus, lungs, phrenic nerve and other structures, pulmonary vein stenosis, worsening renal function, coronary vasospasm and death.  Discussed potential need for repeat ablation procedures and antiarrhythmic drugs after an initial ablation. The patient understands these risk and wishes to proceed.  We will therefore proceed with catheter ablation  at the next available time.  Carto, ICE, anesthesia are requested for the procedure.  Will also obtain CT PV protocol prior to the procedure to exclude LAA thrombus and further evaluate atrial anatomy.  Continue Eliquis   Signed, Steffanie Dunn, MD, [facility], [facility] 11/24/2023 10:08 PM    Electrophysiology [facility]

## 2023-11-25 ENCOUNTER — Ambulatory Visit: Payer: Medicare PPO | Attending: Cardiology | Admitting: Cardiology

## 2023-11-25 ENCOUNTER — Encounter: Payer: Self-pay | Admitting: Cardiology

## 2023-11-25 ENCOUNTER — Other Ambulatory Visit: Payer: Self-pay

## 2023-11-25 VITALS — BP 154/76 | HR 92 | Ht 76.0 in | Wt 242.0 lb

## 2023-11-25 DIAGNOSIS — I4892 Unspecified atrial flutter: Secondary | ICD-10-CM

## 2023-11-25 DIAGNOSIS — I471 Supraventricular tachycardia, unspecified: Secondary | ICD-10-CM

## 2023-11-25 DIAGNOSIS — I4819 Other persistent atrial fibrillation: Secondary | ICD-10-CM | POA: Diagnosis not present

## 2023-11-25 NOTE — Patient Instructions (Addendum)
 Medication Instructions:  Your physician recommends that you continue on your current medications as directed. Please refer to the Current Medication list given to you today.  *If you need a refill on your cardiac medications before your next appointment, please call your pharmacy*   Lab Work: BMET and CBC - you can have these drawn anytime after April 19th Please go to [facility] [address] ([facility], [place] [address] You do not need an appointment.  They are open from 8 am- 4:30 pm.  Lunch from 1:00 pm- 2:00 pm You do NOT need to be fasting.  You may also go to one of the following [facility]:  [address] [place], [place] [address] Phone: [phone] Lab hours: Mon-Fri 8 am- 5 pm    Lunch 12 pm- 1 pm  [address] [place],  [place]  [address]  [place] Phone: [phone] Lab hours: 7 am- 4 pm Lunch 12 pm-1 pm   [address] [place],  [place]  [address]  [place] Phone: [phone] Lab hours: Mon-Fri 8 am- 5 pm    Lunch 12 pm- 1 pm   Testing/Procedures: Cardiac CT Your physician has requested that you have cardiac CT. Cardiac computed tomography (CT) is a painless test that uses an x-ray machine to take clear, detailed pictures of your heart. For further information please visit [email or web address]  We will call you to schedule your CT scan. It will be done about three weeks prior to your ablation.  Ablation  Your physician has recommended that you have an ablation. Catheter ablation is a medical procedure used to treat some cardiac arrhythmias (irregular heartbeats). During catheter ablation, a long, thin, flexible tube is put into a blood vessel in your groin (upper thigh), or neck. This tube is called an ablation catheter. It is then guided to your heart through the blood vessel. Radio frequency waves destroy small areas of heart tissue where abnormal heartbeats may cause an arrhythmia to start. You are scheduled for Atrial Fibrillation  Ablation on Monday, May 19 with Dr. Steffanie Dunn.Please arrive at the Main Entrance A at [facility]: [address] [place], [place] [address] at 8:00 AM    Follow-Up: At [facility], you and your health needs are our priority.  As part of our continuing mission to provide you with exceptional heart care, we have created designated Provider Care Teams.  These Care Teams include your primary Cardiologist (physician) and Advanced Practice Providers (APPs -  Physician Assistants and Nurse Practitioners) who all work together to provide you with the care you need, when you need it.   Your next appointment:   We will call you to schedule your follow up appointments

## 2023-12-14 ENCOUNTER — Encounter: Payer: Self-pay | Admitting: Internal Medicine

## 2023-12-14 NOTE — Telephone Encounter (Signed)
 Thank him for letting us  know that the surgery went well. She has been taking gabapentin at night. Can continue. I would not take the oxycodone the same time as the gabapentin. If she does not need to oxycodone - do not have to take. If the oxycodone has tylenol in it - then do not take the tylenol. Let me know if questions or problems.

## 2023-12-28 ENCOUNTER — Ambulatory Visit
Admission: RE | Admit: 2023-12-28 | Discharge: 2023-12-28 | Disposition: A | Discharge status: Home / Self Care | Source: Ambulatory Visit | Attending: Cardiology | Admitting: Cardiology

## 2023-12-28 DIAGNOSIS — I4819 Other persistent atrial fibrillation: Secondary | ICD-10-CM | POA: Diagnosis not present

## 2023-12-28 DIAGNOSIS — I4892 Unspecified atrial flutter: Secondary | ICD-10-CM | POA: Diagnosis not present

## 2023-12-28 DIAGNOSIS — I471 Supraventricular tachycardia, unspecified: Secondary | ICD-10-CM | POA: Insufficient documentation

## 2023-12-28 MED ORDER — SODIUM CHLORIDE 0.9 % IV SOLN: INTRAVENOUS | Status: DC

## 2023-12-28 MED ORDER — IOHEXOL 350 MG/ML SOLN
75.0000 mL | Freq: Once | INTRAVENOUS | Status: AC | PRN
  Administered 2023-12-28: 75 mL via INTRAVENOUS

## 2024-01-04 ENCOUNTER — Encounter: Payer: Self-pay | Admitting: Internal Medicine

## 2024-01-04 ENCOUNTER — Other Ambulatory Visit (INDEPENDENT_AMBULATORY_CARE_PROVIDER_SITE_OTHER)

## 2024-01-04 ENCOUNTER — Other Ambulatory Visit: Payer: Self-pay | Admitting: Internal Medicine

## 2024-01-04 DIAGNOSIS — R739 Hyperglycemia, unspecified: Secondary | ICD-10-CM | POA: Diagnosis not present

## 2024-01-04 DIAGNOSIS — E7800 Pure hypercholesterolemia, unspecified: Secondary | ICD-10-CM | POA: Diagnosis not present

## 2024-01-04 LAB — BASIC METABOLIC PANEL WITH GFR
BUN: 19 mg/dL (ref 6–23)
CO2: 27 meq/L (ref 19–32)
Calcium: 9 mg/dL (ref 8.4–10.5)
Chloride: 108 meq/L (ref 96–112)
Creatinine, Ser: 0.9 mg/dL (ref 0.40–1.50)
GFR: 80.33 mL/min (ref >60.00)
Glucose, Bld: 84 mg/dL (ref 70–99)
Potassium: 3.8 meq/L (ref 3.5–5.1)
Sodium: 142 meq/L (ref 135–145)

## 2024-01-04 LAB — LIPID PANEL
Cholesterol: 117 mg/dL (ref 0–200)
HDL: 42.5 mg/dL (ref >39.00)
LDL Cholesterol: 61 mg/dL (ref 0–99)
NonHDL: 74.34
Total CHOL/HDL Ratio: 3
Triglycerides: 66 mg/dL (ref 0.0–149.0)
VLDL: 13.2 mg/dL (ref 0.0–40.0)

## 2024-01-04 LAB — HEPATIC FUNCTION PANEL
ALT: 24 U/L (ref 0–53)
AST: 20 U/L (ref 0–37)
Albumin: 4.2 g/dL (ref 3.5–5.2)
Alkaline Phosphatase: 57 U/L (ref 39–117)
Bilirubin, Direct: 0.2 mg/dL (ref 0.0–0.3)
Total Bilirubin: 0.8 mg/dL (ref 0.2–1.2)
Total Protein: 6.3 g/dL (ref 6.0–8.3)

## 2024-01-04 LAB — HEMOGLOBIN A1C: Hgb A1c MFr Bld: 5.8 % (ref 4.6–6.5)

## 2024-01-04 NOTE — Progress Notes (Signed)
Order placed for f/u labs.  

## 2024-01-05 ENCOUNTER — Ambulatory Visit (INDEPENDENT_AMBULATORY_CARE_PROVIDER_SITE_OTHER)

## 2024-01-05 DIAGNOSIS — E7800 Pure hypercholesterolemia, unspecified: Secondary | ICD-10-CM

## 2024-01-05 LAB — CBC WITH DIFFERENTIAL/PLATELET
Basophils Absolute: 0 10*3/uL (ref 0.0–0.1)
Basophils Relative: 0.6 % (ref 0.0–3.0)
Eosinophils Absolute: 0.1 10*3/uL (ref 0.0–0.7)
Eosinophils Relative: 1.7 % (ref 0.0–5.0)
HCT: 44.7 % (ref 39.0–52.0)
Hemoglobin: 14.8 g/dL (ref 13.0–17.0)
Lymphocytes Relative: 30.8 % (ref 12.0–46.0)
Lymphs Abs: 2.2 10*3/uL (ref 0.7–4.0)
MCHC: 33 g/dL (ref 30.0–36.0)
MCV: 89.7 fl (ref 78.0–100.0)
Monocytes Absolute: 0.7 10*3/uL (ref 0.1–1.0)
Monocytes Relative: 9.1 % (ref 3.0–12.0)
Neutro Abs: 4.2 10*3/uL (ref 1.4–7.7)
Neutrophils Relative %: 57.8 % (ref 43.0–77.0)
Platelets: 243 10*3/uL (ref 150.0–400.0)
RBC: 4.98 Mil/uL (ref 4.22–5.81)
RDW: 13.9 % (ref 11.5–15.5)
WBC: 7.2 10*3/uL (ref 4.0–10.5)

## 2024-01-11 DIAGNOSIS — D485 Neoplasm of uncertain behavior of skin: Secondary | ICD-10-CM | POA: Diagnosis not present

## 2024-01-11 DIAGNOSIS — L728 Other follicular cysts of the skin and subcutaneous tissue: Secondary | ICD-10-CM | POA: Diagnosis not present

## 2024-01-11 DIAGNOSIS — X32XXXA Exposure to sunlight, initial encounter: Secondary | ICD-10-CM | POA: Diagnosis not present

## 2024-01-11 DIAGNOSIS — D2261 Melanocytic nevi of right upper limb, including shoulder: Secondary | ICD-10-CM | POA: Diagnosis not present

## 2024-01-11 DIAGNOSIS — L570 Actinic keratosis: Secondary | ICD-10-CM | POA: Diagnosis not present

## 2024-01-11 DIAGNOSIS — Z85828 Personal history of other malignant neoplasm of skin: Secondary | ICD-10-CM | POA: Diagnosis not present

## 2024-01-11 DIAGNOSIS — C44319 Basal cell carcinoma of skin of other parts of face: Secondary | ICD-10-CM | POA: Diagnosis not present

## 2024-01-11 DIAGNOSIS — D225 Melanocytic nevi of trunk: Secondary | ICD-10-CM | POA: Diagnosis not present

## 2024-01-11 DIAGNOSIS — D2262 Melanocytic nevi of left upper limb, including shoulder: Secondary | ICD-10-CM | POA: Diagnosis not present

## 2024-01-11 DIAGNOSIS — C44622 Squamous cell carcinoma of skin of right upper limb, including shoulder: Secondary | ICD-10-CM | POA: Diagnosis not present

## 2024-01-12 ENCOUNTER — Telehealth (HOSPITAL_COMMUNITY): Payer: Self-pay

## 2024-01-12 NOTE — Telephone Encounter (Signed)
 Spoke with patient to discuss upcoming procedure.   CT: completed.  Labs: completed.   Any recent signs of acute illness or been started on antibiotics? No Any new medications started? No Any medications to hold? No Any missed doses of blood thinner? No Advised patient to continue taking ANTICOAGULANT: Eliquis  (Apixaban ) twice daily without missing any doses.  Medication instructions:  On the morning of your procedure DO NOT take any medication., including Eliquis  or the procedure may be rescheduled. Nothing to eat or drink after midnight prior to your procedure.  Confirmed patient is scheduled for Atrial Fibrillation Ablation on Monday, May 19 with Dr. Harvie Liner. Instructed patient to arrive at the Main Entrance A at [facility]: [address] [place], [place] [address] and check in at Admitting at 8:00 AM.  Advised of plan to go home the same day and will only stay overnight if medically necessary. You MUST have a responsible adult to drive you home and MUST be with you the first 24 hours after you arrive home or your procedure could be cancelled.  Patient verbalized understanding to all instructions provided and agreed to proceed with procedure.

## 2024-01-15 NOTE — Pre-Procedure Instructions (Signed)
 Attempted to call patient regarding procedure instructions.  Left voicemail on the following items: Arrival time 0800 Nothing to eat or drink after midnight No meds AM of procedure Responsible person to drive you home and stay with you for 24 hrs  Have you missed any doses of anti-coagulant Eliquis- should be taken twice a day, if you have missed any doses please let us know.  Don't take dose morning of procedure.

## 2024-01-18 ENCOUNTER — Other Ambulatory Visit (HOSPITAL_COMMUNITY): Payer: Self-pay

## 2024-01-18 ENCOUNTER — Encounter (HOSPITAL_COMMUNITY)
Admission: RE | Disposition: A | Discharge status: Home / Self Care | Payer: Self-pay | Source: Home / Self Care | Attending: Cardiology

## 2024-01-18 ENCOUNTER — Ambulatory Visit (HOSPITAL_BASED_OUTPATIENT_CLINIC_OR_DEPARTMENT_OTHER): Payer: Self-pay | Admitting: Anesthesiology

## 2024-01-18 ENCOUNTER — Other Ambulatory Visit: Payer: Self-pay

## 2024-01-18 ENCOUNTER — Ambulatory Visit (HOSPITAL_COMMUNITY)
Admission: RE | Admit: 2024-01-18 | Discharge: 2024-01-18 | Disposition: A | Discharge status: Home / Self Care | Attending: Cardiology | Admitting: Cardiology

## 2024-01-18 ENCOUNTER — Encounter (HOSPITAL_COMMUNITY): Payer: Self-pay | Admitting: Cardiology

## 2024-01-18 ENCOUNTER — Ambulatory Visit (HOSPITAL_COMMUNITY): Payer: Self-pay | Admitting: Anesthesiology

## 2024-01-18 DIAGNOSIS — I483 Typical atrial flutter: Secondary | ICD-10-CM

## 2024-01-18 DIAGNOSIS — K219 Gastro-esophageal reflux disease without esophagitis: Secondary | ICD-10-CM | POA: Insufficient documentation

## 2024-01-18 DIAGNOSIS — I10 Essential (primary) hypertension: Secondary | ICD-10-CM | POA: Diagnosis not present

## 2024-01-18 DIAGNOSIS — Z7901 Long term (current) use of anticoagulants: Secondary | ICD-10-CM | POA: Insufficient documentation

## 2024-01-18 DIAGNOSIS — Z87891 Personal history of nicotine dependence: Secondary | ICD-10-CM

## 2024-01-18 DIAGNOSIS — I4891 Unspecified atrial fibrillation: Secondary | ICD-10-CM | POA: Diagnosis not present

## 2024-01-18 DIAGNOSIS — E7800 Pure hypercholesterolemia, unspecified: Secondary | ICD-10-CM

## 2024-01-18 DIAGNOSIS — I4819 Other persistent atrial fibrillation: Secondary | ICD-10-CM | POA: Diagnosis not present

## 2024-01-18 DIAGNOSIS — Z8679 Personal history of other diseases of the circulatory system: Secondary | ICD-10-CM | POA: Diagnosis not present

## 2024-01-18 HISTORY — PX: ATRIAL FIBRILLATION ABLATION: EP1191

## 2024-01-18 LAB — POCT ACTIVATED CLOTTING TIME: Activated Clotting Time: 245 s

## 2024-01-18 SURGERY — ATRIAL FIBRILLATION ABLATION
Anesthesia: General

## 2024-01-18 MED ORDER — ISOPROTERENOL HCL 0.2 MG/ML IJ SOLN
INTRAVENOUS | Status: DC | PRN
  Administered 2024-01-18: 2 ug/min via INTRAVENOUS

## 2024-01-18 MED ORDER — SODIUM CHLORIDE 0.9% FLUSH: 3.0000 mL | INTRAVENOUS | Status: DC | PRN

## 2024-01-18 MED ORDER — ONDANSETRON HCL 4 MG/2ML IJ SOLN: 4.0000 mg | Freq: Four times a day (QID) | INTRAMUSCULAR | Status: DC | PRN

## 2024-01-18 MED ORDER — SODIUM CHLORIDE 0.9% FLUSH: 3.0000 mL | Freq: Two times a day (BID) | INTRAVENOUS | Status: DC

## 2024-01-18 MED ORDER — COLCHICINE 0.6 MG PO TABS
0.6000 mg | ORAL_TABLET | Freq: Two times a day (BID) | ORAL | 0 refills | Status: DC
  Filled 2024-01-18: qty 10, 5d supply, fill #0

## 2024-01-18 MED ORDER — SODIUM CHLORIDE 0.9 % IV SOLN: INTRAVENOUS | Status: DC

## 2024-01-18 MED ORDER — HEPARIN SODIUM (PORCINE) 1000 UNIT/ML IJ SOLN
INTRAMUSCULAR | Status: DC | PRN
Start: 2024-01-18 — End: 2024-01-18
  Administered 2024-01-18: 15000 [IU] via INTRAVENOUS
  Administered 2024-01-18: 6000 [IU] via INTRAVENOUS

## 2024-01-18 MED ORDER — ONDANSETRON HCL 4 MG/2ML IJ SOLN
INTRAMUSCULAR | Status: DC | PRN
  Administered 2024-01-18: 4 mg via INTRAVENOUS

## 2024-01-18 MED ORDER — PROPOFOL 10 MG/ML IV BOLUS
INTRAVENOUS | Status: DC | PRN
  Administered 2024-01-18: 120 mg via INTRAVENOUS

## 2024-01-18 MED ORDER — FENTANYL CITRATE (PF) 250 MCG/5ML IJ SOLN
INTRAMUSCULAR | Status: DC | PRN
  Administered 2024-01-18: 100 ug via INTRAVENOUS

## 2024-01-18 MED ORDER — ACETAMINOPHEN 500 MG PO TABS
1000.0000 mg | ORAL_TABLET | Freq: Once | ORAL | Status: AC
  Administered 2024-01-18: 1000 mg via ORAL

## 2024-01-18 MED ORDER — SUGAMMADEX SODIUM 200 MG/2ML IV SOLN
INTRAVENOUS | Status: DC | PRN
  Administered 2024-01-18: 213.2 mg via INTRAVENOUS

## 2024-01-18 MED ORDER — COLCHICINE 0.6 MG PO TABS
0.6000 mg | ORAL_TABLET | Freq: Two times a day (BID) | ORAL | Status: DC
  Administered 2024-01-18: 0.6 mg via ORAL
  Filled 2024-01-18: qty 1

## 2024-01-18 MED ORDER — ACETAMINOPHEN 325 MG PO TABS: 650.0000 mg | ORAL_TABLET | ORAL | Status: DC | PRN

## 2024-01-18 MED ORDER — ROCURONIUM BROMIDE 10 MG/ML (PF) SYRINGE
PREFILLED_SYRINGE | INTRAVENOUS | Status: DC | PRN
  Administered 2024-01-18: 70 mg via INTRAVENOUS
  Administered 2024-01-18: 20 mg via INTRAVENOUS
  Administered 2024-01-18: 30 mg via INTRAVENOUS
  Administered 2024-01-18: 10 mg via INTRAVENOUS

## 2024-01-18 MED ORDER — PROTAMINE SULFATE 10 MG/ML IV SOLN
INTRAVENOUS | Status: DC | PRN
  Administered 2024-01-18: 35 mg via INTRAVENOUS

## 2024-01-18 MED ORDER — APIXABAN 5 MG PO TABS
5.0000 mg | ORAL_TABLET | Freq: Two times a day (BID) | ORAL | Status: DC
  Administered 2024-01-18: 5 mg via ORAL
  Filled 2024-01-18: qty 1

## 2024-01-18 MED ORDER — ACETAMINOPHEN 500 MG PO TABS
ORAL_TABLET | ORAL | Status: AC
  Filled 2024-01-18: qty 2

## 2024-01-18 MED ORDER — ISOPROTERENOL HCL 0.2 MG/ML IJ SOLN
INTRAMUSCULAR | Status: AC
  Filled 2024-01-18: qty 5

## 2024-01-18 MED ORDER — HEPARIN (PORCINE) IN NACL 1000-0.9 UT/500ML-% IV SOLN
INTRAVENOUS | Status: DC | PRN
  Administered 2024-01-18 (×3): 500 mL

## 2024-01-18 MED ORDER — LIDOCAINE 2% (20 MG/ML) 5 ML SYRINGE
INTRAMUSCULAR | Status: DC | PRN
  Administered 2024-01-18: 20 mg via INTRAVENOUS

## 2024-01-18 MED ORDER — DEXAMETHASONE SODIUM PHOSPHATE 10 MG/ML IJ SOLN
INTRAMUSCULAR | Status: DC | PRN
  Administered 2024-01-18: 5 mg via INTRAVENOUS

## 2024-01-18 MED ORDER — PANTOPRAZOLE SODIUM 40 MG PO TBEC
40.0000 mg | DELAYED_RELEASE_TABLET | Freq: Every day | ORAL | Status: DC
  Administered 2024-01-18: 40 mg via ORAL
  Filled 2024-01-18: qty 1

## 2024-01-18 MED ORDER — HEPARIN SODIUM (PORCINE) 1000 UNIT/ML IJ SOLN
INTRAMUSCULAR | Status: AC
  Filled 2024-01-18: qty 10

## 2024-01-18 MED ORDER — SODIUM CHLORIDE 0.9 % IV SOLN: 250.0000 mL | INTRAVENOUS | Status: DC | PRN

## 2024-01-18 MED ORDER — HEPARIN SODIUM (PORCINE) 1000 UNIT/ML IJ SOLN
INTRAMUSCULAR | Status: DC | PRN
  Administered 2024-01-18: 1000 [IU] via INTRAVENOUS

## 2024-01-18 MED ORDER — PHENYLEPHRINE HCL-NACL 20-0.9 MG/250ML-% IV SOLN
INTRAVENOUS | Status: DC | PRN
  Administered 2024-01-18: 20 ug/min via INTRAVENOUS

## 2024-01-18 MED ORDER — ATROPINE SULFATE 0.4 MG/ML IV SOLN
INTRAVENOUS | Status: DC | PRN
  Administered 2024-01-18: 1 mg via INTRAVENOUS

## 2024-01-18 SURGICAL SUPPLY — 23 items
BAG SNAP BAND KOVER 36X36 (MISCELLANEOUS) IMPLANT
CABLE PFA RX CATH CONN (CABLE) IMPLANT
CATH ABLAT QDOT MICRO BI TC FJ (CATHETERS) IMPLANT
CATH CRD2 QUAD 6FR REP (CATHETERS) IMPLANT
CATH FARAWAVE ABLATION 35 (CATHETERS) IMPLANT
CATH GE 8FR SOUNDSTAR (CATHETERS) IMPLANT
CATH JOSEPH QUAD ALLRED 6F REP (CATHETERS) IMPLANT
CATH OCTARAY 2.0 F 3-3-3-3-3 (CATHETERS) IMPLANT
CATH WEB BI DIR CSDF CRV REPRO (CATHETERS) IMPLANT
CLOSURE PERCLOSE PROSTYLE (VASCULAR PRODUCTS) IMPLANT
COVER SWIFTLINK CONNECTOR (BAG) ×1 IMPLANT
DILATOR VESSEL 38 20CM 16FR (INTRODUCER) IMPLANT
GUIDEWIRE INQWIRE 1.5J.035X260 (WIRE) IMPLANT
KIT VERSACROSS CNCT FARADRIVE (KITS) IMPLANT
MAT PREVALON FULL STRYKER (MISCELLANEOUS) IMPLANT
PACK EP LF (CUSTOM PROCEDURE TRAY) ×1 IMPLANT
PAD DEFIB RADIO PHYSIO CONN (PAD) ×1 IMPLANT
PATCH CARTO3 (PAD) IMPLANT
SHEATH AVANTI 11CM 9FR (SHEATH) IMPLANT
SHEATH FARADRIVE STEERABLE (SHEATH) IMPLANT
SHEATH PINNACLE 8F 10CM (SHEATH) IMPLANT
SHEATH PROBE COVER 6X72 (BAG) IMPLANT
TUBING SMART ABLATE COOLFLOW (TUBING) IMPLANT

## 2024-01-18 NOTE — Anesthesia Procedure Notes (Addendum)
 Procedure Name: Intubation Date/Time: 01/18/2024 10:11 AM  Performed by: Alfornia Anis, CRNAPre-anesthesia Checklist: Patient identified, Emergency Drugs available, Suction available and Patient being monitored Patient Re-evaluated:Patient Re-evaluated prior to induction Oxygen Delivery Method: Circle System Utilized Preoxygenation: Pre-oxygenation with 100% oxygen Induction Type: IV induction Ventilation: Mask ventilation without difficulty and Oral airway inserted - appropriate to patient size Laryngoscope Size: Mac and 4 Grade View: Grade I Tube type: Oral Number of attempts: 1 Airway Equipment and Method: Stylet Placement Confirmation: ETT inserted through vocal cords under direct vision, positive ETCO2 and breath sounds checked- equal and bilateral Secured at: 23 cm Tube secured with: Tape Dental Injury: Teeth and Oropharynx as per pre-operative assessment  Comments: SRNA supervised by CRNA and MDA.

## 2024-01-18 NOTE — Anesthesia Postprocedure Evaluation (Signed)
 Anesthesia Post Note  Patient: Edward James  Procedure(s) Performed: ATRIAL FIBRILLATION ABLATION     Patient location during evaluation: Specials Recovery Anesthesia Type: General Level of consciousness: awake and alert, patient cooperative and oriented Pain management: pain level controlled Vital Signs Assessment: post-procedure vital signs reviewed and stable Respiratory status: spontaneous breathing, nonlabored ventilation and respiratory function stable Cardiovascular status: blood pressure returned to baseline and stable Postop Assessment: no apparent nausea or vomiting and adequate PO intake Anesthetic complications: no   No notable events documented.  Last Vitals:  Vitals:   01/18/24 1250 01/18/24 1255  BP: 108/80 116/81  Pulse: 84 84  Resp: 14 17  Temp: 36.5 C   SpO2: 96% 96%    Last Pain:  Vitals:   01/18/24 1309  TempSrc:   PainSc: 0-No pain                 Silvestre Mines,E. Obi Scrima

## 2024-01-18 NOTE — Anesthesia Preprocedure Evaluation (Addendum)
 Anesthesia Evaluation  Patient identified by MRN, date of birth, ID band Patient awake    Reviewed: Allergy & Precautions, NPO status , Patient's Chart, lab work & pertinent test results  History of Anesthesia Complications Negative for: history of anesthetic complications  Airway Mallampati: II  TM Distance: >3 FB Neck ROM: Full    Dental  (+) Dental Advisory Given   Pulmonary former smoker   breath sounds clear to auscultation       Cardiovascular hypertension, Pt. on medications (-) angina + dysrhythmias Atrial Fibrillation and Supra Ventricular Tachycardia  Rhythm:Irregular Rate:Normal  '24 ECHO: EF 55 to 60%. 1. LV EF 55.3 %. The left ventricle has normal function, no regional wall motion abnormalities. There is mild LVH.   2. RVF is normal. The right ventricular size is normal.   3. Left atrial size was moderately dilated.   4. Right atrial size was mild to moderately dilated.   5. The mitral valve is normal in structure. Mild mitral valve regurgitation.   6. The aortic valve is tricuspid. Aortic valve regurgitation is mild.     Neuro/Psych negative neurological ROS     GI/Hepatic Neg liver ROS,GERD  Medicated and Controlled,,  Endo/Other  negative endocrine ROS    Renal/GU negative Renal ROS     Musculoskeletal   Abdominal   Peds  Hematology eliquis    Anesthesia Other Findings Prostate cancer  Reproductive/Obstetrics                             Anesthesia Physical Anesthesia Plan  ASA: 3  Anesthesia Plan: General   Post-op Pain Management: Tylenol  PO (pre-op)*   Induction: Intravenous  PONV Risk Score and Plan: 2 and Ondansetron  and Dexamethasone   Airway Management Planned: Oral ETT  Additional Equipment: None  Intra-op Plan:   Post-operative Plan: Extubation in OR  Informed Consent: I have reviewed the patients History and Physical, chart, labs and discussed the  procedure including the risks, benefits and alternatives for the proposed anesthesia with the patient or authorized representative who has indicated his/her understanding and acceptance.     Dental advisory given  Plan Discussed with: CRNA and Surgeon  Anesthesia Plan Comments:        Anesthesia Quick Evaluation

## 2024-01-18 NOTE — Discharge Instructions (Signed)

## 2024-01-18 NOTE — Transfer of Care (Signed)
 Immediate Anesthesia Transfer of Care Note  Patient: Edward James  Procedure(s) Performed: ATRIAL FIBRILLATION ABLATION  Patient Location: PACU  Anesthesia Type:General  Level of Consciousness: drowsy  Airway & Oxygen Therapy: Patient Spontanous Breathing  Post-op Assessment: Report given to RN and Post -op Vital signs reviewed and stable  Post vital signs: Reviewed and stable  Last Vitals:  Vitals Value Taken Time  BP    Temp    Pulse    Resp    SpO2      Last Pain:  Vitals:   01/18/24 0820  TempSrc:   PainSc: 0-No pain         Complications: No notable events documented.

## 2024-01-18 NOTE — H&P (Signed)
 Electrophysiology Office Follow up Visit Note:     Date:  01/18/2024    ID:  Edward James, DOB 05-12-1941, MRN [id number]   PCP:  Dellar Fenton, MD      [facility] Cardiologist:  Antionette Kirks, MD  [facility] Electrophysiologist:  Richardo Chandler, MD      Interval History:       Edward James is a 83 y.o. male who presents for a follow up visit.    The patient is followed by Dr. Rodolfo James in the outpatient setting and last saw him August 17, 2023.  He has a history of atrial fibrillation and SVT.  His medical history also includes PVCs, prostate cancer, LVH, hypertension, hyperlipidemia.  He was previously on amiodarone  for his arrhythmias but experienced recurrent arrhythmias despite treatment.   I last saw the patient in November 2022.  We discussed treatment options for his SVT.  After our discussion in clinic in 2022, the patient elected to proceed with medication therapy alone.  At that time I did think he was a candidate for EP study and ablation.   He is with his wife today in clinic.  He is doing well.  He confirms he felt better when he was in sinus rhythm.  He is interested in proceeding with catheter ablation.  He is on Eliquis  still for stroke prophylaxis.  He is tolerating this medication without bleeding issues.  Presents for AF ablation and EPS today. Procedure reviewed.   Objective Past medical, surgical, social and family history were reviewed.   ROS:   Please see the history of present illness.    All other systems reviewed and are negative.   EKGs/Labs/Other Studies Reviewed:     The following studies were reviewed today:     May 22, 2023 echo EF 55-60 RV normal Dilated left and right atrium Mild MR Mild AI   August 17, 2023 EKG shows typical appearing atrial flutter with a ventricular of 60 bpm         Physical Exam:     VS: 149/90 87 20 98     Wt Readings from Last 3 Encounters:  11/23/23 244 lb (110.7 kg)  08/17/23 240 lb (108.9  kg)  08/10/23 235 lb (106.6 kg)      GEN: no distress CARD: RRR, No MRG RESP: No IWOB. CTAB.     Assessment ASSESSMENT:     No diagnosis found. PLAN:     In order of problems listed above:   #Persistent atrial fibrillation #Atrial flutter #SVT The patient continues to have symptomatic atrial fibrillation and flutter.  He also has a history of SVT.  In the past medication therapy alone was pursued by the patient.  He presents today to discuss treatment strategies for his arrhythmias.  I again discussed treatment strategies including antiarrhythmic drug therapy and catheter ablation.  I discussed the catheter ablation procedure in detail including the risks, recovery and likelihood of success.  He wishes to proceed with scheduling.   Ablation strategy includes PVI plus posterior wall plus EP study with possible SVT ablation.   Discussed treatment options today for AF including antiarrhythmic drug therapy and ablation. Discussed risks, recovery and likelihood of success with each treatment strategy. Risk, benefits, and alternatives to EP study and ablation for afib were discussed. These risks include but are not limited to stroke, bleeding, vascular damage, tamponade, perforation, damage to the esophagus, lungs, phrenic nerve and other structures, pulmonary vein stenosis, worsening renal function, coronary vasospasm and death.  Discussed potential need for repeat ablation procedures and antiarrhythmic drugs after an initial ablation. The patient understands these risk and wishes to proceed.  We will therefore proceed with catheter ablation at the next available time.  Carto, ICE, anesthesia are requested for the procedure.  Will also obtain CT PV protocol prior to the procedure to exclude LAA thrombus and further evaluate atrial anatomy.   Continue Eliquis     Presents for AF ablation and EPS today. Procedure reviewed.   Signed, Harvie Liner, MD, [facility],  [facility] 01/18/2024 Electrophysiology [facility]

## 2024-01-19 ENCOUNTER — Telehealth (HOSPITAL_COMMUNITY): Payer: Self-pay

## 2024-01-19 NOTE — Telephone Encounter (Signed)
 Spoke with patient to complete post procedure follow up call.  Patient reports no complications with groin sites.   Instructions reviewed with patient:  Remove large bandage at puncture site after 24 hours. It is normal to have bruising, tenderness and a pea or marble sized lump/knot at the groin site which can take up to three months to resolve.  Get help right away if you notice sudden swelling at the puncture site.  Check your puncture site every day for signs of infection: fever, redness, swelling, pus drainage, warmth, foul odor or excessive pain. If this occurs, please call the office at [phone], to speak with the nurse. Get help right away if your puncture site is bleeding and the bleeding does not stop after applying firm pressure to the area.  You may continue to have skipped beats/ atrial fibrillation during the first several months after your procedure.  It is very important not to miss any doses of your blood thinner Eliquis . Patient restarted taking this medication on 01/18/24.   You will follow up with the APP on 02/18/24 and follow up with the APP on 05/03/24.   Patient verbalized understanding to all instructions provided.

## 2024-01-26 DIAGNOSIS — C44622 Squamous cell carcinoma of skin of right upper limb, including shoulder: Secondary | ICD-10-CM | POA: Diagnosis not present

## 2024-01-28 ENCOUNTER — Encounter: Payer: Self-pay | Admitting: Emergency Medicine

## 2024-02-18 ENCOUNTER — Ambulatory Visit: Attending: Cardiology | Admitting: Cardiology

## 2024-02-18 ENCOUNTER — Encounter: Payer: Self-pay | Admitting: Cardiology

## 2024-02-18 VITALS — BP 122/80 | HR 62 | Ht 76.0 in | Wt 234.8 lb

## 2024-02-18 DIAGNOSIS — I4819 Other persistent atrial fibrillation: Secondary | ICD-10-CM

## 2024-02-18 DIAGNOSIS — I471 Supraventricular tachycardia, unspecified: Secondary | ICD-10-CM

## 2024-02-18 DIAGNOSIS — D6869 Other thrombophilia: Secondary | ICD-10-CM | POA: Diagnosis not present

## 2024-02-18 NOTE — Patient Instructions (Signed)
 Medication Instructions:  Your physician recommends that you continue on your current medications as directed. Please refer to the Current Medication list given to you today.   *If you need a refill on your cardiac medications before your next appointment, please call your pharmacy*  Lab Work: None ordered at this time  If you have labs (blood work) drawn today and your tests are completely normal, you will receive your results only by: MyChart Message (if you have MyChart) OR A paper copy in the mail If you have any lab test that is abnormal or we need to change your treatment, we will call you to review the results.  Testing/Procedures: None ordered at this time   Follow-Up: At [facility], you and your health needs are our priority.  As part of our continuing mission to provide you with exceptional heart care, our providers are all part of one team.  This team includes your primary Cardiologist (physician) and Advanced Practice Providers or APPs (Physician Assistants and Nurse Practitioners) who all work together to provide you with the care you need, when you need it.  Your next appointment:    Please keep your next appointment with Suzann Riddle, NP on 05/03/2024 at 1330  We recommend signing up for the patient portal called MyChart.  Sign up information is provided on this After Visit Summary.  MyChart is used to connect with patients for Virtual Visits (Telemedicine).  Patients are able to view lab/test results, encounter notes, upcoming appointments, etc.  Non-urgent messages can be sent to your provider as well.   To learn more about what you can do with MyChart, go to [email or web address].

## 2024-02-18 NOTE — Progress Notes (Signed)
 Electrophysiology Clinic Note    Date:  02/18/2024  Patient ID:  Edward James, Edward James, MRN [id number] PCP:  Dellar Fenton, MD  Cardiologist:  Antionette Kirks, MD remotely Electrophysiologist: Dr. Rodolfo Clan > [name], MD    Discussed the use of AI scribe software for clinical note transcription with the patient, who gave verbal consent to proceed.   Patient Profile    Chief Complaint: AF ablation follow-up  History of Present Illness: Edward James is a 83 y.o. male with PMH notable for parox AFib, SVT, PVCs, HTN, prostate cancer, ; seen today for [name], MD for routine electrophysiology follow-up s/p Ablation.  His SVT has been well controlled for many years, AFib newly diagnosed summer 2024. I had trialed amiodarone  without holding sinus. He is now s/p AF ablation w isolation of pulm veins, posterior wall, and CTI for flutter.   On follow-up today, he is having episodes of elevated heart rate into the 120s that last less than an hour and resolve spontaneously.  He has had 4 of these episodes since his ablation 1 month ago.  The first episode started when he was getting ready to attend a family members funeral.  His most recent episode happened earlier today when he was getting ready to come to today's visit, and he is in another episode right now.  He is relatively asymptomatic during these episodes, sits down and glances at his watch to check the time and realizes that his resting heart rate in the 120s. He overall has had significant improvement in his activity tolerance since the ablation. He continues to take Eliquis  twice a day without any concerning bleeding.  Bilateral groin sites are completely healed, no longer bruised or tender.   He denies chest pain, chest pressure, dizziness, lightheadedness, shortness of breath.    Arrhythmia/Device History Amiodarone  - ineffective     ROS:  Please see the history of present illness. All other systems  are reviewed and otherwise negative.    Physical Exam    VS:  BP 122/80 (BP Location: Left Arm, Patient Position: Sitting)   Pulse 62   Ht 6' 4 (1.93 m)   Wt 234 lb 12.8 oz (106.5 kg)   SpO2 99%   BMI 28.58 kg/m  BMI: Body mass index is 28.58 kg/m.  Wt Readings from Last 3 Encounters:  02/18/24 234 lb 12.8 oz (106.5 kg)  01/18/24 235 lb (106.6 kg)  11/25/23 242 lb (109.8 kg)     GEN- The patient is well appearing, alert and oriented x 3 today.   Lungs- Clear to ausculation bilaterally, normal work of breathing.  Heart- Regular rate and rhythm, no murmurs, rubs or gallops Extremities- No peripheral edema, warm, dry    Studies Reviewed   Previous EP, cardiology notes.    EKG is ordered. Personal review of EKG from today shows:    EKG Interpretation Date/Time:  Thursday February 18 2024 13:30:31 EDT Ventricular Rate:  122 PR Interval:    QRS Duration:  98 QT Interval:  332 QTC Calculation: 473 R Axis:   -61  Text Interpretation: Supraventricular tachycardia with occasional and consecutive Premature ventricular complexes Left axis deviation Confirmed by Harlon Kutner [phone]) on 02/18/2024 3:21:19 PM    02/18/2024 at 204 pm EKG - SR with 1st deg PR, rate 62bpm; LAD PR -  TTE, 05/08/2023  1. Left ventricular ejection fraction, by estimation, is 55 to 60%. Left ventricular ejection fraction by 2D MOD biplane is  55.3 %. The left ventricle has normal function. The left ventricle has no regional wall motion abnormalities. There is mild left ventricular hypertrophy. Left ventricular diastolic parameters are indeterminate.   2. Right ventricular systolic function is normal. The right ventricular size is normal.   3. Left atrial size was moderately dilated.   4. Right atrial size was mild to moderately dilated.   5. The mitral valve is normal in structure. Mild mitral valve regurgitation.   6. The aortic valve is tricuspid. Aortic valve regurgitation is mild.   7. Aortic  dilatation noted. There is mild dilatation of the aortic root, measuring 40mm.   8. The inferior vena cava is normal in size with greater than 50% respiratory variability, suggesting right atrial pressure of 3 mmHg.     Assessment and Plan     #) SVT #) persis AFib S/p AF ablation 12/2023 by Dr. Marven Slimmer He has not had any recurrence of atrial fibrillation, atrial flutter since ablation.  It does appear that he is having an increase in asymptomatic SVT episodes Continue to monitor at this time, consider updated zio if episodes continue Continue 180 Dilt daily   #) Hypercoag d/t persis afib CHA2DS2-VASc Score = at least 3 [CHF History: 0, HTN History: 1, Diabetes History: 0, Stroke History: 0, Vascular Disease History: 0, Age Score: 2, Gender Score: 0].  Therefore, the patient's annual risk of stroke is 3.2 %.    Stroke ppx - 5mg  eliquis  bID, appropriately dosed No bleeding concerns   #) dilated aortic root #) HTN Most recent TTE with aortic root at 40mm, will plan to update TTE in ~05/2024        Current medicines are reviewed at length with the patient today.   The patient does not have concerns regarding his medicines.  The following changes were made today:  none  Labs/ tests ordered today include:  Orders Placed This Encounter  Procedures   EKG 12-Lead   EKG 12-Lead     Disposition: Follow up with Dr. Marven Slimmer or EP APP in 2 months    Signed, Lashica Hannay, NP  02/18/24  3:22 PM  Electrophysiology [facility]

## 2024-03-07 NOTE — Telephone Encounter (Signed)
 Called patient regarding MyChart message. Patient stated that for the past few days, he has been experiencing episodes of a rapid heart rate each morning. He reported being asymptomatic otherwise but noted that the episodes can last anywhere from 5 minutes to an hour. Patient stated that the symptoms are usually relieved with rest.  Appointment scheduled for Wednesday, 03/09/2024, with Suzann Riddle, NP, for further evaluation. Patient was advised to report to the ER if symptoms worsen. Patient verbalized understanding.

## 2024-03-08 NOTE — Progress Notes (Unsigned)
 Electrophysiology Clinic Note    Date:  03/09/2024  Patient ID:  Edward James, Edward James Mar 25, 1941, MRN [id number] PCP:  Glendia Shad, MD  Cardiologist:  Deatrice Cage, MD remotely Electrophysiologist: Dr. Fernande > OLE ONEIDA HOLTS, MD   Discussed the use of AI scribe software for clinical note transcription with the patient, who gave verbal consent to proceed.   Patient Profile    Chief Complaint: AF ablation follow-up  History of Present Illness: Edward James is a 83 y.o. male with PMH notable for parox AFib, SVT, PVCs, HTN, prostate cancer, ; seen today for OLE ONEIDA HOLTS, MD for acute visit d/t tachycardia.  His SVT has been well controlled for many years, AFib newly diagnosed summer 2024. He had trialed amiodarone  without holding sinus. He is now s/p AF ablation w isolation of pulm veins, posterior wall, and CTI for flutter on 01/18/2024 by Dr. HOLTS.  I saw him 01/2024 for routine 1 month post-ablation appointment at which time he was having increased, brief SVT episodes since ablation. He messaged clinic earlier this week that he was continuing to have tachycardic episodes and requested to be seen.   On follow-up today, his SVT episodes have considerably increased in frequency.  They are now happening multiple times a day.  He has noticed that they seem to be triggered by physical activities like bending over or climbing stairs.  Episodes tend to resolve quickly with rest but persist longer if he is unable to immediately sit down when the episode starts. He takes his diltiazem  in the morning and has noticed that most of his SVT episodes happen in the mid-to-late morning. He denies chest pain, chest pressure, nausea, vomiting, diarrhea.  He does not consume alcohol, smoke cigarettes or ingest caffeine.   Continues to take Eliquis  twice daily with no bleeding concerns  His wife joins for appointment who I have met previously  Arrhythmia/Device History Amiodarone  -  ineffective     ROS:  Please see the history of present illness. All other systems are reviewed and otherwise negative.    Physical Exam    VS:  BP 134/80 (BP Location: Left Arm, Patient Position: Sitting, Cuff Size: Normal)   Pulse (!) 121   Ht 6' 4 (1.93 m)   Wt 232 lb 6.4 oz (105.4 kg)   SpO2 98%   BMI 28.29 kg/m  BMI: Body mass index is 28.29 kg/m.  HR during physical exam - 75bpm  Wt Readings from Last 3 Encounters:  03/09/24 232 lb 6.4 oz (105.4 kg)  02/18/24 234 lb 12.8 oz (106.5 kg)  01/18/24 235 lb (106.6 kg)     GEN- The patient is well appearing, alert and oriented x 3 today.   Lungs- Clear to ausculation bilaterally, normal work of breathing.  Heart- Regular rate and rhythm, no murmurs, rubs or gallops Extremities- No peripheral edema, warm, dry    Studies Reviewed   Previous EP, cardiology notes.    EKG is ordered. Personal review of EKG from today shows:    EKG Interpretation Date/Time:  Wednesday March 09 2024 13:05:10 EDT Ventricular Rate:  121 PR Interval:    QRS Duration:  100 QT Interval:  348 QTC Calculation: 494 R Axis:   -62  Text Interpretation: Accelerated Junctional rhythm Left axis deviation Confirmed by Berle Fitz [id number] on 03/09/2024 1:09:43 PM    02/18/2024 at 204 pm EKG - SR with 1st deg PR, rate 62bpm; LAD PR -  TTE, 05/08/2023  1. Left  ventricular ejection fraction, by estimation, is 55 to 60%. Left ventricular ejection fraction by 2D MOD biplane is 55.3 %. The left ventricle has normal function. The left ventricle has no regional wall motion abnormalities. There is mild left ventricular hypertrophy. Left ventricular diastolic parameters are indeterminate.   2. Right ventricular systolic function is normal. The right ventricular size is normal.   3. Left atrial size was moderately dilated.   4. Right atrial size was mild to moderately dilated.   5. The mitral valve is normal in structure. Mild mitral valve regurgitation.    6. The aortic valve is tricuspid. Aortic valve regurgitation is mild.   7. Aortic dilatation noted. There is mild dilatation of the aortic root, measuring 40mm.   8. The inferior vena cava is normal in size with greater than 50% respiratory variability, suggesting right atrial pressure of 3 mmHg.     Assessment and Plan     #) SVT #) persis AFib S/p AF ablation 12/2023 by Dr. Cindie He has not had any recurrence of atrial fibrillation, atrial flutter since ablation.  He is having significantly increased SVT episodes.  Will increase diltiazem  to 120 mg twice daily Update 2-week monitor Update BMP, mag, CBC with PCP appointment in 2 weeks   #) Hypercoag d/t persis afib CHA2DS2-VASc Score = at least 3 [CHF History: 0, HTN History: 1, Diabetes History: 0, Stroke History: 0, Vascular Disease History: 0, Age Score: 2, Gender Score: 0].  Therefore, the patient's annual risk of stroke is 3.2 %.    Stroke ppx - 5mg  eliquis  bID, appropriately dosed No bleeding concerns   #) dilated aortic root #) HTN Most recent TTE with aortic root at 40mm, will plan to update TTE in ~05/2024        Current medicines are reviewed at length with the patient today.   The patient does not have concerns regarding his medicines.  The following changes were made today:   Adjust diltiazem  to 120mg   BID  Labs/ tests ordered today include:  Orders Placed This Encounter  Procedures   CBC   Basic metabolic panel with GFR   Magnesium   LONG TERM MONITOR (3-14 DAYS)   EKG 12-Lead     Disposition: Follow up with Dr. Cindie or EP APP in 2 months    Signed, Velera Lansdale, NP  03/09/24  3:52 PM  Electrophysiology [facility]

## 2024-03-09 ENCOUNTER — Ambulatory Visit

## 2024-03-09 ENCOUNTER — Ambulatory Visit: Attending: Cardiology | Admitting: Cardiology

## 2024-03-09 VITALS — BP 134/80 | HR 121 | Ht 76.0 in | Wt 232.4 lb

## 2024-03-09 DIAGNOSIS — I4819 Other persistent atrial fibrillation: Secondary | ICD-10-CM

## 2024-03-09 DIAGNOSIS — D6869 Other thrombophilia: Secondary | ICD-10-CM

## 2024-03-09 DIAGNOSIS — I471 Supraventricular tachycardia, unspecified: Secondary | ICD-10-CM | POA: Diagnosis not present

## 2024-03-09 MED ORDER — DILTIAZEM HCL ER COATED BEADS 120 MG PO CP24: 120.0000 mg | ORAL_CAPSULE | Freq: Two times a day (BID) | ORAL | 3 refills | Status: AC

## 2024-03-09 NOTE — Patient Instructions (Signed)
 Medication Instructions:  Take Cardizem  CD 120 mg twice daily   *If you need a refill on your cardiac medications before your next appointment, please call your pharmacy*  Lab Work: CBC, BMET, MAG in 2 weeks, please have them send us  the lab results.   If you have labs (blood work) drawn today and your tests are completely normal, you will receive your results only by: MyChart Message (if you have MyChart) OR A paper copy in the mail If you have any lab test that is abnormal or we need to change your treatment, we will call you to review the results.  Testing/Procedures: Edward James- Long Term Monitor Instructions  Your physician has requested you wear a ZIO patch monitor for 7 days.  This is a single patch monitor. [employer] supplies one patch monitor per enrollment. Additional stickers are not available. Please do not apply patch if you will be having a Nuclear Stress Test,  Echocardiogram, Cardiac CT, MRI, or Chest Xray during the period you would be wearing the  monitor. The patch cannot be worn during these tests. You cannot remove and re-apply the  ZIO XT patch monitor.  Your ZIO patch monitor will be mailed 3 day USPS to your address on file. It may take 3-5 days  to receive your monitor after you have been enrolled.  Once you have received your monitor, please review the enclosed instructions. Your monitor  has already been registered assigning a specific monitor serial # to you.  Billing and Patient Assistance Program Information  We have supplied [employer] with any of your insurance information on file for billing purposes. [employer] offers a sliding scale Patient Assistance Program for patients that do not have  insurance, or whose insurance does not completely cover the cost of the ZIO monitor.  You must apply for the Patient Assistance Program to qualify for this discounted rate.  To apply, please call [employer] at [phone], select option 4, select option 2, ask to apply for   Patient Assistance Program. Edward James will ask your household income, and how many people  are in your household. They will quote your out-of-pocket cost based on that information.  [employer] will also be able to set up a 25-month, interest-free payment plan if needed.  Applying the monitor   Shave hair from upper left chest.  Hold abrader disc by orange tab. Rub abrader in 40 strokes over the upper left chest as  indicated in your monitor instructions.  Clean area with 4 enclosed alcohol pads. Let dry.  Apply patch as indicated in monitor instructions. Patch will be placed under collarbone on left  side of chest with arrow pointing upward.  Rub patch adhesive wings for 2 minutes. Remove white label marked 1. Remove the white  label marked 2. Rub patch adhesive wings for 2 additional minutes.  While looking in a mirror, press and release button in center of patch. A small green light will  flash 3-4 times. This will be your only indicator that the monitor has been turned on.  Do not shower for the first 24 hours. You may shower after the first 24 hours.  Press the button if you feel a symptom. You will hear a small click. Record Date, Time and  Symptom in the Patient Logbook.  When you are ready to remove the patch, follow instructions on the last 2 pages of Patient  Logbook. Stick patch monitor onto the last page of Patient Logbook.  Place Patient Logbook in the blue  and white box. Use locking tab on box and tape box closed  securely. The blue and white box has prepaid postage on it. Please place it in the mailbox as  soon as possible. Your physician should have your test results approximately 7 days after the  monitor has been mailed back to [facility].  Call [facility] Customer Care at [phone] if you have questions regarding  your ZIO XT patch monitor. Call them immediately if you see an orange light blinking on your  monitor.  If your monitor falls off in less than 4  days, contact our Monitor department at [phone].  If your monitor becomes loose or falls off after 4 days call [employer] at [phone] for  suggestions on securing your monitor   Follow-Up: At [facility], you and your health needs are our priority.  As part of our continuing mission to provide you with exceptional heart care, our providers are all part of one team.  This team includes your primary Cardiologist (physician) and Advanced Practice Providers or APPs (Physician Assistants and Nurse Practitioners) who all work together to provide you with the care you need, when you need it.  Your next appointment:   4-6 week(s)  Provider:   Ole Holts, MD    We recommend signing up for the patient portal called MyChart.  Sign up information is provided on this After Visit Summary.  MyChart is used to connect with patients for Virtual Visits (Telemedicine).  Patients are able to view lab/test results, encounter notes, upcoming appointments, etc.  Non-urgent messages can be sent to your provider as well.   To learn more about what you can do with MyChart, go to [email or web address].

## 2024-03-16 ENCOUNTER — Telehealth: Payer: Self-pay | Admitting: Internal Medicine

## 2024-03-16 DIAGNOSIS — I480 Paroxysmal atrial fibrillation: Secondary | ICD-10-CM

## 2024-03-16 DIAGNOSIS — I471 Supraventricular tachycardia, unspecified: Secondary | ICD-10-CM

## 2024-03-16 NOTE — Telephone Encounter (Signed)
Need lab order

## 2024-03-17 NOTE — Telephone Encounter (Signed)
 He is not due fasting labs at this time.  Just had fasting labs drawn 01/04/24. Cardiology is recommending lab draw and they have ordered met b, cbc and magnesium. If he wants to get labs drawn while he is here for his appt, we can draw them while he is here.  Let me know if a problem.

## 2024-03-17 NOTE — Telephone Encounter (Signed)
 Pt would like to keep lab appt and get labs for cardiology. Labs have been ordered.

## 2024-03-22 DIAGNOSIS — Z961 Presence of intraocular lens: Secondary | ICD-10-CM | POA: Diagnosis not present

## 2024-03-22 DIAGNOSIS — H353132 Nonexudative age-related macular degeneration, bilateral, intermediate dry stage: Secondary | ICD-10-CM | POA: Diagnosis not present

## 2024-03-25 ENCOUNTER — Other Ambulatory Visit (INDEPENDENT_AMBULATORY_CARE_PROVIDER_SITE_OTHER)

## 2024-03-25 DIAGNOSIS — I471 Supraventricular tachycardia, unspecified: Secondary | ICD-10-CM

## 2024-03-25 DIAGNOSIS — D6869 Other thrombophilia: Secondary | ICD-10-CM | POA: Diagnosis not present

## 2024-03-25 DIAGNOSIS — I480 Paroxysmal atrial fibrillation: Secondary | ICD-10-CM | POA: Diagnosis not present

## 2024-03-25 LAB — CBC WITH DIFFERENTIAL/PLATELET
Basophils Absolute: 0 K/uL (ref 0.0–0.1)
Basophils Relative: 0.3 % (ref 0.0–3.0)
Eosinophils Absolute: 0.1 K/uL (ref 0.0–0.7)
Eosinophils Relative: 1 % (ref 0.0–5.0)
HCT: 45.4 % (ref 39.0–52.0)
Hemoglobin: 15.4 g/dL (ref 13.0–17.0)
Lymphocytes Relative: 31.9 % (ref 12.0–46.0)
Lymphs Abs: 2.7 K/uL (ref 0.7–4.0)
MCHC: 33.8 g/dL (ref 30.0–36.0)
MCV: 87.7 fl (ref 78.0–100.0)
Monocytes Absolute: 0.6 K/uL (ref 0.1–1.0)
Monocytes Relative: 6.9 % (ref 3.0–12.0)
Neutro Abs: 5 K/uL (ref 1.4–7.7)
Neutrophils Relative %: 59.9 % (ref 43.0–77.0)
Platelets: 259 K/uL (ref 150.0–400.0)
RBC: 5.18 Mil/uL (ref 4.22–5.81)
RDW: 13.9 % (ref 11.5–15.5)
WBC: 8.4 K/uL (ref 4.0–10.5)

## 2024-03-25 LAB — BASIC METABOLIC PANEL WITH GFR
BUN: 17 mg/dL (ref 6–23)
CO2: 28 meq/L (ref 19–32)
Calcium: 9.5 mg/dL (ref 8.4–10.5)
Chloride: 106 meq/L (ref 96–112)
Creatinine, Ser: 0.85 mg/dL (ref 0.40–1.50)
GFR: 81.6 mL/min (ref >60.00)
Glucose, Bld: 115 mg/dL — ABNORMAL HIGH (ref 70–99)
Potassium: 3.8 meq/L (ref 3.5–5.1)
Sodium: 142 meq/L (ref 135–145)

## 2024-03-25 LAB — MAGNESIUM: Magnesium: 2.1 mg/dL (ref 1.5–2.5)

## 2024-03-26 ENCOUNTER — Ambulatory Visit: Payer: Self-pay | Admitting: Internal Medicine

## 2024-03-28 ENCOUNTER — Telehealth: Payer: Self-pay | Admitting: Cardiology

## 2024-03-28 ENCOUNTER — Ambulatory Visit: Admitting: Internal Medicine

## 2024-03-28 ENCOUNTER — Encounter: Payer: Self-pay | Admitting: Internal Medicine

## 2024-03-28 VITALS — BP 126/70 | HR 80 | Resp 16 | Ht 76.0 in | Wt 231.0 lb

## 2024-03-28 DIAGNOSIS — K219 Gastro-esophageal reflux disease without esophagitis: Secondary | ICD-10-CM | POA: Diagnosis not present

## 2024-03-28 DIAGNOSIS — I10 Essential (primary) hypertension: Secondary | ICD-10-CM

## 2024-03-28 DIAGNOSIS — I480 Paroxysmal atrial fibrillation: Secondary | ICD-10-CM | POA: Diagnosis not present

## 2024-03-28 DIAGNOSIS — R739 Hyperglycemia, unspecified: Secondary | ICD-10-CM

## 2024-03-28 DIAGNOSIS — I77819 Aortic ectasia, unspecified site: Secondary | ICD-10-CM

## 2024-03-28 DIAGNOSIS — Z125 Encounter for screening for malignant neoplasm of prostate: Secondary | ICD-10-CM | POA: Diagnosis not present

## 2024-03-28 DIAGNOSIS — I471 Supraventricular tachycardia, unspecified: Secondary | ICD-10-CM

## 2024-03-28 DIAGNOSIS — E7800 Pure hypercholesterolemia, unspecified: Secondary | ICD-10-CM

## 2024-03-28 DIAGNOSIS — I77810 Thoracic aortic ectasia: Secondary | ICD-10-CM

## 2024-03-28 DIAGNOSIS — Z8546 Personal history of malignant neoplasm of prostate: Secondary | ICD-10-CM | POA: Diagnosis not present

## 2024-03-28 NOTE — Telephone Encounter (Signed)
 Dr LITTIE and Suzann, Echo order dates back to 05/11/23 - do you still want it done?  Pt called back and is wondering why the Echo is being scheduled and I wasn't sure.   Please advise, Edward James

## 2024-03-28 NOTE — Telephone Encounter (Signed)
 Patient wants a call back to confirm he still needs to have an echocardiogram.

## 2024-03-28 NOTE — Progress Notes (Signed)
 Subjective:    Patient ID: Edward James, male    DOB: [birth year], 83 y.o.   MRN: [id number]  Patient here for  Chief Complaint  Patient presents with   Medical Management of Chronic Issues    HPI Here for  a scheduled follow up. Saw cardiology 03/09/24. Is s/p AF ablation - 01/18/24 - Dr Cindie. Noted - increased SVT episodes - multiple times per day. Tend to resolve quickly if can sit down. Cardiology increased his diltiazem  to 120mg  bid. Continues on eliquis . Recommended f/u TTE 05/2024 - f/u dilated aortic root.  Since increasing the diltiazem  to twice a day, he cannot tell a big change in his symptoms.  He continues to have episodes of SVT daily.  If he can sit down immediately these episodes usually only last about 1 to 2 minutes.  The longest episode he has had on 1 occasion was 1 hour.  He has no associated chest pain.  Breathing is stable.  No dizziness or lightheadedness.  No syncope or near syncope.  He is eating.  Does not have as much of an appetite when its hot weather.  He is trying to stay hydrated.  He does report some increased gas.  No abdominal pain.  Bowels are moving daily.  Not ingesting a lot of artificial sweetener.  Have asked him to monitor his diet intake.   Past Medical History:  Diagnosis Date   Atrial fibrillation (HCC)    Diverticulosis    Dysrhythmia    GERD (gastroesophageal reflux disease)    Hypercholesterolemia    Hypertension    Left atrial dilation    Mild aortic regurgitation    Mild concentric left ventricular hypertrophy (LVH)    Mild mitral regurgitation by prior echocardiogram    Prostate cancer [facility])    s/p radical retropubic prostatectomy 1999   PVCs (premature ventricular contractions)    Right atrial dilation    Spinal stenosis    SVT (supraventricular tachycardia) (HCC)    Past Surgical History:  Procedure Laterality Date   ATRIAL FIBRILLATION ABLATION N/A 01/18/2024   Procedure: ATRIAL FIBRILLATION ABLATION;  Surgeon: Cindie Ole DASEN, MD;  Location: [facility];  Service: Cardiovascular;  Laterality: N/A;   BIOPSY  05/19/2019   Procedure: BIOPSY;  Surgeon: Celestia Agent, MD;  Location: [facility];  Service: Endoscopy;;   CARDIOVERSION N/A 04/27/2023   Procedure: CARDIOVERSION;  Surgeon: Darliss Rogue, MD;  Location: [facility] ORS;  Service: Cardiovascular;  Laterality: N/A;   CARDIOVERSION N/A 06/26/2023   Procedure: CARDIOVERSION;  Surgeon: Darliss Rogue, MD;  Location: [facility] ORS;  Service: Cardiovascular;  Laterality: N/A;   CATARACT EXTRACTION W/PHACO Right 07/27/2023   Procedure: CATARACT EXTRACTION PHACO AND INTRAOCULAR LENS PLACEMENT (IOC) RIGHT  3.83  00:33.9;  Surgeon: Myrna Adine Anes, MD;  Location: [facility];  Service: Ophthalmology;  Laterality: Right;   CATARACT EXTRACTION W/PHACO Left 08/10/2023   Procedure: CATARACT EXTRACTION PHACO AND INTRAOCULAR LENS PLACEMENT (IOC) LEFT;  Surgeon: Myrna Adine Anes, MD;  Location: [facility];  Service: Ophthalmology;  Laterality: Left;  3.48 0:31.0   COLONOSCOPY WITH PROPOFOL  N/A 05/19/2019   Procedure: COLONOSCOPY WITH PROPOFOL ;  Surgeon: Celestia Agent, MD;  Location: [facility];  Service: Endoscopy;  Laterality: N/A;   ESOPHAGOGASTRODUODENOSCOPY (EGD) WITH PROPOFOL  N/A 05/19/2019   Procedure: ESOPHAGOGASTRODUODENOSCOPY (EGD) WITH PROPOFOL ;  Surgeon: Celestia Agent, MD;  Location: [facility];  Service: Endoscopy;  Laterality: N/A;   FOLEY CATHETER     INGUINAL HERNIA REPAIR  1945  INGUINAL HERNIA REPAIR Left 12/04/2015   Procedure: HERNIA REPAIR INGUINAL ADULT;  Surgeon: Larinda Unknown Sharps, MD;  Location: [facility] ORS;  Service: General;  Laterality: Left;   LUMBAR FUSION     L3-4 fusion   pilonidal cyst removal  1969   POLYPECTOMY  05/19/2019   Procedure: POLYPECTOMY;  Surgeon: Celestia Agent, MD;  Location: [facility];  Service: Endoscopy;;   RETROPUBIC PROSTATECTOMY     radical (1999)   TONSILLECTOMY AND ADENOIDECTOMY   1948   Family History  Problem Relation Age of Onset   Arthritis Mother    Dementia Mother    AAA (abdominal aortic aneurysm) Father    Nephrolithiasis Other    Colon cancer Neg Hx    Social History   Socioeconomic History   Marital status: Married    Spouse name: Not on file   Number of children: 3   Years of education: Not on file   Highest education level: Not on file  Occupational History   Not on file  Tobacco Use   Smoking status: Former    Current packs/day: 0.00    Types: Cigarettes    Quit date: 1964    Years since quitting: 61.6   Smokeless tobacco: Never   Tobacco comments:    Social smoker for 2 yrs in the [employer]  Vaping Use   Vaping status: Never Used  Substance and Sexual Activity   Alcohol use: Not Currently    Comment: rarely   Drug use: No   Sexual activity: Not on file  Other Topics Concern   Not on file  Social History Narrative   Not on file   Social Drivers of Health   Financial Resource Strain: Low Risk  (11/22/2020)   Overall Financial Resource Strain (CARDIA)    Difficulty of Paying Living Expenses: Not hard at all  Food Insecurity: No Food Insecurity (11/22/2020)   Hunger Vital Sign    Worried About Running Out of Food in the Last Year: Never true    Ran Out of Food in the Last Year: Never true  Transportation Needs: No Transportation Needs (11/22/2020)   PRAPARE - Administrator, Civil Service (Medical): No    Lack of Transportation (Non-Medical): No  Physical Activity: Not on file  Stress: No Stress Concern Present (11/22/2020)   [employer] of Occupational Health - Occupational Stress Questionnaire    Feeling of Stress : Not at all  Social Connections: Unknown (11/22/2020)   Social Connection and Isolation Panel    Frequency of Communication with Friends and Family: Not on file    Frequency of Social Gatherings with Friends and Family: Not on file    Attends Religious Services: Not on file    Active Member of  Clubs or Organizations: Not on file    Attends Banker Meetings: Not on file    Marital Status: Married     Review of Systems  Constitutional:  Negative for appetite change and unexpected weight change.  HENT:  Negative for congestion and sinus pressure.   Respiratory:  Negative for cough, chest tightness and shortness of breath.   Cardiovascular:  Negative for chest pain and leg swelling.       Persistent intermittent episodes of SVT.   Gastrointestinal:  Negative for abdominal pain, nausea and vomiting.       Increased gas. Bowels moving daily.   Genitourinary:  Negative for difficulty urinating and dysuria.  Musculoskeletal:  Negative for joint swelling and myalgias.  Skin:  Negative for color change and rash.  Neurological:  Negative for dizziness, light-headedness and headaches.  Psychiatric/Behavioral:  Negative for agitation and dysphoric mood.        Objective:     BP 126/70   Pulse 80   Resp 16   Ht 6' 4 (1.93 m)   Wt 231 lb (104.8 kg)   SpO2 98%   BMI 28.12 kg/m  Wt Readings from Last 3 Encounters:  03/28/24 231 lb (104.8 kg)  03/09/24 232 lb 6.4 oz (105.4 kg)  02/18/24 234 lb 12.8 oz (106.5 kg)    Physical Exam Constitutional:      General: He is not in acute distress.    Appearance: Normal appearance. He is well-developed.  HENT:     Head: Normocephalic and atraumatic.     Right Ear: External ear normal.     Left Ear: External ear normal.     Mouth/Throat:     Pharynx: No oropharyngeal exudate or posterior oropharyngeal erythema.  Eyes:     General: No scleral icterus.       Right eye: No discharge.        Left eye: No discharge.  Cardiovascular:     Rate and Rhythm: Normal rate and regular rhythm.  Pulmonary:     Effort: Pulmonary effort is normal. No respiratory distress.     Breath sounds: Normal breath sounds.  Abdominal:     General: Bowel sounds are normal.     Palpations: Abdomen is soft.     Tenderness: There is no  abdominal tenderness.  Musculoskeletal:        General: No swelling or tenderness.     Cervical back: Neck supple. No tenderness.  Lymphadenopathy:     Cervical: No cervical adenopathy.  Skin:    Findings: No erythema or rash.  Neurological:     Mental Status: He is alert.  Psychiatric:        Mood and Affect: Mood normal.        Behavior: Behavior normal.         Outpatient Encounter Medications as of 03/28/2024  Medication Sig   apixaban  (ELIQUIS ) 5 MG TABS tablet Take 1 tablet (5 mg total) by mouth 2 (two) times daily.   colchicine  0.6 MG tablet Take 1 tablet (0.6 mg total) by mouth 2 (two) times daily for 5 days.   diltiazem  (CARDIZEM  CD) 120 MG 24 hr capsule Take 1 capsule (120 mg total) by mouth 2 (two) times daily.   lisinopril  (ZESTRIL ) 20 MG tablet Take 1 tablet (20 mg total) by mouth daily.   Multiple Vitamins-Minerals (PRESERVISION AREDS PO) Take 21 capsules by mouth in the morning and at bedtime.   Omega-3 Fatty Acids (FISH OIL PO) Take 1 capsule by mouth daily.   pantoprazole  (PROTONIX ) 40 MG tablet Take 1 tablet (40 mg total) by mouth daily.   Probiotic Product (ALIGN) 4 MG CAPS Take 4 mg by mouth every evening.   rosuvastatin  (CRESTOR ) 10 MG tablet TAKE 1 TABLET BY MOUTH DAILY   triamcinolone  (NASACORT) 55 MCG/ACT AERO nasal inhaler Place 2 sprays into the nose at bedtime.   No facility-administered encounter medications on file as of 03/28/2024.     Lab Results  Component Value Date   WBC 8.4 03/25/2024   HGB 15.4 03/25/2024   HCT 45.4 03/25/2024   PLT 259.0 03/25/2024   GLUCOSE 115 (H) 03/25/2024   CHOL 117 01/04/2024   TRIG 66.0 01/04/2024   HDL 42.50 01/04/2024  LDLCALC 61 01/04/2024   ALT 24 01/04/2024   AST 20 01/04/2024   NA 142 03/25/2024   K 3.8 03/25/2024   CL 106 03/25/2024   CREATININE 0.85 03/25/2024   BUN 17 03/25/2024   CO2 28 03/25/2024   TSH 5.19 11/19/2023   PSA 0.22 11/19/2023   HGBA1C 5.8 01/04/2024    EP STUDY Result Date:  01/18/2024 CONCLUSIONS: 1. Successful PVI 2. Successful ablation/isolation of the posterior wall 3. Successful ablation of typical atrial flutter (cavotricuspid isthmus ablation) 4. No other inducible arrhythmias during EP study with Isuprel  infusion 5. intracardiac echo reveals trivial pericardial effusion, severely dilated left atrium 6. No early apparent complications. 7. Colchicine  0.6mg  PO BID x 5 days 8. Protonix  40mg  PO daily x 45 days       Assessment & Plan:  Prostate cancer screening -     PSA, Medicare; Future  Hypercholesterolemia Assessment & Plan: Continue crestor .  Low cholesterol diet and exercise.  Follow lipid panel.   Orders: -     Lipid panel; Future -     Hepatic function panel; Future -     Basic metabolic panel with GFR; Future  Hyperglycemia Assessment & Plan: Low carb diet and exercise.  Follow met b and A1c.  Lab Results  Component Value Date   HGBA1C 5.8 01/04/2024     Orders: -     Hemoglobin A1c; Future  Paroxysmal atrial fibrillation [facility]) Assessment & Plan: S/p DCCV 8/26 - held sinus rhythm for about a week. On amiodarone . S/p successful DCCV 10/24. Had f/u 07/13/23 - converted to typical aflutter. Recommended increased amiodarone  to 200mg  bid x 2 weeks and then back to 200mg  daily. Had f/u with Dr Fernande 08/17/23 - amiodarone  stopped. Recommended referral to Dr Cindie for consideration of ablation. Saw cardiology 03/09/24. Is s/p AF ablation - 01/18/24 - Dr Cindie. Noted - increased SVT episodes - multiple times per day. Tend to resolve quickly if can sit down. Cardiology increased his diltiazem  to 120mg  bid. Continues on eliquis . Recommended f/u TTE 05/2024 - f/u dilated aortic root.  Since increasing the diltiazem  to twice a day, he cannot tell a big change in his symptoms.  He continues to have episodes of SVT daily.  If he can sit down immediately these episodes usually only last about 1 to 2 minutes.  The longest episode he has had on 1 occasion was 1  hour.  He has no associated chest pain.  Breathing is stable. Has f/u 04/2024 with Dr Cindie and planning f/u ECHO.    Dilation of aorta [facility]) Assessment & Plan: Cardiology 05/18/23 Albino Riddle) -  dilation of aortic root. 05/2023 TTE with 44mm aortic root. Repeat echo in 1 year. Recommend BP control, goal being < 130/90.  Blood pressure as outlined. Scheduled for ECHO 04/2024.    Gastroesophageal reflux disease, unspecified whether esophagitis present Assessment & Plan: No upper symptoms reported.  Continue protonix .   SVT (supraventricular tachycardia) [facility]) Assessment & Plan: Sees Dr Cindie.  Symptoms as outlined. Now on diltiazem  120mg  bid. Has f/u planned with cardiology 04/2024.     Primary hypertension Assessment & Plan: Continue diltiazem  and lisinopril .  Continue current medication.  Follow pressures.  Follow metabolic panel.    History of prostate cancer Assessment & Plan: Was followed by urology.  PSA just checked .22. (Has varied). Recheck psa with next labs.       Allena Hamilton, MD

## 2024-03-29 DIAGNOSIS — I4819 Other persistent atrial fibrillation: Secondary | ICD-10-CM | POA: Diagnosis not present

## 2024-03-29 DIAGNOSIS — I471 Supraventricular tachycardia, unspecified: Secondary | ICD-10-CM | POA: Diagnosis not present

## 2024-03-29 NOTE — Telephone Encounter (Signed)
 Pt called back and rationale of dilated aortic root explained to pt, pt transfer to to [facility] to schedule, pt ok with going to [facility] in order, Old order timed out new Echo ordered

## 2024-03-29 NOTE — Addendum Note (Signed)
 Addended by: HANNAH MARCUS KIDD on: 03/29/2024 08:53 AM   Modules accepted: Orders

## 2024-03-31 ENCOUNTER — Encounter (HOSPITAL_BASED_OUTPATIENT_CLINIC_OR_DEPARTMENT_OTHER): Payer: Self-pay

## 2024-04-02 ENCOUNTER — Encounter: Payer: Self-pay | Admitting: Internal Medicine

## 2024-04-02 NOTE — Assessment & Plan Note (Signed)
 Was followed by urology.  PSA just checked .22. (Has varied). Recheck psa with next labs.

## 2024-04-02 NOTE — Assessment & Plan Note (Signed)
 Continue diltiazem and lisinopril.  Continue current medication.  Follow pressures.  Follow metabolic panel.

## 2024-04-02 NOTE — Assessment & Plan Note (Signed)
 No upper symptoms reported.  Continue protonix.

## 2024-04-02 NOTE — Assessment & Plan Note (Signed)
Continue crestor.  Low cholesterol diet and exercise.  Follow lipid panel.  

## 2024-04-02 NOTE — Assessment & Plan Note (Signed)
 Low carb diet and exercise.  Follow met b and A1c.  Lab Results  Component Value Date   HGBA1C 5.8 01/04/2024

## 2024-04-02 NOTE — Assessment & Plan Note (Signed)
 Cardiology 05/18/23 Albino Riddle) -  dilation of aortic root. 05/2023 TTE with 44mm aortic root. Repeat echo in 1 year. Recommend BP control, goal being < 130/90.  Blood pressure as outlined. Scheduled for ECHO 04/2024.

## 2024-04-02 NOTE — Assessment & Plan Note (Signed)
 S/p DCCV 8/26 - held sinus rhythm for about a week. On amiodarone . S/p successful DCCV 10/24. Had f/u 07/13/23 - converted to typical aflutter. Recommended increased amiodarone  to 200mg  bid x 2 weeks and then back to 200mg  daily. Had f/u with Dr Fernande 08/17/23 - amiodarone  stopped. Recommended referral to Dr Cindie for consideration of ablation. Saw cardiology 03/09/24. Is s/p AF ablation - 01/18/24 - Dr Cindie. Noted - increased SVT episodes - multiple times per day. Tend to resolve quickly if can sit down. Cardiology increased his diltiazem  to 120mg  bid. Continues on eliquis . Recommended f/u TTE 05/2024 - f/u dilated aortic root.  Since increasing the diltiazem  to twice a day, he cannot tell a big change in his symptoms.  He continues to have episodes of SVT daily.  If he can sit down immediately these episodes usually only last about 1 to 2 minutes.  The longest episode he has had on 1 occasion was 1 hour.  He has no associated chest pain.  Breathing is stable. Has f/u 04/2024 with Dr Cindie and planning f/u ECHO.

## 2024-04-02 NOTE — Assessment & Plan Note (Signed)
 Sees Dr Cindie.  Symptoms as outlined. Now on diltiazem  120mg  bid. Has f/u planned with cardiology 04/2024.

## 2024-04-04 DIAGNOSIS — D6869 Other thrombophilia: Secondary | ICD-10-CM

## 2024-04-04 DIAGNOSIS — I471 Supraventricular tachycardia, unspecified: Secondary | ICD-10-CM

## 2024-04-04 DIAGNOSIS — I4819 Other persistent atrial fibrillation: Secondary | ICD-10-CM | POA: Diagnosis not present

## 2024-04-04 NOTE — Telephone Encounter (Signed)
 Called patient to get further information - patient denies any additional S&S except for he just feels off when his heart rate goes up, heart rate spikes occur after taking medications in the morning, brought on by any activity  Please advise

## 2024-04-05 ENCOUNTER — Ambulatory Visit: Payer: Self-pay | Admitting: Cardiology

## 2024-04-11 ENCOUNTER — Other Ambulatory Visit

## 2024-04-18 ENCOUNTER — Other Ambulatory Visit: Payer: Self-pay | Admitting: Cardiology

## 2024-04-18 NOTE — Telephone Encounter (Signed)
 Prescription refill request for Eliquis  received. Indication:afib Last office visit:7/25 Scr:0.85  7/25 Age: 83 Weight:104.8  kg  Prescription refilled

## 2024-04-19 ENCOUNTER — Ambulatory Visit: Admitting: Cardiology

## 2024-04-21 ENCOUNTER — Ambulatory Visit: Attending: Cardiology

## 2024-04-21 DIAGNOSIS — I358 Other nonrheumatic aortic valve disorders: Secondary | ICD-10-CM | POA: Diagnosis not present

## 2024-04-21 DIAGNOSIS — I10 Essential (primary) hypertension: Secondary | ICD-10-CM | POA: Diagnosis not present

## 2024-04-21 DIAGNOSIS — I77810 Thoracic aortic ectasia: Secondary | ICD-10-CM

## 2024-04-21 LAB — ECHOCARDIOGRAM COMPLETE
AR max vel: 1.93 cm2
AV Area VTI: 1.91 cm2
AV Area mean vel: 1.82 cm2
AV Mean grad: 4 mmHg
AV Peak grad: 7.8 mmHg
AV Vena cont: 0.64 cm
Ao pk vel: 1.4 m/s
Area-P 1/2: 3.08 cm2
S' Lateral: 3.3 cm
Single Plane A4C EF: 55.3 %

## 2024-04-22 ENCOUNTER — Ambulatory Visit: Payer: Self-pay | Admitting: Cardiology

## 2024-04-25 NOTE — Telephone Encounter (Signed)
 Reviewed results and recommendations with patient. Confirmed appointment this week with Dr. Cindie and he verbalized understanding with no other questions at this time.

## 2024-04-26 NOTE — Progress Notes (Unsigned)
 Electrophysiology Office Follow up Visit Note:    Date:  04/27/2024   ID:  Edward James, DOB 1941/06/24, MRN [id number]  PCP:  Glendia Shad, MD  [facility] Cardiologist:  Deatrice Cage, MD  [facility] Electrophysiologist:  OLE ONEIDA HOLTS, MD    Interval History:     Edward James is a 83 y.o. male who presents for a follow up visit.   The patient last saw Edward James March 09, 2024.  He has a history of paroxysmal atrial fibrillation, SVT, PVCs, hypertension.  I performed a catheter ablation for his atrial fibrillation on Jan 18, 2024.  During the procedure the pulmonary veins, posterior wall and CTI were ablated.  Episodes of SVT were ongoing and occurred mostly in the late morning.  He takes Eliquis  for stroke prophylaxis.  He is with his wife today in clinic.  He reports the episodes occur predominantly in the morning.  They are usually with exertion.  Once he settles down and sits down and takes a few deep breaths the episodes resolved.  Heart rates are typically in the 100-1 20s.      Past medical, surgical, social and family history were reviewed.  ROS:   Please see the history of present illness.    All other systems reviewed and are negative.  EKGs/Labs/Other Studies Reviewed:    The following studies were reviewed today:  April 04, 2024 ZIO monitor 55 SVT episodes, longest 5 minutes and 51 seconds, average rate 125 bpm        Physical Exam:    VS:  BP 136/80 (BP Location: Left Arm, Patient Position: Sitting, Cuff Size: Large)   Pulse 60   Ht 6' 4 (1.93 m)   Wt 232 lb (105.2 kg)   SpO2 98%   BMI 28.24 kg/m     Wt Readings from Last 3 Encounters:  04/27/24 232 lb (105.2 kg)  03/28/24 231 lb (104.8 kg)  03/09/24 232 lb 6.4 oz (105.4 kg)     GEN: no distress CARD: RRR, No MRG RESP: No IWOB. CTAB.      ASSESSMENT:    1. SVT (supraventricular tachycardia) (HCC)   2. Persistent atrial fibrillation (HCC)   3. Atrial flutter, unspecified  type (HCC)    PLAN:    In order of problems listed above:  #SVT #Atrial flutter #Atrial fibrillation Unclear mechanism.  Differential would include atrial flutter versus AVNRT.  Does not appear consistent with atrial fibrillation.  I discussed treatment options including continued conservative management versus pursuing repeat catheter ablation.  He would like to proceed with catheter ablation  Plan to start procedure under light sedation (fentanyl /Versed ).  Perform EP study.  If SVT induced, target with catheter ablation.  After EP study, plan to induce general anesthesia and checked pulm veins and posterior wall to make sure those are still intact from prior catheter ablation.  Discussed treatment options today for AF including antiarrhythmic drug therapy and ablation. Discussed risks, recovery and likelihood of success with each treatment strategy. Risk, benefits, and alternatives to EP study and ablation for afib were discussed. These risks include but are not limited to stroke, bleeding, vascular damage, tamponade, perforation, damage to the esophagus, lungs, phrenic nerve and other structures, pulmonary vein stenosis, worsening renal function, coronary vasospasm and death.  Discussed potential need for repeat ablation procedures and antiarrhythmic drugs after an initial ablation. The patient understands these risk and wishes to proceed.  We will therefore proceed with catheter ablation at the next available time.  Carto, ICE, anesthesia are requested for the procedure.    No CT scan.  Hold diltiazem  for 5 days prior to the procedure  Continue Eliquis  for stroke prophylaxis   Signed, Ole Holts, MD, [place], [facility] 04/27/2024 11:23 AM    Electrophysiology [facility]

## 2024-04-27 ENCOUNTER — Other Ambulatory Visit: Payer: Self-pay

## 2024-04-27 ENCOUNTER — Ambulatory Visit: Attending: Cardiology | Admitting: Cardiology

## 2024-04-27 VITALS — BP 136/80 | HR 60 | Ht 76.0 in | Wt 232.0 lb

## 2024-04-27 DIAGNOSIS — I471 Supraventricular tachycardia, unspecified: Secondary | ICD-10-CM | POA: Diagnosis not present

## 2024-04-27 DIAGNOSIS — I4819 Other persistent atrial fibrillation: Secondary | ICD-10-CM | POA: Diagnosis not present

## 2024-04-27 DIAGNOSIS — I4892 Unspecified atrial flutter: Secondary | ICD-10-CM | POA: Diagnosis not present

## 2024-04-27 NOTE — Patient Instructions (Addendum)
 Medication Instructions:  Your physician recommends that you continue on your current medications as directed. Please refer to the Current Medication list given to you today.  *If you need a refill on your cardiac medications before your next appointment, please call your pharmacy*  Testing/Procedures: Ablation Your physician has recommended that you have an ablation. Catheter ablation is a medical procedure used to treat some cardiac arrhythmias (irregular heartbeats). During catheter ablation, a long, thin, flexible tube is put into a blood vessel in your groin (upper thigh), or neck. This tube is called an ablation catheter. It is then guided to your heart through the blood vessel. Radio frequency waves destroy small areas of heart tissue where abnormal heartbeats may cause an arrhythmia to start.   You are scheduled for SVT/Atrial Fibrillation Ablation on Monday, October 6th with Dr. Ole Holts. Please arrive at the Main Entrance A at [facility]: [address] [place], [place] [address] at 10:30am   What To Expect:  Labs: you will need to have lab work drawn within 30 days of your procedure. Please go to any [facility] location to have these drawn - no appointment is needed. You will receive procedure instructions either through MyChart or in the mail 4-6 week prior to your procedure.  After your procedure we recommend no driving for 3 days, no lifting over 10 lbs for 5 days, and no work or strenuous activity for 7 days.  Please contact our office at [phone] if you have any questions.    Follow-Up: We will contact you to schedule your post-procedure appointments.

## 2024-05-03 ENCOUNTER — Ambulatory Visit: Admitting: Cardiology

## 2024-05-05 DIAGNOSIS — L814 Other melanin hyperpigmentation: Secondary | ICD-10-CM | POA: Diagnosis not present

## 2024-05-05 DIAGNOSIS — C44319 Basal cell carcinoma of skin of other parts of face: Secondary | ICD-10-CM | POA: Diagnosis not present

## 2024-05-05 DIAGNOSIS — L578 Other skin changes due to chronic exposure to nonionizing radiation: Secondary | ICD-10-CM | POA: Diagnosis not present

## 2024-05-09 ENCOUNTER — Telehealth (HOSPITAL_COMMUNITY): Payer: Self-pay

## 2024-05-09 NOTE — Telephone Encounter (Signed)
 Spoke with patient to complete pre-procedure call.     Health status review:  Any new medical conditions, recent signs of acute illness or been started on antibiotics? No Any recent hospitalizations or surgeries? Yes; Mohs surgery on 9/4 to remove a BCC of the left temple. Patient reports area is healing without complications. Any new medications started since pre-op visit? No  Follow all medication instructions prior to procedure or the procedure may be rescheduled:    Continue taking Eliquis  (Apixaban ) twice daily without missing any doses before procedure. Essential chronic medications:  No medication should be continued, unless told otherwise. On the morning of your procedure DO NOT take any medication., including Eliquis  (Apixaban ).  Nothing to eat or drink after midnight prior to your procedure.  Pre-procedure testing scheduled: lab work on September 19.  Confirmed patient is scheduled for Atrial Fibrillation Ablation/ SVT Ablation on Monday, October 6 with Dr. Ole Holts. Instructed patient to arrive at the Main Entrance A at [facility]: [address] [place], [place] [address] and check in at Admitting at 10:30 AM.  Advised of plan to go home the same day and will only stay overnight if medically necessary. You MUST have a responsible adult to drive you home and MUST be with you the first 24 hours after you arrive home or your procedure could be cancelled.  Informed patient a nurse will call a day before the procedure to confirm arrival time and ensure instructions are followed.  Patient verbalized understanding to information provided and is agreeable to proceed with procedure.   Advised patient to contact RN Navigator at [phone], to inform of any new medications started after call or concerns prior to procedure.

## 2024-05-13 ENCOUNTER — Other Ambulatory Visit

## 2024-05-20 ENCOUNTER — Other Ambulatory Visit (INDEPENDENT_AMBULATORY_CARE_PROVIDER_SITE_OTHER)

## 2024-05-20 DIAGNOSIS — R739 Hyperglycemia, unspecified: Secondary | ICD-10-CM | POA: Diagnosis not present

## 2024-05-20 DIAGNOSIS — I4819 Other persistent atrial fibrillation: Secondary | ICD-10-CM | POA: Diagnosis not present

## 2024-05-20 DIAGNOSIS — Z79899 Other long term (current) drug therapy: Secondary | ICD-10-CM

## 2024-05-20 DIAGNOSIS — E7800 Pure hypercholesterolemia, unspecified: Secondary | ICD-10-CM

## 2024-05-20 DIAGNOSIS — Z125 Encounter for screening for malignant neoplasm of prostate: Secondary | ICD-10-CM

## 2024-05-20 DIAGNOSIS — I480 Paroxysmal atrial fibrillation: Secondary | ICD-10-CM | POA: Diagnosis not present

## 2024-05-20 DIAGNOSIS — D6869 Other thrombophilia: Secondary | ICD-10-CM | POA: Diagnosis not present

## 2024-05-20 LAB — BASIC METABOLIC PANEL WITH GFR
BUN: 16 mg/dL (ref 6–23)
CO2: 29 meq/L (ref 19–32)
Calcium: 9.5 mg/dL (ref 8.4–10.5)
Chloride: 106 meq/L (ref 96–112)
Creatinine, Ser: 0.82 mg/dL (ref 0.40–1.50)
GFR: 82.4 mL/min (ref >60.00)
Glucose, Bld: 93 mg/dL (ref 70–99)
Potassium: 4.3 meq/L (ref 3.5–5.1)
Sodium: 141 meq/L (ref 135–145)

## 2024-05-20 LAB — HEPATIC FUNCTION PANEL
ALT: 20 U/L (ref 0–53)
AST: 20 U/L (ref 0–37)
Albumin: 4.6 g/dL (ref 3.5–5.2)
Alkaline Phosphatase: 71 U/L (ref 39–117)
Bilirubin, Direct: 0.2 mg/dL (ref 0.0–0.3)
Total Bilirubin: 0.8 mg/dL (ref 0.2–1.2)
Total Protein: 6.8 g/dL (ref 6.0–8.3)

## 2024-05-20 LAB — HEMOGLOBIN A1C: Hgb A1c MFr Bld: 6 % (ref 4.6–6.5)

## 2024-05-20 LAB — LIPID PANEL
Cholesterol: 125 mg/dL (ref 0–200)
HDL: 49.4 mg/dL (ref >39.00)
LDL Cholesterol: 60 mg/dL (ref 0–99)
NonHDL: 75.19
Total CHOL/HDL Ratio: 3
Triglycerides: 74 mg/dL (ref 0.0–149.0)
VLDL: 14.8 mg/dL (ref 0.0–40.0)

## 2024-05-20 LAB — PSA, MEDICARE: PSA: 0.1 ng/mL (ref 0.10–4.00)

## 2024-05-21 LAB — HEPATIC FUNCTION PANEL
ALT: 24 IU/L (ref 0–44)
AST: 22 IU/L (ref 0–40)
Albumin: 4.5 g/dL (ref 3.7–4.7)
Alkaline Phosphatase: 79 IU/L (ref 48–129)
Bilirubin Total: 0.7 mg/dL (ref 0.0–1.2)
Bilirubin, Direct: 0.21 mg/dL (ref 0.00–0.40)
Total Protein: 6.8 g/dL (ref 6.0–8.5)

## 2024-05-21 LAB — T4: T4, Total: 7.5 ug/dL (ref 4.5–12.0)

## 2024-05-21 LAB — TSH: TSH: 2.3 u[IU]/mL (ref 0.450–4.500)

## 2024-05-22 ENCOUNTER — Ambulatory Visit: Payer: Self-pay | Admitting: Cardiology

## 2024-05-23 ENCOUNTER — Ambulatory Visit: Payer: Self-pay | Admitting: Internal Medicine

## 2024-05-24 ENCOUNTER — Encounter: Payer: Self-pay | Admitting: Internal Medicine

## 2024-05-24 ENCOUNTER — Ambulatory Visit: Admitting: Internal Medicine

## 2024-05-24 VITALS — BP 132/78 | HR 65 | Temp 97.7°F | Resp 17 | Ht 76.0 in | Wt 232.1 lb

## 2024-05-24 DIAGNOSIS — I77819 Aortic ectasia, unspecified site: Secondary | ICD-10-CM | POA: Diagnosis not present

## 2024-05-24 DIAGNOSIS — K219 Gastro-esophageal reflux disease without esophagitis: Secondary | ICD-10-CM

## 2024-05-24 DIAGNOSIS — I10 Essential (primary) hypertension: Secondary | ICD-10-CM

## 2024-05-24 DIAGNOSIS — E7800 Pure hypercholesterolemia, unspecified: Secondary | ICD-10-CM | POA: Diagnosis not present

## 2024-05-24 DIAGNOSIS — Z8546 Personal history of malignant neoplasm of prostate: Secondary | ICD-10-CM | POA: Diagnosis not present

## 2024-05-24 DIAGNOSIS — I471 Supraventricular tachycardia, unspecified: Secondary | ICD-10-CM | POA: Diagnosis not present

## 2024-05-24 DIAGNOSIS — I480 Paroxysmal atrial fibrillation: Secondary | ICD-10-CM

## 2024-05-24 DIAGNOSIS — Z23 Encounter for immunization: Secondary | ICD-10-CM

## 2024-05-24 DIAGNOSIS — R739 Hyperglycemia, unspecified: Secondary | ICD-10-CM | POA: Diagnosis not present

## 2024-05-24 NOTE — Progress Notes (Signed)
 Subjective:    Patient ID: Edward James, male    DOB: [birth year], 83 y.o.   MRN: [id number]  Patient here for  Chief Complaint  Patient presents with   Medical Management of Chronic Issues    2 mth f/u & review labs    HPI Here for a scheduled follow up - f/u regarding hypertension, afib, hyperglycemia and hypercholesterolemia. Seeing Dr Cindie - last seen 04/27/24 - f/u afib/aflutter - planning for catheter ablation - scheduled 06/06/24. Overall he feels things are relatively stable. Denies chest pain or increased sob. Tries to stay active. No abdominal pain or bowel change.    Past Medical History:  Diagnosis Date   Atrial fibrillation (HCC)    Diverticulosis    Dysrhythmia    GERD (gastroesophageal reflux disease)    Hypercholesterolemia    Hypertension    Left atrial dilation    Mild aortic regurgitation    Mild concentric left ventricular hypertrophy (LVH)    Mild mitral regurgitation by prior echocardiogram    Prostate cancer [facility])    s/p radical retropubic prostatectomy 1999   PVCs (premature ventricular contractions)    Right atrial dilation    Spinal stenosis    SVT (supraventricular tachycardia)    Past Surgical History:  Procedure Laterality Date   ATRIAL FIBRILLATION ABLATION N/A 01/18/2024   Procedure: ATRIAL FIBRILLATION ABLATION;  Surgeon: Cindie Ole DASEN, MD;  [facility]: [facility];  Service: Cardiovascular;  Laterality: N/A;   BIOPSY  05/19/2019   Procedure: BIOPSY;  Surgeon: Celestia Agent, MD;  Location: [facility];  Service: Endoscopy;;   CARDIOVERSION N/A 04/27/2023   Procedure: CARDIOVERSION;  Surgeon: Darliss Rogue, MD;  Location: [facility] ORS;  Service: Cardiovascular;  Laterality: N/A;   CARDIOVERSION N/A 06/26/2023   Procedure: CARDIOVERSION;  Surgeon: Darliss Rogue, MD;  Location: [facility] ORS;  Service: Cardiovascular;  Laterality: N/A;   CATARACT EXTRACTION W/PHACO Right 07/27/2023   Procedure: CATARACT EXTRACTION PHACO AND  INTRAOCULAR LENS PLACEMENT (IOC) RIGHT  3.83  00:33.9;  Surgeon: Myrna Adine Anes, MD;  Location: [facility];  Service: Ophthalmology;  Laterality: Right;   CATARACT EXTRACTION W/PHACO Left 08/10/2023   Procedure: CATARACT EXTRACTION PHACO AND INTRAOCULAR LENS PLACEMENT (IOC) LEFT;  Surgeon: Myrna Adine Anes, MD;  Location: [facility];  Service: Ophthalmology;  Laterality: Left;  3.48 0:31.0   COLONOSCOPY WITH PROPOFOL  N/A 05/19/2019   Procedure: COLONOSCOPY WITH PROPOFOL ;  Surgeon: Celestia Agent, MD;  Location: [facility];  Service: Endoscopy;  Laterality: N/A;   ESOPHAGOGASTRODUODENOSCOPY (EGD) WITH PROPOFOL  N/A 05/19/2019   Procedure: ESOPHAGOGASTRODUODENOSCOPY (EGD) WITH PROPOFOL ;  Surgeon: Celestia Agent, MD;  Location: [facility];  Service: Endoscopy;  Laterality: N/A;   FOLEY CATHETER     INGUINAL HERNIA REPAIR  1945   INGUINAL HERNIA REPAIR Left 12/04/2015   Procedure: HERNIA REPAIR INGUINAL ADULT;  Surgeon: Larinda Unknown Sharps, MD;  Location: [facility] ORS;  Service: General;  Laterality: Left;   LUMBAR FUSION     L3-4 fusion   pilonidal cyst removal  1969   POLYPECTOMY  05/19/2019   Procedure: POLYPECTOMY;  Surgeon: Celestia Agent, MD;  Location: [facility];  Service: Endoscopy;;   RETROPUBIC PROSTATECTOMY     radical (1999)   TONSILLECTOMY AND ADENOIDECTOMY  1948   Family History  Problem Relation Age of Onset   Arthritis Mother    Dementia Mother    AAA (abdominal aortic aneurysm) Father    Nephrolithiasis Other    Colon cancer Neg Hx    Social  History   Socioeconomic History   Marital status: Married    Spouse name: Not on file   Number of children: 3   Years of education: Not on file   Highest education level: Not on file  Occupational History   Not on file  Tobacco Use   Smoking status: Former    Current packs/day: 0.00    Types: Cigarettes    Quit date: 1964    Years since quitting: 61.7   Smokeless tobacco: Never   Tobacco comments:     Social smoker for 2 yrs in the [employer]  Vaping Use   Vaping status: Never Used  Substance and Sexual Activity   Alcohol use: Not Currently    Comment: rarely   Drug use: No   Sexual activity: Not on file  Other Topics Concern   Not on file  Social History Narrative   Not on file   Social Drivers of Health   Financial Resource Strain: Low Risk  (11/22/2020)   Overall Financial Resource Strain (CARDIA)    Difficulty of Paying Living Expenses: Not hard at all  Food Insecurity: No Food Insecurity (11/22/2020)   Hunger Vital Sign    Worried About Running Out of Food in the Last Year: Never true    Ran Out of Food in the Last Year: Never true  Transportation Needs: No Transportation Needs (11/22/2020)   PRAPARE - Administrator, Civil Service (Medical): No    Lack of Transportation (Non-Medical): No  Physical Activity: Not on file  Stress: No Stress Concern Present (11/22/2020)   [employer] of Occupational Health - Occupational Stress Questionnaire    Feeling of Stress : Not at all  Social Connections: Unknown (11/22/2020)   Social Connection and Isolation Panel    Frequency of Communication with Friends and Family: Not on file    Frequency of Social Gatherings with Friends and Family: Not on file    Attends Religious Services: Not on file    Active Member of Clubs or Organizations: Not on file    Attends Banker Meetings: Not on file    Marital Status: Married     Review of Systems  Constitutional:  Negative for appetite change and unexpected weight change.  HENT:  Negative for congestion and sinus pressure.   Respiratory:  Negative for cough, chest tightness and shortness of breath.   Cardiovascular:  Negative for chest pain, palpitations and leg swelling.  Gastrointestinal:  Negative for abdominal pain, diarrhea, nausea and vomiting.  Genitourinary:  Negative for difficulty urinating and dysuria.  Musculoskeletal:  Negative for joint swelling  and myalgias.  Skin:  Negative for color change and rash.  Neurological:  Negative for dizziness and headaches.  Psychiatric/Behavioral:  Negative for agitation and dysphoric mood.        Objective:     BP 132/78 (Cuff Size: Large)   Pulse 65   Temp 97.7 F (36.5 C) (Oral)   Resp 17   Ht 6' 4 (1.93 m)   Wt 232 lb 2 oz (105.3 kg)   SpO2 99%   BMI 28.26 kg/m  Wt Readings from Last 3 Encounters:  05/24/24 232 lb 2 oz (105.3 kg)  04/27/24 232 lb (105.2 kg)  03/28/24 231 lb (104.8 kg)    Physical Exam Vitals reviewed.  Constitutional:      General: He is not in acute distress.    Appearance: Normal appearance. He is well-developed.  HENT:     Head: Normocephalic  and atraumatic.     Right Ear: External ear normal.     Left Ear: External ear normal.     Mouth/Throat:     Pharynx: No oropharyngeal exudate or posterior oropharyngeal erythema.  Eyes:     General: No scleral icterus.       Right eye: No discharge.        Left eye: No discharge.     Conjunctiva/sclera: Conjunctivae normal.  Cardiovascular:     Rate and Rhythm: Normal rate and regular rhythm.  Pulmonary:     Effort: Pulmonary effort is normal. No respiratory distress.     Breath sounds: Normal breath sounds.  Abdominal:     General: Bowel sounds are normal.     Palpations: Abdomen is soft.     Tenderness: There is no abdominal tenderness.  Musculoskeletal:        General: No swelling or tenderness.     Cervical back: Neck supple. No tenderness.  Lymphadenopathy:     Cervical: No cervical adenopathy.  Skin:    Findings: No erythema or rash.  Neurological:     Mental Status: He is alert.  Psychiatric:        Mood and Affect: Mood normal.        Behavior: Behavior normal.         Outpatient Encounter Medications as of 05/24/2024  Medication Sig   diltiazem  (CARDIZEM  CD) 120 MG 24 hr capsule Take 1 capsule (120 mg total) by mouth 2 (two) times daily.   ELIQUIS  5 MG TABS tablet TAKE 1 TABLET BY  MOUTH TWICE A DAY   lisinopril  (ZESTRIL ) 20 MG tablet Take 1 tablet (20 mg total) by mouth daily.   Multiple Vitamins-Minerals (PRESERVISION AREDS PO) Take 21 capsules by mouth in the morning and at bedtime. (Patient taking differently: Take 1 capsule by mouth in the morning and at bedtime.)   Omega-3 Fatty Acids (FISH OIL PO) Take 1 capsule by mouth daily.   pantoprazole  (PROTONIX ) 40 MG tablet Take 1 tablet (40 mg total) by mouth daily.   Probiotic Product (ALIGN) 4 MG CAPS Take 4 mg by mouth every evening.   rosuvastatin  (CRESTOR ) 10 MG tablet TAKE 1 TABLET BY MOUTH DAILY   triamcinolone  (NASACORT) 55 MCG/ACT AERO nasal inhaler Place 2 sprays into the nose at bedtime.   [DISCONTINUED] colchicine  0.6 MG tablet Take 1 tablet (0.6 mg total) by mouth 2 (two) times daily for 5 days.   No facility-administered encounter medications on file as of 05/24/2024.     Lab Results  Component Value Date   WBC 8.4 03/25/2024   HGB 15.4 03/25/2024   HCT 45.4 03/25/2024   PLT 259.0 03/25/2024   GLUCOSE 93 05/20/2024   CHOL 125 05/20/2024   TRIG 74.0 05/20/2024   HDL 49.40 05/20/2024   LDLCALC 60 05/20/2024   ALT 24 05/20/2024   AST 22 05/20/2024   NA 141 05/20/2024   K 4.3 05/20/2024   CL 106 05/20/2024   CREATININE 0.82 05/20/2024   BUN 16 05/20/2024   CO2 29 05/20/2024   TSH 2.300 05/20/2024   PSA 0.10 05/20/2024   HGBA1C 6.0 05/20/2024    EP STUDY Result Date: 01/18/2024 CONCLUSIONS: 1. Successful PVI 2. Successful ablation/isolation of the posterior wall 3. Successful ablation of typical atrial flutter (cavotricuspid isthmus ablation) 4. No other inducible arrhythmias during EP study with Isuprel  infusion 5. intracardiac echo reveals trivial pericardial effusion, severely dilated left atrium 6. No early apparent complications. 7. Colchicine  0.6mg   PO BID x 5 days 8. Protonix  40mg  PO daily x 45 days       Assessment & Plan:  Flu vaccine need -     Flu vaccine HIGH DOSE PF(Fluzone  Trivalent)  Hypercholesterolemia Assessment & Plan: Continue crestor .  Low cholesterol diet and exercise.  Follow lipid panel.   Orders: -     Lipid panel; Future -     Basic metabolic panel with GFR; Future  Hyperglycemia Assessment & Plan: Low carb diet and exercise.  Follow met b and A1c.  Lab Results  Component Value Date   HGBA1C 6.0 05/20/2024     Orders: -     Hemoglobin A1c; Future  SVT (supraventricular tachycardia) Assessment & Plan: Sees Dr Cindie. Now on diltiazem  120mg  bid.   Orders: -     CBC with Differential/Platelet; Future  Paroxysmal atrial fibrillation [facility]) Assessment & Plan: S/p DCCV 8/26 - held sinus rhythm for about a week. On amiodarone . S/p successful DCCV 10/24. Had f/u 07/13/23 - converted to typical aflutter. Recommended increased amiodarone  to 200mg  bid x 2 weeks and then back to 200mg  daily. Had f/u with Dr Fernande 08/17/23 - amiodarone  stopped. Recommended referral to Dr Cindie for consideration of ablation. Saw cardiology 03/09/24. Is s/p AF ablation - 01/18/24 - Dr Cindie. Noted - increased SVT episodes - multiple times per day. Tend to resolve quickly if can sit down. Cardiology increased his diltiazem  to 120mg  bid. Continues on eliquis . Recommended f/u TTE 05/2024 - f/u dilated aortic root.  Since increasing the diltiazem  to twice a day, he cannot tell a big change in his symptoms.  He continues to have episodes of SVT daily.  If he can sit down immediately these episodes usually only last about 1 to 2 minutes.  The longest episode he has had on 1 occasion was 1 hour.  He has no associated chest pain.  Breathing is stable. Seeing Dr Cindie - last seen 04/27/24 - f/u afib/aflutter - planning for catheter ablation - scheduled 06/06/24.   Orders: -     CBC with Differential/Platelet; Future  Dilation of aorta Assessment & Plan: Cardiology 05/18/23 Albino Riddle) -  dilation of aortic root. 05/2023 TTE with 44mm aortic root. Repeat echo in 1 year.  Recommend BP control, goal being < 130/90.  Blood pressure as outlined. ECHO -04/21/24 - mild to moderate MR, TR and AR. GIDD. EF 55-60%. Documented aortic root  - normal in size.    Gastroesophageal reflux disease, unspecified whether esophagitis present Assessment & Plan: Continues on protonix . No upper symptoms.    History of prostate cancer Assessment & Plan: Was followed by urology.  PSA 05/20/24 -  .10.     Primary hypertension Assessment & Plan: Continue diltiazem  and lisinopril .  Continue current medication.  Follow pressures.  Follow metabolic panel. No changes in medication today.       Allena Hamilton, MD

## 2024-05-29 ENCOUNTER — Encounter: Payer: Self-pay | Admitting: Internal Medicine

## 2024-05-29 NOTE — Assessment & Plan Note (Signed)
 Low carb diet and exercise.  Follow met b and A1c.  Lab Results  Component Value Date   HGBA1C 6.0 05/20/2024

## 2024-05-29 NOTE — Assessment & Plan Note (Signed)
 S/p DCCV 8/26 - held sinus rhythm for about a week. On amiodarone . S/p successful DCCV 10/24. Had f/u 07/13/23 - converted to typical aflutter. Recommended increased amiodarone  to 200mg  bid x 2 weeks and then back to 200mg  daily. Had f/u with Dr Fernande 08/17/23 - amiodarone  stopped. Recommended referral to Dr Cindie for consideration of ablation. Saw cardiology 03/09/24. Is s/p AF ablation - 01/18/24 - Dr Cindie. Noted - increased SVT episodes - multiple times per day. Tend to resolve quickly if can sit down. Cardiology increased his diltiazem  to 120mg  bid. Continues on eliquis . Recommended f/u TTE 05/2024 - f/u dilated aortic root.  Since increasing the diltiazem  to twice a day, he cannot tell a big change in his symptoms.  He continues to have episodes of SVT daily.  If he can sit down immediately these episodes usually only last about 1 to 2 minutes.  The longest episode he has had on 1 occasion was 1 hour.  He has no associated chest pain.  Breathing is stable. Seeing Dr Cindie - last seen 04/27/24 - f/u afib/aflutter - planning for catheter ablation - scheduled 06/06/24.

## 2024-05-29 NOTE — Assessment & Plan Note (Signed)
 Sees Dr Cindie. Now on diltiazem  120mg  bid.

## 2024-05-29 NOTE — Assessment & Plan Note (Signed)
 Cardiology 05/18/23 Albino Riddle) -  dilation of aortic root. 05/2023 TTE with 44mm aortic root. Repeat echo in 1 year. Recommend BP control, goal being < 130/90.  Blood pressure as outlined. ECHO -04/21/24 - mild to moderate MR, TR and AR. GIDD. EF 55-60%. Documented aortic root  - normal in size.

## 2024-05-29 NOTE — Assessment & Plan Note (Signed)
 Continue diltiazem  and lisinopril .  Continue current medication.  Follow pressures.  Follow metabolic panel. No changes in medication today.

## 2024-05-29 NOTE — Assessment & Plan Note (Signed)
Continue crestor.  Low cholesterol diet and exercise.  Follow lipid panel.  

## 2024-05-29 NOTE — Assessment & Plan Note (Signed)
 Continues on protonix . No upper symptoms.

## 2024-05-29 NOTE — Assessment & Plan Note (Signed)
 Was followed by urology.  PSA 05/20/24 -  .10.

## 2024-06-05 NOTE — Pre-Procedure Instructions (Signed)
 Instructed patient on the following items: Arrival time 1000 Nothing to eat or drink after midnight No meds AM of procedure Responsible person to drive you home and stay with you for 24 hrs  Have you missed any doses of anti-coagulant Eliquis- takes twice a day, hasn't missed any doses.  Don't take dose morning of procedure.

## 2024-06-06 ENCOUNTER — Encounter (HOSPITAL_COMMUNITY)
Admission: RE | Disposition: A | Discharge status: Home / Self Care | Payer: Self-pay | Source: Home / Self Care | Attending: Cardiology

## 2024-06-06 ENCOUNTER — Other Ambulatory Visit (HOSPITAL_COMMUNITY): Payer: Self-pay

## 2024-06-06 ENCOUNTER — Ambulatory Visit (HOSPITAL_COMMUNITY): Admitting: Anesthesiology

## 2024-06-06 ENCOUNTER — Other Ambulatory Visit: Payer: Self-pay

## 2024-06-06 ENCOUNTER — Ambulatory Visit (HOSPITAL_COMMUNITY)
Admission: RE | Admit: 2024-06-06 | Discharge: 2024-06-06 | Disposition: A | Discharge status: Home / Self Care | Attending: Cardiology | Admitting: Cardiology

## 2024-06-06 DIAGNOSIS — I340 Nonrheumatic mitral (valve) insufficiency: Secondary | ICD-10-CM

## 2024-06-06 DIAGNOSIS — I10 Essential (primary) hypertension: Secondary | ICD-10-CM | POA: Insufficient documentation

## 2024-06-06 DIAGNOSIS — Z7901 Long term (current) use of anticoagulants: Secondary | ICD-10-CM | POA: Diagnosis not present

## 2024-06-06 DIAGNOSIS — I4719 Other supraventricular tachycardia: Secondary | ICD-10-CM

## 2024-06-06 DIAGNOSIS — I4892 Unspecified atrial flutter: Secondary | ICD-10-CM | POA: Insufficient documentation

## 2024-06-06 DIAGNOSIS — I4819 Other persistent atrial fibrillation: Secondary | ICD-10-CM | POA: Diagnosis not present

## 2024-06-06 DIAGNOSIS — K219 Gastro-esophageal reflux disease without esophagitis: Secondary | ICD-10-CM | POA: Diagnosis not present

## 2024-06-06 DIAGNOSIS — Z79899 Other long term (current) drug therapy: Secondary | ICD-10-CM | POA: Diagnosis not present

## 2024-06-06 DIAGNOSIS — I480 Paroxysmal atrial fibrillation: Secondary | ICD-10-CM

## 2024-06-06 DIAGNOSIS — I4891 Unspecified atrial fibrillation: Secondary | ICD-10-CM | POA: Diagnosis not present

## 2024-06-06 DIAGNOSIS — Z87891 Personal history of nicotine dependence: Secondary | ICD-10-CM | POA: Insufficient documentation

## 2024-06-06 DIAGNOSIS — I471 Supraventricular tachycardia, unspecified: Secondary | ICD-10-CM

## 2024-06-06 HISTORY — PX: A-FLUTTER ABLATION: EP1230

## 2024-06-06 HISTORY — PX: SVT ABLATION: EP1225

## 2024-06-06 LAB — CBC
HCT: 48.7 % (ref 39.0–52.0)
Hemoglobin: 16.3 g/dL (ref 13.0–17.0)
MCH: 29.6 pg (ref 26.0–34.0)
MCHC: 33.5 g/dL (ref 30.0–36.0)
MCV: 88.4 fL (ref 80.0–100.0)
Platelets: 298 K/uL (ref 150–400)
RBC: 5.51 MIL/uL (ref 4.22–5.81)
RDW: 12.5 % (ref 11.5–15.5)
WBC: 8.7 K/uL (ref 4.0–10.5)
nRBC: 0 % (ref 0.0–0.2)

## 2024-06-06 LAB — POCT ACTIVATED CLOTTING TIME: Activated Clotting Time: 268 s

## 2024-06-06 MED ORDER — ISOPROTERENOL HCL 0.2 MG/ML IJ SOLN
INTRAMUSCULAR | Status: AC
  Filled 2024-06-06: qty 5

## 2024-06-06 MED ORDER — ONDANSETRON HCL 4 MG/2ML IJ SOLN
INTRAMUSCULAR | Status: DC | PRN
  Administered 2024-06-06: 4 mg via INTRAVENOUS

## 2024-06-06 MED ORDER — ATROPINE SULFATE 1 MG/ML IV SOLN
INTRAVENOUS | Status: DC | PRN
  Administered 2024-06-06: 1 mg via INTRAVENOUS

## 2024-06-06 MED ORDER — COLCHICINE 0.6 MG PO TABS
0.6000 mg | ORAL_TABLET | Freq: Two times a day (BID) | ORAL | 0 refills | Status: AC
  Filled 2024-06-06: qty 10, 5d supply, fill #0

## 2024-06-06 MED ORDER — MIDAZOLAM HCL 2 MG/2ML IJ SOLN
INTRAMUSCULAR | Status: AC
  Filled 2024-06-06: qty 2

## 2024-06-06 MED ORDER — BUPIVACAINE HCL (PF) 0.25 % IJ SOLN
INTRAMUSCULAR | Status: DC | PRN
  Administered 2024-06-06: 20 mL

## 2024-06-06 MED ORDER — SODIUM CHLORIDE 0.9 % IV SOLN: 250.0000 mL | INTRAVENOUS | Status: DC | PRN

## 2024-06-06 MED ORDER — PROPOFOL 10 MG/ML IV BOLUS
INTRAVENOUS | Status: DC | PRN
  Administered 2024-06-06: 100 mg via INTRAVENOUS
  Administered 2024-06-06: 50 mg via INTRAVENOUS

## 2024-06-06 MED ORDER — ACETAMINOPHEN 500 MG PO TABS
1000.0000 mg | ORAL_TABLET | Freq: Once | ORAL | Status: AC
  Administered 2024-06-06: 1000 mg via ORAL
  Filled 2024-06-06: qty 2

## 2024-06-06 MED ORDER — PHENYLEPHRINE 80 MCG/ML (10ML) SYRINGE FOR IV PUSH (FOR BLOOD PRESSURE SUPPORT)
PREFILLED_SYRINGE | INTRAVENOUS | Status: DC | PRN
  Administered 2024-06-06: 160 ug via INTRAVENOUS
  Administered 2024-06-06: 120 ug via INTRAVENOUS

## 2024-06-06 MED ORDER — FENTANYL CITRATE (PF) 250 MCG/5ML IJ SOLN
INTRAMUSCULAR | Status: DC | PRN
  Administered 2024-06-06: 50 ug via INTRAVENOUS
  Administered 2024-06-06 (×2): 25 ug via INTRAVENOUS

## 2024-06-06 MED ORDER — HEPARIN SODIUM (PORCINE) 1000 UNIT/ML IJ SOLN
INTRAMUSCULAR | Status: DC | PRN
  Administered 2024-06-06: 16000 [IU] via INTRAVENOUS
  Administered 2024-06-06: 7000 [IU] via INTRAVENOUS

## 2024-06-06 MED ORDER — ONDANSETRON HCL 4 MG/2ML IJ SOLN: 4.0000 mg | Freq: Four times a day (QID) | INTRAMUSCULAR | Status: DC | PRN

## 2024-06-06 MED ORDER — PHENYLEPHRINE HCL-NACL 20-0.9 MG/250ML-% IV SOLN
INTRAVENOUS | Status: DC | PRN
  Administered 2024-06-06: 30 ug/min via INTRAVENOUS

## 2024-06-06 MED ORDER — SODIUM CHLORIDE 0.9% FLUSH: 3.0000 mL | Freq: Two times a day (BID) | INTRAVENOUS | Status: DC

## 2024-06-06 MED ORDER — SUGAMMADEX SODIUM 200 MG/2ML IV SOLN
INTRAVENOUS | Status: DC | PRN
  Administered 2024-06-06: 400 mg via INTRAVENOUS

## 2024-06-06 MED ORDER — PANTOPRAZOLE SODIUM 40 MG PO TBEC
40.0000 mg | DELAYED_RELEASE_TABLET | Freq: Every day | ORAL | 0 refills | Status: AC
  Filled 2024-06-06: qty 45, 45d supply, fill #0

## 2024-06-06 MED ORDER — BUPIVACAINE HCL (PF) 0.25 % IJ SOLN
INTRAMUSCULAR | Status: AC
  Filled 2024-06-06: qty 30

## 2024-06-06 MED ORDER — MIDAZOLAM HCL 2 MG/2ML IJ SOLN
INTRAMUSCULAR | Status: DC | PRN
  Administered 2024-06-06 (×2): 1 mg via INTRAVENOUS

## 2024-06-06 MED ORDER — SODIUM CHLORIDE 0.9 % IV SOLN: INTRAVENOUS | Status: DC

## 2024-06-06 MED ORDER — DILTIAZEM HCL ER COATED BEADS 120 MG PO CP24: 120.0000 mg | ORAL_CAPSULE | Freq: Two times a day (BID) | ORAL | Status: DC

## 2024-06-06 MED ORDER — LIDOCAINE HCL (CARDIAC) PF 100 MG/5ML IV SOSY
PREFILLED_SYRINGE | INTRAVENOUS | Status: DC | PRN
  Administered 2024-06-06: 100 mg via INTRAVENOUS

## 2024-06-06 MED ORDER — DEXAMETHASONE SODIUM PHOSPHATE 10 MG/ML IJ SOLN
INTRAMUSCULAR | Status: DC | PRN
  Administered 2024-06-06: 5 mg via INTRAVENOUS

## 2024-06-06 MED ORDER — LACTATED RINGERS IV SOLN: INTRAVENOUS | Status: DC | PRN

## 2024-06-06 MED ORDER — PROPOFOL 500 MG/50ML IV EMUL
INTRAVENOUS | Status: DC | PRN
  Administered 2024-06-06: 70 ug/kg/min via INTRAVENOUS

## 2024-06-06 MED ORDER — SODIUM CHLORIDE 0.9% FLUSH: 3.0000 mL | INTRAVENOUS | Status: DC | PRN

## 2024-06-06 MED ORDER — HEPARIN (PORCINE) IN NACL 1000-0.9 UT/500ML-% IV SOLN
INTRAVENOUS | Status: DC | PRN
  Administered 2024-06-06 (×3): 500 mL

## 2024-06-06 MED ORDER — ACETAMINOPHEN 325 MG PO TABS: 650.0000 mg | ORAL_TABLET | ORAL | Status: DC | PRN

## 2024-06-06 MED ORDER — ROCURONIUM BROMIDE 10 MG/ML (PF) SYRINGE
PREFILLED_SYRINGE | INTRAVENOUS | Status: DC | PRN
  Administered 2024-06-06: 70 mg via INTRAVENOUS

## 2024-06-06 MED ORDER — FENTANYL CITRATE (PF) 100 MCG/2ML IJ SOLN
INTRAMUSCULAR | Status: AC
  Filled 2024-06-06: qty 2

## 2024-06-06 MED ORDER — ISOPROTERENOL HCL 0.2 MG/ML IJ SOLN
INTRAVENOUS | Status: DC | PRN
  Administered 2024-06-06: 2 ug/min via INTRAVENOUS

## 2024-06-06 MED ORDER — COLCHICINE 0.6 MG PO TABS
0.6000 mg | ORAL_TABLET | Freq: Two times a day (BID) | ORAL | Status: DC
  Administered 2024-06-06: 0.6 mg via ORAL
  Filled 2024-06-06: qty 1

## 2024-06-06 MED ORDER — PANTOPRAZOLE SODIUM 40 MG PO TBEC
40.0000 mg | DELAYED_RELEASE_TABLET | Freq: Every day | ORAL | Status: DC
  Administered 2024-06-06: 40 mg via ORAL
  Filled 2024-06-06: qty 1

## 2024-06-06 MED ORDER — APIXABAN 5 MG PO TABS
5.0000 mg | ORAL_TABLET | Freq: Two times a day (BID) | ORAL | Status: DC
  Administered 2024-06-06: 5 mg via ORAL
  Filled 2024-06-06: qty 1

## 2024-06-06 MED ORDER — HEPARIN SODIUM (PORCINE) 1000 UNIT/ML IJ SOLN
INTRAMUSCULAR | Status: AC
  Filled 2024-06-06: qty 1

## 2024-06-06 NOTE — H&P (Signed)
 Electrophysiology Office Follow up Visit Note:     Date:  06/06/2024    ID:  Edward James, DOB June 08, 1941, MRN [id number]   PCP:  Glendia Shad, MD      [facility] Cardiologist:  Deatrice Cage, MD  [facility] Electrophysiologist:  OLE ONEIDA HOLTS, MD      Interval History:       Edward James is a 83 y.o. male who presents for a follow up visit.    The patient last saw Elvie March 09, 2024.  He has a history of paroxysmal atrial fibrillation, SVT, PVCs, hypertension.  I performed a catheter ablation for his atrial fibrillation on Jan 18, 2024.  During the procedure the pulmonary veins, posterior wall and CTI were ablated.  Episodes of SVT were ongoing and occurred mostly in the late morning.  He takes Eliquis  for stroke prophylaxis.   He is with his wife today in clinic.  He reports the episodes occur predominantly in the morning.  They are usually with exertion.  Once he settles down and sits down and takes a few deep breaths the episodes resolved.  Heart rates are typically in the 100-1 20s.  Presents for EP study with possible SVT/AF/AFL ablation. Procedures reviewed.     Objective Past medical, surgical, social and family history were reviewed.   ROS:   Please see the history of present illness.    All other systems reviewed and are negative.   EKGs/Labs/Other Studies Reviewed:     The following studies were reviewed today:   April 04, 2024 ZIO monitor 55 SVT episodes, longest 5 minutes and 51 seconds, average rate 125 bpm           Physical Exam:     VS:  BP 136/80 (BP Location: Left Arm, Patient Position: Sitting, Cuff Size: Large)   Pulse 60   Ht 6' 4 (1.93 m)   Wt 232 lb (105.2 kg)   SpO2 98%   BMI 28.24 kg/m         Wt Readings from Last 3 Encounters:  04/27/24 232 lb (105.2 kg)  03/28/24 231 lb (104.8 kg)  03/09/24 232 lb 6.4 oz (105.4 kg)      GEN: no distress CARD: RRR, No MRG RESP: No IWOB. CTAB.     Assessment ASSESSMENT:      1. SVT (supraventricular tachycardia) (HCC)   2. Persistent atrial fibrillation (HCC)   3. Atrial flutter, unspecified type (HCC)     PLAN:     In order of problems listed above:   #SVT #Atrial flutter #Atrial fibrillation Unclear mechanism.  Differential would include atrial flutter versus AVNRT.  Does not appear consistent with atrial fibrillation.  I discussed treatment options including continued conservative management versus pursuing repeat catheter ablation.  He would like to proceed with catheter ablation   Plan to start procedure under light sedation (fentanyl /Versed ).  Perform EP study.  If SVT induced, target with catheter ablation.  After EP study, plan to induce general anesthesia and checked pulm veins and posterior wall to make sure those are still intact from prior catheter ablation.   Discussed treatment options today for SVT/AF/AFL including antiarrhythmic drug therapy and ablation. Discussed risks, recovery and likelihood of success with each treatment strategy. Risk, benefits, and alternatives to EP study and ablation for afib were discussed. These risks include but are not limited to stroke, bleeding, vascular damage, tamponade, perforation, damage to the esophagus, lungs, phrenic nerve and other structures, pulmonary vein stenosis, worsening renal  function, coronary vasospasm and death.  He also understands there is a risk of AV nodal damage that would require PPM implant. Discussed potential need for repeat ablation procedures and antiarrhythmic drugs after an initial ablation. The patient understands these risk and wishes to proceed.  We will therefore proceed with catheter ablation at the next available time.  Carto, ICE, anesthesia are requested for the procedure.     No CT scan.   Hold diltiazem  for 5 days prior to the procedure   Continue Eliquis  for stroke prophylaxis   Presents for EP study and SVT/AF/AFL ablation.   Signed, Ole Holts, MD, [facility],  [facility] 06/06/2024 Electrophysiology [facility]

## 2024-06-06 NOTE — Anesthesia Procedure Notes (Signed)
 Procedure Name: Intubation Date/Time: 06/06/2024 12:27 PM  Performed by: Scherrie Mast, CRNAPre-anesthesia Checklist: Patient identified, Emergency Drugs available, Suction available, Patient being monitored and Timeout performed Patient Re-evaluated:Patient Re-evaluated prior to induction Oxygen Delivery Method: Circle system utilized Preoxygenation: Pre-oxygenation with 100% oxygen Induction Type: IV induction Ventilation: Mask ventilation without difficulty Laryngoscope Size: Glidescope and 3 Grade View: Grade I Tube type: Oral Tube size: 7.5 mm Number of attempts: 1 Airway Equipment and Method: Stylet and Video-laryngoscopy Placement Confirmation: ETT inserted through vocal cords under direct vision, positive ETCO2, CO2 detector and breath sounds checked- equal and bilateral Secured at: 22 cm Tube secured with: Tape Dental Injury: Teeth and Oropharynx as per pre-operative assessment

## 2024-06-06 NOTE — Transfer of Care (Signed)
 Immediate Anesthesia Transfer of Care Note  Patient: Edward James  Procedure(s) Performed: SVT ABLATION A-FLUTTER ABLATION  Patient Location: PACU  Anesthesia Type:MAC and General  Level of Consciousness: awake, alert , and oriented  Airway & Oxygen Therapy: Patient Spontanous Breathing and Patient connected to nasal cannula oxygen  Post-op Assessment: Report given to RN and Post -op Vital signs reviewed and stable  Post vital signs: Reviewed and stable  Last Vitals:  Vitals Value Taken Time  BP 102/73 06/06/24 14:05  Temp 37 C 06/06/24 13:58  Pulse 66 06/06/24 14:06  Resp 16 06/06/24 14:06  SpO2 97 % 06/06/24 14:06  Vitals shown include unfiled device data.  Last Pain:  Vitals:   06/06/24 1358  TempSrc: Oral  PainSc: 0-No pain         Complications: There were no known notable events for this encounter.

## 2024-06-06 NOTE — Anesthesia Preprocedure Evaluation (Addendum)
 Anesthesia Evaluation  Patient identified by MRN, date of birth, ID band Patient awake    Reviewed: Allergy & Precautions, NPO status , Patient's Chart, lab work & pertinent test results  History of Anesthesia Complications Negative for: history of anesthetic complications  Airway Mallampati: I  TM Distance: >3 FB Neck ROM: Full    Dental  (+) Dental Advisory Given, Teeth Intact   Pulmonary former smoker   Pulmonary exam normal breath sounds clear to auscultation       Cardiovascular hypertension, Pt. on medications (-) angina + dysrhythmias Atrial Fibrillation and Supra Ventricular Tachycardia + Valvular Problems/Murmurs (Mild-mod MR and TR) MR  Rhythm:Irregular Rate:Normal  Echo 04/2024  1. Left ventricular ejection fraction, by estimation, is 55 to 60%. The  left ventricle has normal function. The left ventricle has no regional  wall motion abnormalities. Left ventricular diastolic parameters are  consistent with Grade I diastolic  dysfunction (impaired relaxation).   2. Right ventricular systolic function is normal. The right ventricular  size is normal.   3. Left atrial size was severely dilated.   4. The mitral valve is normal in structure. Mild to moderate mitral valve  regurgitation. No evidence of mitral stenosis.   5. Tricuspid valve regurgitation is mild to moderate.   6. The aortic valve is normal in structure. Aortic valve regurgitation is  mild to moderate. No aortic stenosis is present.   7. The inferior vena cava is normal in size with greater than 50%  respiratory variability, suggesting right atrial pressure of 3 mmHg.     '24 ECHO: EF 55 to 60%. 1. LV EF 55.3 %. The left ventricle has normal function, no regional wall motion abnormalities. There is mild LVH.   2. RVF is normal. The right ventricular size is normal.   3. Left atrial size was moderately dilated.   4. Right atrial size was mild to moderately  dilated.   5. The mitral valve is normal in structure. Mild mitral valve regurgitation.   6. The aortic valve is tricuspid. Aortic valve regurgitation is mild.     Neuro/Psych negative neurological ROS     GI/Hepatic Neg liver ROS,GERD  Medicated and Controlled,,  Endo/Other  negative endocrine ROS    Renal/GU negative Renal ROS     Musculoskeletal   Abdominal   Peds  Hematology eliquis    Anesthesia Other Findings Prostate cancer  Reproductive/Obstetrics                              Anesthesia Physical Anesthesia Plan  ASA: 3  Anesthesia Plan: General   Post-op Pain Management: Tylenol  PO (pre-op)*   Induction: Intravenous  PONV Risk Score and Plan: 2 and Ondansetron  and Dexamethasone   Airway Management Planned: Oral ETT  Additional Equipment: None  Intra-op Plan:   Post-operative Plan: Extubation in OR  Informed Consent: I have reviewed the patients History and Physical, chart, labs and discussed the procedure including the risks, benefits and alternatives for the proposed anesthesia with the patient or authorized representative who has indicated his/her understanding and acceptance.     Dental advisory given  Plan Discussed with: CRNA  Anesthesia Plan Comments:         Anesthesia Quick Evaluation

## 2024-06-06 NOTE — Discharge Instructions (Signed)
 Cardiac Ablation, Care After  This sheet gives you information about how to care for yourself after your procedure. Your health care provider may also give you more specific instructions. If you have problems or questions, contact your health care provider. What can I expect after the procedure? After the procedure, it is common to have: Bruising around your puncture site. Tenderness around your puncture site. Skipped heartbeats. If you had an atrial fibrillation ablation, you may have atrial fibrillation during the first several months after your procedure.  Tiredness (fatigue).  Follow these instructions at home: Puncture site care  Follow instructions from your health care provider about how to take care of your puncture site. Make sure you: If present, leave stitches (sutures), skin glue, or adhesive strips in place. These skin closures may need to stay in place for up to 2 weeks. If adhesive strip edges start to loosen and curl up, you may trim the loose edges. Do not remove adhesive strips completely unless your health care provider tells you to do that. If a large square bandage is present, this may be removed 24 hours after surgery.  Check your puncture site every day for signs of infection. Check for: Redness, swelling, or pain. Fluid or blood. If your puncture site starts to bleed, lie down on your back, apply firm pressure to the area, and contact your health care provider. Warmth. Pus or a bad smell. A pea or marble sized lump/knot at the site is normal and can take up to three months to resolve.  Driving Do not drive for at least 4 days after your procedure or however long your health care provider recommends. (Do not resume driving if you have previously been instructed not to drive for other health reasons.) Do not drive or use heavy machinery while taking prescription pain medicine. Activity Avoid activities that take a lot of effort for at least 7 days after your  procedure. Do not lift anything that is heavier than 5 lb (4.5 kg) for one week.  No sexual activity for 1 week.  Return to your normal activities as told by your health care provider. Ask your health care provider what activities are safe for you. General instructions Take over-the-counter and prescription medicines only as told by your health care provider. Do not use any products that contain nicotine or tobacco, such as cigarettes and e-cigarettes. If you need help quitting, ask your health care provider. You may shower after 24 hours, but Do not take baths, swim, or use a hot tub for 1 week.  Do not drink alcohol for 24 hours after your procedure. Keep all follow-up visits as told by your health care provider. This is important. Contact a health care provider if: You have redness, mild swelling, or pain around your puncture site. You have fluid or blood coming from your puncture site that stops after applying firm pressure to the area. Your puncture site feels warm to the touch. You have pus or a bad smell coming from your puncture site. You have a fever. You have chest pain or discomfort that spreads to your neck, jaw, or arm. You have chest pain that is worse with lying on your back or taking a deep breath. You are sweating a lot. You feel nauseous. You have a fast or irregular heartbeat. You have shortness of breath. You are dizzy or light-headed and feel the need to lie down. You have pain or numbness in the arm or leg closest to your puncture site.  Get help right away if: Your puncture site suddenly swells. Your puncture site is bleeding and the bleeding does not stop after applying firm pressure to the area. These symptoms may represent a serious problem that is an emergency. Do not wait to see if the symptoms will go away. Get medical help right away. Call your local emergency services (911 in the [place] Do not drive yourself to the hospital. Summary After the procedure, it  is normal to have bruising and tenderness at the puncture site in your groin, neck, or forearm. Check your puncture site every day for signs of infection. Get help right away if your puncture site is bleeding and the bleeding does not stop after applying firm pressure to the area. This is a medical emergency. This information is not intended to replace advice given to you by your health care provider. Make sure you discuss any questions you have with your health care provider. Femoral Site Care This sheet gives you information about how to care for yourself after your procedure. Your health care provider may also give you more specific instructions. If you have problems or questions, contact your health care provider. What can I expect after the procedure?  After the procedure, it is common to have: Bruising that usually fades within 1-2 weeks. Tenderness at the site. Follow these instructions at home: Wound care Follow instructions from your health care provider about how to take care of your insertion site. Make sure you: Wash your hands with soap and water before you change your bandage (dressing). If soap and water are not available, use hand sanitizer. Remove your dressing as told by your health care provider. 24 hours Do not take baths, swim, or use a hot tub until your health care provider approves. You may shower 24-48 hours after the procedure or as told by your health care provider. Gently wash the site with plain soap and water. Pat the area dry with a clean towel. Do not rub the site. This may cause bleeding. Do not apply powder or lotion to the site. Keep the site clean and dry. Check your femoral site every day for signs of infection. Check for: Redness, swelling, or pain. Fluid or blood. Warmth. Pus or a bad smell. Activity For the first 2-3 days after your procedure, or as long as directed: Avoid climbing stairs as much as possible. Do not squat. Do not lift anything  that is heavier than 10 lb (4.5 kg), or the limit that you are told, until your health care provider says that it is safe. For 5 days Rest as directed. Avoid sitting for a long time without moving. Get up to take short walks every 1-2 hours. Do not drive for 24 hours if you were given a medicine to help you relax (sedative). General instructions Take over-the-counter and prescription medicines only as told by your health care provider. Keep all follow-up visits as told by your health care provider. This is important. Contact a health care provider if you have: A fever or chills. You have redness, swelling, or pain around your insertion site. Get help right away if: The catheter insertion area swells very fast. You pass out. You suddenly start to sweat or your skin gets clammy. The catheter insertion area is bleeding, and the bleeding does not stop when you hold steady pressure on the area. The area near or just beyond the catheter insertion site becomes pale, cool, tingly, or numb. These symptoms may represent a serious problem that  is an emergency. Do not wait to see if the symptoms will go away. Get medical help right away. Call your local emergency services (911 in the [place] Do not drive yourself to the hospital. Summary After the procedure, it is common to have bruising that usually fades within 1-2 weeks. Check your femoral site every day for signs of infection. Do not lift anything that is heavier than 10 lb (4.5 kg), or the limit that you are told, until your health care provider says that it is safe. This information is not intended to replace advice given to you by your health care provider. Make sure you discuss any questions you have with your health care provider. Document Revised: 08/31/2017 Document Reviewed: 08/31/2017 Elsevier Patient Education  2020 [employer].

## 2024-06-06 NOTE — Progress Notes (Signed)
 Patient ambulated to the bathroom and voided. No bleeding or hematoma noted to bilateral groins. Bilateral groin sites clean, dry, and intact. Patient and wife received discharge instructions with no concerns voiced. Patient received medications from [facility] pharmacy. Provider has been in to see the patient.

## 2024-06-07 ENCOUNTER — Telehealth (HOSPITAL_COMMUNITY): Payer: Self-pay

## 2024-06-07 ENCOUNTER — Encounter (HOSPITAL_COMMUNITY): Payer: Self-pay | Admitting: Cardiology

## 2024-06-07 NOTE — Anesthesia Postprocedure Evaluation (Signed)
 Anesthesia Post Note  Patient: Edward James  Procedure(s) Performed: SVT ABLATION A-FLUTTER ABLATION     Patient location during evaluation: PACU Anesthesia Type: General Level of consciousness: sedated and patient cooperative Pain management: pain level controlled Vital Signs Assessment: post-procedure vital signs reviewed and stable Respiratory status: spontaneous breathing Cardiovascular status: stable Anesthetic complications: no   There were no known notable events for this encounter.  Last Vitals:  Vitals:   06/06/24 1630 06/06/24 1700  BP: 123/74 121/74  Pulse: (!) 58 63  Resp: 17 20  Temp:    SpO2: 98% 96%    Last Pain:  Vitals:   06/06/24 1457  TempSrc:   PainSc: 0-No pain                 Norleen Pope

## 2024-06-07 NOTE — Telephone Encounter (Signed)
 Spoke with patient to complete post procedure follow up call.  Patient reports no complications with groin sites.   Instructions reviewed with patient:  Remove large bandage at puncture site after 24 hours. It is normal to have bruising, tenderness, mild swelling, and a pea or marble sized lump/knot at the groin site which can take up to three months to resolve.  Get help right away if you notice sudden swelling at the puncture site.  Check your puncture site every day for signs of infection: fever, redness, swelling, pus drainage, warmth, foul odor or excessive pain. If this occurs, please call [phone], to speak with the RN Navigator. Get help right away if your puncture site is bleeding and the bleeding does not stop after applying firm pressure to the area.  You may continue to have skipped beats/ atrial fluttering during the first several months after your procedure.  It is very important not to miss any doses of your blood thinner Eliquis .    You will follow up with the APP 4 weeks after your procedure.   Patient verbalized understanding to all instructions provided.

## 2024-06-12 ENCOUNTER — Encounter: Payer: Self-pay | Admitting: Cardiology

## 2024-06-13 NOTE — Progress Notes (Unsigned)
  Electrophysiology Office Note:   Date:  06/14/2024  ID:  Sartaj Hoskin, DOB 03-Jul-1941, MRN [id number]  Primary Cardiologist: Deatrice Cage, MD Electrophysiologist: OLE ONEIDA HOLTS, MD   Electrophysiologist:  OLE ONEIDA HOLTS, MD  Electrophysiology APP:  Riddle, Suzann, NP      History of Present Illness:   Edward James is a 83 y.o. male with h/o PAF, SVT, PVCs and HTN seen today for routine electrophysiology follow-up s/p Ablation.  S/p Ablation 10/6. Called to report bruising and edema on 10/13.  Pt reports the swelling has improved but the coloring has gotten worse.  Had mild discomfort around the R groin site, and has had no swelling or bruising on the left side.  Otherwise, he is doing well with no chest pain, N/V, diarrhea, bleeding, or syncope.   Review of systems complete and found to be negative unless listed in HPI.   EP Information / Studies Reviewed:    EKG is not ordered today. EKG from 06/06/2024 reviewed which showed NSR at 70 bpm       Arrhythmia/Device History S/p PVI/CTI 12/2023 S/p Redo PVI and posterior wall + slow pathway for typical AVNRT 06/06/2024   Amiodarone  - ineffective   Physical Exam:   VS:  BP (!) 140/86   Pulse 67   Ht 6' 4 (1.93 m)   Wt 232 lb (105.2 kg)   SpO2 98%   BMI 28.24 kg/m    Wt Readings from Last 3 Encounters:  06/14/24 232 lb (105.2 kg)  06/06/24 232 lb (105.2 kg)  05/24/24 232 lb 2 oz (105.3 kg)     GEN: No acute distress NECK: No JVD; No carotid bruits CARDIAC: Regular rate and rhythm, no murmurs, rubs, gallops RESPIRATORY:  Clear to auscultation without rales, wheezing or rhonchi  ABDOMEN: Soft, non-tender, non-distended EXTREMITIES:  No edema; No deformity   ASSESSMENT AND PLAN:    SVT AFL Persistent AF S/p ablation 12/2023 with redo+SVT 06/06/2024 R Groin site is stable, and soft. Non-tender. No obvious hematoma, though pt says at first he did have one.  Pt has soft dependent ecchymosis and mild edema down  to his R foot. 1+ ankle edema. No pitting edema in thigh, but this is where most of his ecchymosis has collected. No tenderness.   Encouraged rest and elevation. Can also consider compression, but he finds it generally uncomfortable.    Follow up with in 2 weeks as scheduled. Sooner if any drainage, bleeding, discharge, or new pain.   Signed, Ozell Prentice Passey, PA-C

## 2024-06-13 NOTE — Telephone Encounter (Signed)
 Per Dr. Cindie, patient needs an appointment with APP to evaluate his groin/leg. Spoke with the patient and appointment has been made.

## 2024-06-14 ENCOUNTER — Encounter: Payer: Self-pay | Admitting: Student

## 2024-06-14 ENCOUNTER — Ambulatory Visit: Attending: Student | Admitting: Student

## 2024-06-14 VITALS — BP 140/86 | HR 67 | Ht 76.0 in | Wt 232.0 lb

## 2024-06-14 DIAGNOSIS — I4892 Unspecified atrial flutter: Secondary | ICD-10-CM | POA: Diagnosis not present

## 2024-06-14 DIAGNOSIS — I471 Supraventricular tachycardia, unspecified: Secondary | ICD-10-CM

## 2024-06-14 DIAGNOSIS — I480 Paroxysmal atrial fibrillation: Secondary | ICD-10-CM | POA: Diagnosis not present

## 2024-06-14 NOTE — Patient Instructions (Signed)
 Medication Instructions:  Your physician recommends that you continue on your current medications as directed. Please refer to the Current Medication list given to you today.  *If you need a refill on your cardiac medications before your next appointment, please call your pharmacy*  Lab Work: None ordered If you have labs (blood work) drawn today and your tests are completely normal, you will receive your results only by: MyChart Message (if you have MyChart) OR A paper copy in the mail If you have any lab test that is abnormal or we need to change your treatment, we will call you to review the results.  Follow-Up: At [facility], you and your health needs are our priority.  As part of our continuing mission to provide you with exceptional heart care, our providers are all part of one team.  This team includes your primary Cardiologist (physician) and Advanced Practice Providers or APPs (Physician Assistants and Nurse Practitioners) who all work together to provide you with the care you need, when you need it.  Your next appointment:   As scheduled  Provider:   Suzann Riddle, NP

## 2024-07-01 ENCOUNTER — Ambulatory Visit: Attending: Cardiology | Admitting: Cardiology

## 2024-07-01 ENCOUNTER — Encounter: Payer: Self-pay | Admitting: Cardiology

## 2024-07-01 VITALS — BP 136/76 | HR 57 | Ht 76.0 in | Wt 233.4 lb

## 2024-07-01 DIAGNOSIS — I471 Supraventricular tachycardia, unspecified: Secondary | ICD-10-CM | POA: Diagnosis not present

## 2024-07-01 DIAGNOSIS — I4819 Other persistent atrial fibrillation: Secondary | ICD-10-CM | POA: Diagnosis not present

## 2024-07-01 DIAGNOSIS — I4892 Unspecified atrial flutter: Secondary | ICD-10-CM

## 2024-07-01 DIAGNOSIS — D6869 Other thrombophilia: Secondary | ICD-10-CM | POA: Diagnosis not present

## 2024-07-01 NOTE — Patient Instructions (Signed)
 Medication Instructions:  Your physician recommends that you continue on your current medications as directed. Please refer to the Current Medication list given to you today.  *If you need a refill on your cardiac medications before your next appointment, please call your pharmacy*  Lab Work: No labs ordered today  If you have labs (blood work) drawn today and your tests are completely normal, you will receive your results only by: MyChart Message (if you have MyChart) OR A paper copy in the mail If you have any lab test that is abnormal or we need to change your treatment, we will call you to review the results.  Testing/Procedures: No test ordered today   Follow-Up: At [facility], you and your health needs are our priority.  As part of our continuing mission to provide you with exceptional heart care, our providers are all part of one team.  This team includes your primary Cardiologist (physician) and Advanced Practice Providers or APPs (Physician Assistants and Nurse Practitioners) who all work together to provide you with the care you need, when you need it.  Your next appointment:   4 - 6 week(s)  Provider:   Suzann Riddle, NP   We recommend signing up for the patient portal called MyChart.  Sign up information is provided on this After Visit Summary.  MyChart is used to connect with patients for Virtual Visits (Telemedicine).  Patients are able to view lab/test results, encounter notes, upcoming appointments, etc.  Non-urgent messages can be sent to your provider as well.   To learn more about what you can do with MyChart, go to [email or web address].

## 2024-07-01 NOTE — Progress Notes (Signed)
 Electrophysiology Clinic Note    Date:  07/01/2024  Patient ID:  Edward James [birth year], MRN [id number] PCP:  Edward Shad, MD  Cardiologist:  Edward Cage, MD  Electrophysiologist:  Edward ONEIDA HOLTS, MD  Electrophysiology APP:  Edward Traynor, NP     Discussed the use of AI scribe software for clinical note transcription with the patient, who gave verbal consent to proceed.   Patient Profile    Chief Complaint: 1 month ablation follow-up  History of Present Illness: Edward James is a 83 y.o. male with PMH notable for parox AFib, SVT, PVCs, HTN, prostate cancer ; seen today for Edward ONEIDA HOLTS, MD for routine electrophysiology follow-up s/p Ablation.  He is s/p AF ablation w isolation of pulm veins, posterior wall, and CTI for flutter on 01/18/2024 by Dr. Holts.  I saw him in follow-up where he continued to have daily SVT episodes.  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   He was seen for urgent appt 10/14 for R groin bruising and edema. No hematoma noted, recommended rest, compression, and elevattion.   On follow-up today, his R lower leg edema continues to slowly improve. He has one small area of bruising at great toe. He admits to not resting or elevating leg as much as he should.  He has not had any recurrence of palpitation episodes since ablation. He denies cheset pain, chest pressure, SOB, dizziness or presyncope.   He continue to take dilt and eliquis  BID, no bleeding concerns via GI/GU.    Arrhythmia/Device History S/p PVI/CTI 12/2023 S/p Redo PVI and posterior wall + slow pathway for typical AVNRT 06/06/2024   Amiodarone  - ineffective    ROS:  Please see the history of present illness. All other systems are reviewed and otherwise negative.    Physical Exam    VS:  BP 136/76   Pulse (!) 57   Ht 6' 4 (1.93 m)   Wt 233 lb 6.4 oz (105.9 kg)   SpO2 100%   BMI 28.41 kg/m  BMI: Body mass  index is 28.41 kg/m.           Wt Readings from Last 3 Encounters:  07/01/24 233 lb 6.4 oz (105.9 kg)  06/14/24 232 lb (105.2 kg)  06/06/24 232 lb (105.2 kg)     GEN- The patient is well appearing, alert and oriented x 3 today.   Lungs- Clear to ausculation bilaterally, normal work of breathing.  Heart- Regular rate and rhythm with rare ectopy, no murmurs, rubs or gallops Extremities- No LLE peripheral edema, 1-2+ RLE edema, warm, dry   Studies Reviewed   Previous EP, cardiology notes.    EKG is ordered. Personal review of EKG from today shows:    EKG Interpretation Date/Time:  Friday July 01 2024 14:01:09 EDT Ventricular Rate:  57 PR Interval:  212 QRS Duration:  114 QT Interval:  444 QTC Calculation: 432 R Axis:   -50  Text Interpretation: Sinus bradycardia  with blocked PAC with 1st degree A-V block Left axis deviation Reconfirmed by Edward James [phone]) on 07/01/2024 2:35:11 PM    TTE, 04/21/2024  1. Left ventricular ejection fraction, by estimation, is 55 to 60%. The left ventricle has normal function. The left ventricle has no regional wall motion abnormalities. Left ventricular diastolic parameters are consistent with Grade I diastolic dysfunction (impaired relaxation).   2. Right ventricular systolic function is normal. The right ventricular size is  normal.   3. Left atrial size was severely dilated.   4. The mitral valve is normal in structure. Mild to moderate mitral valve regurgitation. No evidence of mitral stenosis.   5. Tricuspid valve regurgitation is mild to moderate.   6. The aortic valve is normal in structure. Aortic valve regurgitation is mild to moderate. No aortic stenosis is present.   7. The inferior vena cava is normal in size with greater than 50% respiratory variability, suggesting right atrial pressure of 3 mmHg.   Long term monitor, 03/31/2024 HR 40 to 174, average 56. 1 nonsustained VT lasting 4 beats. 55 nonsustained SVT, longest  51sec with an average rate 125 bpm. 2 pauses, longest 3.8 seconds, nocturnal Occasional supraventricular ectopy. Rare ventricular ectopy. No sustained arrhythmias. No atrial fibrillation.  TTE, 05/08/2023  1. Left ventricular ejection fraction, by estimation, is 55 to 60%. Left ventricular ejection fraction by 2D MOD biplane is 55.3 %. The left ventricle has normal function. The left ventricle has no regional wall motion abnormalities. There is mild left ventricular hypertrophy. Left ventricular diastolic parameters are indeterminate.   2. Right ventricular systolic function is normal. The right ventricular size is normal.   3. Left atrial size was moderately dilated.   4. Right atrial size was mild to moderately dilated.   5. The mitral valve is normal in structure. Mild mitral valve regurgitation.   6. The aortic valve is tricuspid. Aortic valve regurgitation is mild.   7. Aortic dilatation noted. There is mild dilatation of the aortic root, measuring 40mm.   8. The inferior vena cava is normal in size with greater than 50% respiratory variability, suggesting right atrial pressure of 3 mmHg.        Assessment and Plan     #) SVT #) afib, Aflutter #) PAC S/p AFib, aflutter ablation 12/2023; s/p redo afib, SVT ablation 06/2024 Significant improvement in palpitation, SVT episodes since ablation earlier this month EKG with blocked PAC with <2sec sinus pause following PAC Patient is asymptomatic with improvement exercise tolerance after ablation Continue 120mg  dilt BID with close follow-up R groin access site and RLE edema continue to slowly improve. Encourage rest, elevation and compression.    #) Hypercoag d/t afib CHA2DS2-VASc Score = at least 3 [CHF History: 0, HTN History: 1, Diabetes History: 0, Stroke History: 0, Vascular Disease History: 0, Age Score: 2, Gender Score: 0].  Therefore, the patient's annual risk of stroke is 3.2 %.    Stroke ppx - 5mg  eliquis  BID, appropriately  dosed No bleeding concerns         Current medicines are reviewed at length with the patient today.   The patient does not have concerns regarding his medicines.  The following changes were made today:  none  Labs/ tests ordered today include:  Orders Placed This Encounter  Procedures   EKG 12-Lead     Disposition: Follow up with Dr. Cindie or EP APP in 4 weeks   Signed, Emmett Bracknell, NP  07/01/24  3:13 PM  Electrophysiology [facility]

## 2024-07-26 DIAGNOSIS — D2271 Melanocytic nevi of right lower limb, including hip: Secondary | ICD-10-CM | POA: Diagnosis not present

## 2024-07-26 DIAGNOSIS — L821 Other seborrheic keratosis: Secondary | ICD-10-CM | POA: Diagnosis not present

## 2024-07-26 DIAGNOSIS — D2272 Melanocytic nevi of left lower limb, including hip: Secondary | ICD-10-CM | POA: Diagnosis not present

## 2024-07-26 DIAGNOSIS — D2261 Melanocytic nevi of right upper limb, including shoulder: Secondary | ICD-10-CM | POA: Diagnosis not present

## 2024-07-26 DIAGNOSIS — Z08 Encounter for follow-up examination after completed treatment for malignant neoplasm: Secondary | ICD-10-CM | POA: Diagnosis not present

## 2024-07-26 DIAGNOSIS — L570 Actinic keratosis: Secondary | ICD-10-CM | POA: Diagnosis not present

## 2024-07-26 DIAGNOSIS — Z85828 Personal history of other malignant neoplasm of skin: Secondary | ICD-10-CM | POA: Diagnosis not present

## 2024-07-26 DIAGNOSIS — D2262 Melanocytic nevi of left upper limb, including shoulder: Secondary | ICD-10-CM | POA: Diagnosis not present

## 2024-07-26 DIAGNOSIS — D225 Melanocytic nevi of trunk: Secondary | ICD-10-CM | POA: Diagnosis not present

## 2024-08-04 NOTE — Progress Notes (Signed)
 Electrophysiology Clinic Note    Date:  08/05/2024  Patient ID:  Edward James, Edward James 04-16-1941, MRN [id number] PCP:  Glendia Shad, MD  Cardiologist:  Deatrice Cage, MD  Electrophysiologist:  OLE ONEIDA HOLTS, MD  Electrophysiology APP:  Zella Dewan, NP     Discussed the use of AI scribe software for clinical note transcription with the patient, who gave verbal consent to proceed.   Patient Profile    Chief Complaint: 1 month ablation follow-up  History of Present Illness: Edward James is a 83 y.o. male with PMH notable for parox AFib, SVT, PVCs, HTN, prostate cancer ; seen today for OLE ONEIDA HOLTS, MD for routine electrophysiology follow-up s/p Ablation.  He is s/p AF ablation w isolation of pulm veins, posterior wall, and CTI for flutter on 01/18/2024 by Dr. Holts.   I saw him in follow-up where he continued to have daily SVT episodes.  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   He was seen for urgent appt 10/14 for R groin bruising and edema. No hematoma noted, recommended rest, compression, and elevattion. I saw him 10/31 where R groin was continuing to heal. SVT was significantly improved.   On follow-up today, he is doing very well.  He has had no further palpitation episodes he continues to take diltiazem  120 twice daily along with his Eliquis  twice daily.  He has intermittent bruising to hands and forearms but no significant bleeding concerns. He continues to have intermittent right leg swelling.  Worsened at the end of the day if he has not elevated or rested.  It is overall improving.     Arrhythmia/Device History Amiodarone  - ineffective    ROS:  Please see the history of present illness. All other systems are reviewed and otherwise negative.    Physical Exam    VS:  BP 138/72 (BP Location: Left Arm, Patient Position: Sitting, Cuff Size: Normal)   Pulse (!) 58   Ht 6' 4 (1.93 m)   Wt 238 lb (108  kg)   SpO2 96%   BMI 28.97 kg/m  BMI: Body mass index is 28.97 kg/m.           Wt Readings from Last 3 Encounters:  08/05/24 238 lb (108 kg)  07/01/24 233 lb 6.4 oz (105.9 kg)  06/14/24 232 lb (105.2 kg)     GEN- The patient is well appearing, alert and oriented x 3 today.   Lungs- Clear to ausculation bilaterally, normal work of breathing.  Heart- Regular rate and rhythm with rare ectopy, no murmurs, rubs or gallops Extremities- No LLE peripheral edema, 1-2+ RLE edema, warm, dry   Studies Reviewed   Previous EP, cardiology notes.    EKG is ordered. Personal review of EKG from today shows:    EKG Interpretation Date/Time:  Friday August 05 2024 10:34:28 EST Ventricular Rate:  58 PR Interval:  224 QRS Duration:  106 QT Interval:  436 QTC Calculation: 428 R Axis:   -54  Text Interpretation: Sinus bradycardia with 1st degree A-V block Left anterior fascicular block Confirmed by Sergey Ishler [phone]) on 08/05/2024 10:50:57 AM    TTE, 04/21/2024  1. Left ventricular ejection fraction, by estimation, is 55 to 60%. The left ventricle has normal function. The left ventricle has no regional wall motion abnormalities. Left ventricular diastolic parameters are consistent with Grade I diastolic dysfunction (impaired relaxation).   2. Right ventricular systolic function is normal. The  right ventricular size is normal.   3. Left atrial size was severely dilated.   4. The mitral valve is normal in structure. Mild to moderate mitral valve regurgitation. No evidence of mitral stenosis.   5. Tricuspid valve regurgitation is mild to moderate.   6. The aortic valve is normal in structure. Aortic valve regurgitation is mild to moderate. No aortic stenosis is present.   7. The inferior vena cava is normal in size with greater than 50% respiratory variability, suggesting right atrial pressure of 3 mmHg.   Long term monitor, 03/31/2024 HR 40 to 174, average 56. 1 nonsustained VT lasting 4  beats. 55 nonsustained SVT, longest 51sec with an average rate 125 bpm. 2 pauses, longest 3.8 seconds, nocturnal Occasional supraventricular ectopy. Rare ventricular ectopy. No sustained arrhythmias. No atrial fibrillation.  TTE, 05/08/2023  1. Left ventricular ejection fraction, by estimation, is 55 to 60%. Left ventricular ejection fraction by 2D MOD biplane is 55.3 %. The left ventricle has normal function. The left ventricle has no regional wall motion abnormalities. There is mild left ventricular hypertrophy. Left ventricular diastolic parameters are indeterminate.   2. Right ventricular systolic function is normal. The right ventricular size is normal.   3. Left atrial size was moderately dilated.   4. Right atrial size was mild to moderately dilated.   5. The mitral valve is normal in structure. Mild mitral valve regurgitation.   6. The aortic valve is tricuspid. Aortic valve regurgitation is mild.   7. Aortic dilatation noted. There is mild dilatation of the aortic root, measuring 40mm.   8. The inferior vena cava is normal in size with greater than 50% respiratory variability, suggesting right atrial pressure of 3 mmHg.    Assessment and Plan     #) SVT #) afib, Aflutter #) PAC S/p AFib, aflutter ablation 12/2023; s/p redo afib, SVT ablation 06/2024 No reoccurance of SVT or AFib  Continue 120mg  dilt BID with close follow-up  #) Hypercoag d/t afib CHA2DS2-VASc Score = at least 3 [CHF History: 0, HTN History: 1, Diabetes History: 0, Stroke History: 0, Vascular Disease History: 0, Age Score: 2, Gender Score: 0].  Therefore, the patient's annual risk of stroke is 3.2 %.    Stroke ppx - 5mg  eliquis  BID, appropriately dosed No bleeding concerns       Current medicines are reviewed at length with the patient today.   The patient does not have concerns regarding his medicines.  The following changes were made today:  none  Labs/ tests ordered today include:  Orders  Placed This Encounter  Procedures   EKG 12-Lead     Disposition: Follow up with Dr. Kennyth or EP APP in 12 months   Signed, Kadarious Dikes, NP  08/05/24  12:12 PM  Electrophysiology [facility]

## 2024-08-05 ENCOUNTER — Ambulatory Visit: Attending: Cardiology | Admitting: Cardiology

## 2024-08-05 VITALS — BP 138/72 | HR 58 | Ht 76.0 in | Wt 238.0 lb

## 2024-08-05 DIAGNOSIS — D6869 Other thrombophilia: Secondary | ICD-10-CM

## 2024-08-05 DIAGNOSIS — I4892 Unspecified atrial flutter: Secondary | ICD-10-CM | POA: Diagnosis not present

## 2024-08-05 DIAGNOSIS — I471 Supraventricular tachycardia, unspecified: Secondary | ICD-10-CM

## 2024-08-05 DIAGNOSIS — I4819 Other persistent atrial fibrillation: Secondary | ICD-10-CM | POA: Diagnosis not present

## 2024-08-05 NOTE — Patient Instructions (Signed)
 Medication Instructions:  Your physician recommends that you continue on your current medications as directed. Please refer to the Current Medication list given to you today.  *If you need a refill on your cardiac medications before your next appointment, please call your pharmacy*  Lab Work: No labs ordered today    Testing/Procedures: No test ordered today   Follow-Up: At [facility], you and your health needs are our priority.  As part of our continuing mission to provide you with exceptional heart care, our providers are all part of one team.  This team includes your primary Cardiologist (physician) and Advanced Practice Providers or APPs (Physician Assistants and Nurse Practitioners) who all work together to provide you with the care you need, when you need it.  Your next appointment:   1 year(s)  Provider:   Suzann Riddle, NP

## 2024-09-05 ENCOUNTER — Ambulatory Visit: Admitting: Cardiology

## 2024-09-23 ENCOUNTER — Other Ambulatory Visit (INDEPENDENT_AMBULATORY_CARE_PROVIDER_SITE_OTHER)

## 2024-09-23 DIAGNOSIS — R739 Hyperglycemia, unspecified: Secondary | ICD-10-CM | POA: Diagnosis not present

## 2024-09-23 DIAGNOSIS — E7800 Pure hypercholesterolemia, unspecified: Secondary | ICD-10-CM | POA: Diagnosis not present

## 2024-09-23 DIAGNOSIS — I480 Paroxysmal atrial fibrillation: Secondary | ICD-10-CM

## 2024-09-23 DIAGNOSIS — I471 Supraventricular tachycardia, unspecified: Secondary | ICD-10-CM

## 2024-09-23 LAB — LIPID PANEL
Cholesterol: 119 mg/dL (ref 28–200)
HDL: 50.6 mg/dL
LDL Cholesterol: 55 mg/dL (ref 10–99)
NonHDL: 68.12
Total CHOL/HDL Ratio: 2
Triglycerides: 65 mg/dL (ref 10.0–149.0)
VLDL: 13 mg/dL (ref 0.0–40.0)

## 2024-09-23 LAB — CBC WITH DIFFERENTIAL/PLATELET
Basophils Absolute: 0 K/uL (ref 0.0–0.1)
Basophils Relative: 0.5 % (ref 0.0–3.0)
Eosinophils Absolute: 0.2 K/uL (ref 0.0–0.7)
Eosinophils Relative: 2.3 % (ref 0.0–5.0)
HCT: 42.2 % (ref 39.0–52.0)
Hemoglobin: 14.2 g/dL (ref 13.0–17.0)
Lymphocytes Relative: 30.8 % (ref 12.0–46.0)
Lymphs Abs: 2.2 K/uL (ref 0.7–4.0)
MCHC: 33.7 g/dL (ref 30.0–36.0)
MCV: 85 fl (ref 78.0–100.0)
Monocytes Absolute: 0.7 K/uL (ref 0.1–1.0)
Monocytes Relative: 9.4 % (ref 3.0–12.0)
Neutro Abs: 4.1 K/uL (ref 1.4–7.7)
Neutrophils Relative %: 57 % (ref 43.0–77.0)
Platelets: 234 K/uL (ref 150.0–400.0)
RBC: 4.97 Mil/uL (ref 4.22–5.81)
RDW: 13.2 % (ref 11.5–15.5)
WBC: 7.2 K/uL (ref 4.0–10.5)

## 2024-09-23 LAB — BASIC METABOLIC PANEL WITH GFR
BUN: 18 mg/dL (ref 6–23)
CO2: 29 meq/L (ref 19–32)
Calcium: 9.1 mg/dL (ref 8.4–10.5)
Chloride: 106 meq/L (ref 96–112)
Creatinine, Ser: 0.8 mg/dL (ref 0.40–1.50)
GFR: 82.82 mL/min
Glucose, Bld: 92 mg/dL (ref 70–99)
Potassium: 4 meq/L (ref 3.5–5.1)
Sodium: 140 meq/L (ref 135–145)

## 2024-09-23 LAB — HEMOGLOBIN A1C: Hgb A1c MFr Bld: 5.8 % (ref 4.6–6.5)

## 2024-09-24 ENCOUNTER — Ambulatory Visit: Payer: Self-pay | Admitting: Internal Medicine

## 2024-09-26 ENCOUNTER — Encounter: Admitting: Internal Medicine

## 2024-10-26 ENCOUNTER — Encounter: Admitting: Internal Medicine
